# Patient Record
Sex: Female | Born: 1935 | Hispanic: No | Smoking: Never smoker
Health system: Southern US, Community
[De-identification: ages and names within clinical notes are randomized; demographics above are authoritative.]

## PROBLEM LIST (undated history)

## (undated) DIAGNOSIS — T8859XA Other complications of anesthesia, initial encounter: Secondary | ICD-10-CM

## (undated) DIAGNOSIS — M810 Age-related osteoporosis without current pathological fracture: Secondary | ICD-10-CM

## (undated) DIAGNOSIS — S72009A Fracture of unspecified part of neck of unspecified femur, initial encounter for closed fracture: Secondary | ICD-10-CM

## (undated) DIAGNOSIS — T4145XA Adverse effect of unspecified anesthetic, initial encounter: Secondary | ICD-10-CM

## (undated) DIAGNOSIS — H269 Unspecified cataract: Secondary | ICD-10-CM

## (undated) DIAGNOSIS — I2510 Atherosclerotic heart disease of native coronary artery without angina pectoris: Secondary | ICD-10-CM

## (undated) DIAGNOSIS — I639 Cerebral infarction, unspecified: Secondary | ICD-10-CM

## (undated) DIAGNOSIS — I10 Essential (primary) hypertension: Secondary | ICD-10-CM

## (undated) HISTORY — PX: SHOULDER HARDWARE REMOVAL: SUR1131

## (undated) HISTORY — DX: Unspecified cataract: H26.9

## (undated) HISTORY — PX: FRACTURE SURGERY: SHX138

## (undated) HISTORY — PX: TOTAL HIP ARTHROPLASTY: SHX124

## (undated) HISTORY — PX: SHOULDER HEMI-ARTHROPLASTY: SHX5049

## (undated) HISTORY — DX: Cerebral infarction, unspecified: I63.9

---

## 2014-03-08 ENCOUNTER — Encounter (HOSPITAL_COMMUNITY): Payer: Self-pay | Admitting: *Deleted

## 2014-03-08 ENCOUNTER — Inpatient Hospital Stay (HOSPITAL_COMMUNITY)
Admission: EM | Admit: 2014-03-08 | Discharge: 2014-03-09 | DRG: 065 | Disposition: A | Discharge status: Home Health | Payer: Self-pay | Source: Other Acute Inpatient Hospital | Attending: Internal Medicine | Admitting: Internal Medicine

## 2014-03-08 DIAGNOSIS — H532 Diplopia: Secondary | ICD-10-CM | POA: Diagnosis present

## 2014-03-08 DIAGNOSIS — Z8249 Family history of ischemic heart disease and other diseases of the circulatory system: Secondary | ICD-10-CM

## 2014-03-08 DIAGNOSIS — Z823 Family history of stroke: Secondary | ICD-10-CM

## 2014-03-08 DIAGNOSIS — Z96649 Presence of unspecified artificial hip joint: Secondary | ICD-10-CM

## 2014-03-08 DIAGNOSIS — I639 Cerebral infarction, unspecified: Secondary | ICD-10-CM | POA: Diagnosis present

## 2014-03-08 DIAGNOSIS — I6350 Cerebral infarction due to unspecified occlusion or stenosis of unspecified cerebral artery: Secondary | ICD-10-CM

## 2014-03-08 DIAGNOSIS — I10 Essential (primary) hypertension: Secondary | ICD-10-CM | POA: Diagnosis present

## 2014-03-08 DIAGNOSIS — G8190 Hemiplegia, unspecified affecting unspecified side: Secondary | ICD-10-CM | POA: Diagnosis present

## 2014-03-08 DIAGNOSIS — Z8673 Personal history of transient ischemic attack (TIA), and cerebral infarction without residual deficits: Secondary | ICD-10-CM

## 2014-03-08 DIAGNOSIS — I6330 Cerebral infarction due to thrombosis of unspecified cerebral artery: Principal | ICD-10-CM | POA: Diagnosis present

## 2014-03-08 DIAGNOSIS — M810 Age-related osteoporosis without current pathological fracture: Secondary | ICD-10-CM | POA: Diagnosis present

## 2014-03-08 DIAGNOSIS — R4701 Aphasia: Secondary | ICD-10-CM | POA: Diagnosis present

## 2014-03-08 DIAGNOSIS — R29810 Facial weakness: Secondary | ICD-10-CM | POA: Diagnosis present

## 2014-03-08 HISTORY — DX: Essential (primary) hypertension: I10

## 2014-03-08 HISTORY — DX: Age-related osteoporosis without current pathological fracture: M81.0

## 2014-03-08 MED ORDER — SENNOSIDES-DOCUSATE SODIUM 8.6-50 MG PO TABS: 1.0000 | ORAL_TABLET | Freq: Every evening | ORAL | Status: DC | PRN

## 2014-03-08 MED ORDER — ASPIRIN 325 MG PO TABS
325.0000 mg | ORAL_TABLET | Freq: Every day | ORAL | Status: DC
  Administered 2014-03-09: 325 mg via ORAL
  Filled 2014-03-08: qty 1

## 2014-03-08 MED ORDER — ASPIRIN 300 MG RE SUPP
300.0000 mg | Freq: Every day | RECTAL | Status: DC
  Filled 2014-03-08: qty 1

## 2014-03-08 MED ORDER — ENOXAPARIN SODIUM 30 MG/0.3ML ~~LOC~~ SOLN
30.0000 mg | SUBCUTANEOUS | Status: DC
  Filled 2014-03-08: qty 0.3

## 2014-03-08 MED ORDER — HYDRALAZINE HCL 20 MG/ML IJ SOLN: 5.0000 mg | Freq: Four times a day (QID) | INTRAMUSCULAR | Status: DC | PRN

## 2014-03-08 NOTE — H&P (Signed)
[facility] History and Physical  Ileanna Castaneda KGM:[id number] DOB: 01-18-1928 DOA: 03/08/2014  Referring physician: EDP PCP: No primary provider on file.  Specialists: Dr. Aram Beecham  Chief Complaint: Right Facial droop and Right Arm Weakness  HPI: Jill Castaneda is a 80 y.o. female with a history of HTN who began to have sudden onset of Right facial droop and right arm weakness at around 5 pm.  She was playing with her grand child and her daughter noticed that she had a facial droop and was leaning to her right side.   She had complaints of changes of vision in her right eye, and dizziness as well as headache.   She was taken to the ED at [facility] and has an evaluation and ct scan of the head which revealed a subacute left parietal lobe infarct, and old previous infarcts of the right frontal lobe and right cerebellum.   Neurology Dr Aram Beecham at [facility] was contacted by the EDP at [facility] as well as the [facility] and patient was transferred to [facility] for Further evaluation and rx.  She was not deemed a candidate for TPA, and was administered ASA Rx.      Her family is at bedside and assist with the medical history and translation; she is [place] speaking only.    Review of Systems:  Constitutional: No Weight Loss, No Weight Gain, Night Sweats, Fevers, Chills, Fatigue, or Generalized Weakness HEENT: +Headaches, Difficulty Swallowing,Tooth/Dental Problems,Sore Throat,  No Sneezing, Rhinitis, Ear Ache, Nasal Congestion, or Post Nasal Drip,  Cardio-vascular:  No Chest pain, Orthopnea, PND, Edema in lower extremities, Anasarca, Dizziness, Palpitations  Resp: No Dyspnea, No DOE, No Cough, No Hemoptysis, No Wheezing.    GI: No Heartburn, Indigestion, Abdominal Pain, Nausea, Vomiting, Diarrhea, Change in Bowel Habits,  +Loss of Appetite  GU: No Dysuria, Change in Color of Urine, No Urgency or Frequency.  No flank pain.  Musculoskeletal: No Joint Pain or Swelling.  No  Decreased Range of Motion. No Back Pain.  Neurologic: No Syncope, No Seizures, +Right Facial and Right Arm Weakness, Paresthesia, +Vision Disturbance or Loss, No Diplopia, +Dizziness,   No Difficulty Walking,  Skin: No Rash or Lesions. Psych: No Change in Mood or Affect. No Depression or Anxiety. No Memory loss. No Confusion or Hallucinations   Past Medical History  Diagnosis Date  . Hypertension   . Osteoporosis       Past Surgical History  Procedure Laterality Date  . Total hip arthroplasty Right   . Shoulder hemi-arthroplasty Left   . Shoulder hardware removal Left      Medications:    Lisinopril-  Verify dosage.      No Known Allergies   Social History:  reports that she has never smoked. She does not have any smokeless tobacco history on file. She reports that she does not drink alcohol. Her drug history is not on file.     Family History  Problem Relation Age of Onset  . Hypertension Father   . Stroke Father        Physical Exam:  GEN:  Pleasant Elderly Obese 80 y.o. Caucasian female  examined  and in no acute distress; cooperative with exam Filed Vitals:   03/08/14 2200 03/08/14 2241  BP:  174/74  Pulse:  53  Temp: 97.7 F (36.5 C) 97.7 F (36.5 C)  TempSrc:  Oral  Resp:  16  SpO2:  96%   Blood pressure 174/74, pulse 53, temperature 97.7 F (36.5 C),  temperature source Oral, resp. rate 16, SpO2 96.00%. PSYCH: She is alert and oriented x4; does not appear anxious does not appear depressed; affect is normal HEENT: Normocephalic and Atraumatic,  + Right FAcial Droop,   Mucous membranes pink; PERRLA; EOM intact; Fundi:  Benign;  No scleral icterus, Nares: Patent, Oropharynx: Clear, Edentulous with Dentures present, Neck:  FROM, no cervical lymphadenopathy nor thyromegaly or carotid bruit; no JVD; Breasts:: Not examined CHEST WALL: No tenderness CHEST: Normal respiration, clear to auscultation bilaterally HEART: Regular rate and rhythm; no murmurs rubs or  gallops BACK: No kyphosis or scoliosis; no CVA tenderness ABDOMEN: Positive Bowel Sounds,  Obese, soft non-tender; no masses, no organomegaly, no pannus; no intertriginous candida. Rectal Exam: Not done EXTREMITIES: No cyanosis, clubbing or edema; no ulcerations. Genitalia: not examined PULSES: 2+ and symmetric SKIN: Normal hydration no rash or ulceration CNS:  Alert and Oriented x 4, + Right Sided Facial Droop,  +Right Upper Extremity weakness 4/5 Strength,    BLEs 5/5 Gait:  Not assessed.    Vascular: pulses palpable throughout    Labs on Admission: Done at [facility] ED    CBC:     WBC:  4.7, H/H= 13.6/40.9  PLTs= 414    BMET:   Na+= 142    K+= 4.6  Cl= 105  CO2= 26   BUN/Cr=   24/1.10  Glu= 91   Trop =  < 0.01    PT= 10.1    INR=  1.0   PTT =  27.5    CXR:   NAD, + left humoral Mass( ? Sessile Exophytic bony area)     Radiological Exams on Admission:   Done at [facility]      EKG: Independently reviewed. Normal Sinus Rhythm No Acute S-T changes rate 67    Assessment/Plan:   80 y.o. female with  Principal Problem:   CVA (cerebral infarction) Active Problems:   Unspecified essential hypertension   1.  CVA- on Ct scan (subacute of Left Parietal lobe)-  transferred to [facility] for continued CVA evaluation.   Neuro: Dr Aram Beecham.   MRI/MRA and Carotid US and 2D ECHO in AM, Neuro checks, and PT.OT, and Speech consults in AM.    ASA Rx.    2.  HTN-  Monitor BPS,  IV Hydralazine PRN SBP > 160.     3.   DVT prophylaxis with Lovenox.    4.   Bony lesion seen on CXR of Left Shoulder-  Possible Surgical change versus Neoplastic Process,  Send for MRI of Left Shoulder.         Code Status:   FULL CODE Family Communication:    Family at Bedside Disposition Plan:   Inpatient      Time spent:  [facility] Pager [phone]  If 7PM-7AM, please contact night-coverage [email or web address] Password [facility] 03/08/2014, 11:32 PM

## 2014-03-09 ENCOUNTER — Inpatient Hospital Stay (HOSPITAL_COMMUNITY): Payer: Self-pay

## 2014-03-09 DIAGNOSIS — R29810 Facial weakness: Secondary | ICD-10-CM

## 2014-03-09 DIAGNOSIS — I6789 Other cerebrovascular disease: Secondary | ICD-10-CM

## 2014-03-09 DIAGNOSIS — R4701 Aphasia: Secondary | ICD-10-CM

## 2014-03-09 DIAGNOSIS — I6350 Cerebral infarction due to unspecified occlusion or stenosis of unspecified cerebral artery: Secondary | ICD-10-CM

## 2014-03-09 LAB — LIPID PANEL
Cholesterol: 160 mg/dL (ref 0–200)
HDL: 58 mg/dL (ref >39)
LDL Cholesterol: 88 mg/dL (ref 0–99)
Total CHOL/HDL Ratio: 2.8 RATIO
Triglycerides: 70 mg/dL (ref <150)
VLDL: 14 mg/dL (ref 0–40)

## 2014-03-09 MED ORDER — HYDRALAZINE HCL 25 MG PO TABS: 25.0000 mg | ORAL_TABLET | Freq: Three times a day (TID) | ORAL | Status: DC

## 2014-03-09 MED ORDER — LISINOPRIL 2.5 MG PO TABS: 2.5000 mg | ORAL_TABLET | Freq: Every day | ORAL | Status: DC

## 2014-03-09 MED ORDER — ASPIRIN 81 MG PO CHEW: 81.0000 mg | CHEWABLE_TABLET | Freq: Every day | ORAL | Status: DC

## 2014-03-09 MED ORDER — ASPIRIN EC 81 MG PO TBEC: 81.0000 mg | DELAYED_RELEASE_TABLET | Freq: Every day | ORAL | Status: DC

## 2014-03-09 NOTE — Progress Notes (Addendum)
Occupational Therapy Evaluation Patient Details Name: Jill Castaneda MRN: [id number] DOB: 1928-02-23 Today's Date: 03/09/2014    History of Present Illness  80 y.o. female with a history of HTN who began to have sudden onset of Right facial droop and right arm weakness at around 5 pm.   Left paramedian midbrain and medial left thalamic acute  nonhemorrhagic infarct. This likely affects the cortical spinal  tracts as well as the MLF, corresponding to the patient's diplopia.  The third nerve nucleus may be affected is well, accounting for the  patient's ptosis.    Clinical Impression   PTA, pt independent with ADL and mobility. Lived with family. Presents with RUE ataxia, postural instability and horizontal diplopia with L eye ptopsis. Pt trying to DC home today. Pt would benefit from CIR due to deficits listed below, however, family wanting to take pt home.  Will see additional session to educate family on appropriate care, HEP and home safety with mobility for ADL. Pt needs youth RW and 3 in ! For D/C home.    Follow Up Recommendations  Recommend CIR, but family taking pt home, therefore recommend Outpatient OT;Supervision/Assistance - direct 24 hour    Equipment Recommendations  3 in 1 bedside comode    Recommendations for Other Services       Precautions / Restrictions Precautions Precautions: Fall      Mobility Bed Mobility Overal bed mobility: Needs Assistance Bed Mobility: Supine to Sit;Sit to Supine     Supine to sit: Min guard Sit to supine: Min guard      Transfers Overall transfer level: Needs assistance   Transfers: Sit to/from [facility] Sit to Stand: Min assist Stand pivot transfers: Mod assist (when fatigued)       General transfer comment: cues for hand placement; minimal lifting and forward assist    Balance Overall balance assessment: Needs assistance Sitting-balance support: Feet supported;No upper extremity supported Sitting  balance-Leahy Scale: Fair     Standing balance support: During functional activity;Single extremity supported Standing balance-Leahy Scale: Poor                              ADL Overall ADL's : Needs assistance/impaired Eating/Feeding: Set up;Supervision/ safety   Grooming: Minimal assistance;Sitting   Upper Body Bathing: Minimal assitance   Lower Body Bathing: Moderate assistance;Sit to/from stand   Upper Body Dressing : Moderate assistance;Sitting   Lower Body Dressing: Moderate assistance;Sit to/from stand   Toilet Transfer: Ambulation;BSC;Moderate assistance           Functional mobility during ADLs: Moderate assistance;Rolling walker;Cueing for sequencing;Cueing for safety       Vision Eye Alignment: Impaired (comment) Alignment/Gaze Preference: Head turned Ocular Range of Motion: Restricted on the left Tracking/Visual Pursuits: Left eye does not track medially;Decreased smoothness of horizontal tracking;Decreased smoothness of vertical tracking Saccades: Additional head turns occurred during testing;Decreased speed of saccadic movement Convergence: Impaired (comment) Diplopia Assessment: Objects split side to side;Only with right gaze Depth Perception: Overshoots Additional Comments: has L eye taped   Perception     Praxis      Pertinent Vitals/Pain no apparent distress      Hand Dominance Right   Extremity/Trunk Assessment Upper Extremity Assessment Upper Extremity Assessment: RUE deficits/detail RUE Deficits / Details: ataxia. coordiantion diffficulties RUE Coordination: decreased fine motor;decreased gross motor   Lower Extremity Assessment Lower Extremity Assessment: Overall WFL for tasks assessed;RLE deficits/detail RLE Coordination: decreased fine motor  Cervical / Trunk Assessment Cervical / Trunk Assessment: Other exceptions Cervical / Trunk Exceptions: decreased postural control R bias   Communication  Communication Communication: No difficulties;Prefers language other than English   Cognition Arousal/Alertness: Awake/alert Behavior During Therapy: WFL for tasks assessed/performed Overall Cognitive Status: Within Functional Limits for tasks assessed                     General Comments       Exercises       Shoulder Instructions      Home Living Family/patient expects to be discharged to:: Private residence Living Arrangements: Other (Comment) (staying with her grand daughter for 3 months) Available Help at Discharge: Family;Friend(s) Type of Home: [place]: One level     Bathroom Shower/Tub: Tub/shower unit Shower/tub characteristics: Architectural technologist: Standard Bathroom Accessibility: Yes How Accessible: Accessible via walker Home Equipment: None          Prior Functioning/Environment Level of Independence: Independent             OT Diagnosis: Generalized weakness;Disturbance of vision;Ataxia   OT Problem List: Decreased strength;Decreased activity tolerance;Impaired balance (sitting and/or standing);Impaired vision/perception;Decreased coordination;Impaired UE functional use   OT Treatment/Interventions: Self-care/ADL training;Therapeutic exercise;Neuromuscular education;DME and/or AE instruction;Therapeutic activities;Visual/perceptual remediation/compensation;Patient/family education;Balance training    OT Goals(Current goals can be found in the care plan section) Acute Rehab OT Goals Patient Stated Goal: Get back to her normal OT Goal Formulation: With patient Time For Goal Achievement: 03/23/14 Potential to Achieve Goals: Good  OT Frequency: Min 2X/week   Barriers to D/C:            Co-evaluation              End of Session Equipment Utilized During Treatment: Gait belt;Rolling walker Nurse Communication: Mobility status;Other (comment) (need for DME)  Activity Tolerance: Patient tolerated treatment  well Patient left: in bed;with call bell/phone within reach;with family/visitor present   Time: 1610-1640 OT Time Calculation (min): 30 min Charges:  OT General Charges $OT Visit: 1 Procedure OT Evaluation $Initial OT Evaluation Tier I: 1 Procedure OT Treatments $Self Care/Home Management : 8-22 mins G-Codes:   Jill Castaneda, OTR/L  [phone] 03/09/2014 Jill Castaneda 03/09/2014, 5:46 PM

## 2014-03-09 NOTE — Progress Notes (Signed)
Family declined speech therapy evaluation prior to discharge.

## 2014-03-09 NOTE — Discharge Instructions (Signed)
Follow with Primary MD  in 7 days   Get CBC, CMP, checked 7 days by Primary MD and again as instructed by your Primary MD.  Get A1c checked in 2 months.   Activity: As tolerated with Full fall precautions use walker/cane & assistance as needed   Disposition Home     Diet: Heart Healthy Low Carb with feeding assistance and aspiration precautions if needed.  For Heart failure patients - Check your Weight same time everyday, if you gain over 2 pounds, or you develop in leg swelling, experience more shortness of breath or chest pain, call your Primary MD immediately. Follow Cardiac Low Salt Diet and 1.8 lit/day fluid restriction.   On your next visit with her primary care physician please Get Medicines reviewed and adjusted.  Please request your Prim.MD to go over all Hospital Tests and Procedure/Radiological results at the follow up, please get all Hospital records sent to your Prim MD by signing hospital release before you go home.   If you experience worsening of your admission symptoms, develop shortness of breath, life threatening emergency, suicidal or homicidal thoughts you must seek medical attention immediately by calling 911 or calling your MD immediately  if symptoms less severe.  You Must read complete instructions/literature along with all the possible adverse reactions/side effects for all the Medicines you take and that have been prescribed to you. Take any new Medicines after you have completely understood and accpet all the possible adverse reactions/side effects.   Do not drive and provide baby sitting services if your were admitted for syncope or siezures until you have seen by Primary MD or a Neurologist and advised to do so again.  Do not drive when taking Pain medications.    Do not take more than prescribed Pain, Sleep and Anxiety Medications  Special Instructions: If you have smoked or chewed Tobacco  in the last 2 yrs please stop smoking, stop any regular Alcohol   and or any Recreational drug use.  Wear Seat belts while driving.   Please note  You were cared for by a hospitalist during your hospital stay. If you have any questions about your discharge medications or the care you received while you were in the hospital after you are discharged, you can call the unit and asked to speak with the hospitalist on call if the hospitalist that took care of you is not available. Once you are discharged, your primary care physician will handle any further medical issues. Please note that NO REFILLS for any discharge medications will be authorized once you are discharged, as it is imperative that you return to your primary care physician (or establish a relationship with a primary care physician if you do not have one) for your aftercare needs so that they can reassess your need for medications and monitor your lab values.

## 2014-03-09 NOTE — Progress Notes (Signed)
  Echocardiogram 2D Echocardiogram has been performed.  Jill Castaneda 03/09/2014, 6:31 PM

## 2014-03-09 NOTE — Consult Note (Signed)
Referring Physician: Marlana Castaneda    Chief Complaint: right hemiparesis, aphasia, right face weakness  HPI:                                                                                                                                         Jill Castaneda is an 80 y.o. female with a past medical history significant for HTN, osteoporosis, transferred to [facility] for further management of the above stated symptoms. She was initially taken by ambulance to [place] and subsequently transferred to [facility]. Patient doesn't speak English but family is at bedside and they told me that she was last known well at 430 pm yesterday: they were returning back home from the beach, stopped to get food at a restaurant and while standing she complained of having visual problems in the right eye and ambulance was summoned. One of her family members said that when ambulance arrived and did the examination she was able to move all her limbs and was able to follow commands. They expressed that Jill Castaneda developed right sided weakness while in the ED. Review of [facility] records indicate that she had NIHSS 17 and significantly elevated SBP>200.  CT brain is reported as showing evidence of low attenuation in the left parietal lobe suspicious for a subacute infarct. ER physician told me that they contacted the neurologist at that institution and the patient was deemed not eligible for thrombolysis.   At this moment, she is able to follow commands and answer questions but " is making no sense". Furthermore, family said that her motor strength in the right side is also much improved. Denies HA, vertigo, but complains of double vision.   Date last known well: 03/08/14 Time last known well: 430 pm tPA Given: no, decision was made by neurologist at [facility] " because of CT showing subacute stroke". NIHSS: 17 at [facility] ED MRS:   Past Medical History  Diagnosis Date  . Hypertension   . Osteoporosis      Past Surgical History  Procedure Laterality Date  . Total hip arthroplasty Right   . Shoulder hemi-arthroplasty Left   . Shoulder hardware removal Left     Family History  Problem Relation Age of Onset  . Hypertension Father   . Stroke Father   . Hypertension Sister    Social History:  reports that she has never smoked. She does not have any smokeless tobacco history on file. She reports that she does not drink alcohol. Her drug history is not on file.  Allergies: No Known Allergies  Medications:  I have reviewed the patient's current medications.  ROS:                                                                                                                                       History obtained from chart review and family, as patient speaks no English  General ROS: negative for - chills, fatigue, fever, night sweats, weight gain or weight loss Psychological ROS: negative for - behavioral disorder, hallucinations, memory difficulties, mood swings or suicidal ideation Ophthalmic ROS: significant for double vision ENT ROS: negative for - epistaxis, nasal discharge, oral lesions, sore throat, tinnitus or vertigo Allergy and Immunology ROS: negative for - hives or itchy/watery eyes Hematological and Lymphatic ROS: negative for - bleeding problems, bruising or swollen lymph nodes Endocrine ROS: negative for - galactorrhea, hair pattern changes, polydipsia/polyuria or temperature intolerance Respiratory ROS: negative for - cough, hemoptysis, shortness of breath or wheezing Cardiovascular ROS: negative for - chest pain, dyspnea on exertion, edema or irregular heartbeat Gastrointestinal ROS: negative for - abdominal pain, diarrhea, hematemesis, nausea/vomiting or stool incontinence Genito-Urinary ROS: negative for - dysuria, hematuria, incontinence or urinary  frequency/urgency Musculoskeletal ROS: negative for - joint swelling Neurological ROS: as noted in HPI Dermatological ROS: negative for rash and skin lesion changes  Physical exam: pleasant female in no apparent distress. Blood pressure 147/76, pulse 55, temperature 97.7 F (36.5 C), temperature source Oral, resp. rate 16, SpO2 95.00%. Head: normocephalic. Neck: supple, no bruits, no JVD. Cardiac: no murmurs. Lungs: clear. Abdomen: soft, no tender, no mass. Extremities: no edema. CV: pulses palpable throughout  Neurologic Examination:                                                                                                      Mental Status: Alert, awake, disoriented to year-month-place.  Speech fluent without evidence of aphasia.  Although there is a language barrier, she seems to be able to follow 3rd step commands without difficulty. Cranial Nerves: II: Discs flat bilaterally; Visual fields grossly normal, pupils equal, round, reactive to light and accommodation III,IV, VI: ptosis not present, seems to have a dysconjugate gaze with mild paresis left medial rectus.   V,VII: smile asymmetric due to subtle right lower face weakness, facial light touch sensation normal bilaterally VIII: hearing normal bilaterally IX,X: gag reflex present XI: bilateral shoulder shrug XII: midline tongue extension without atrophy or fasciculations Motor: Mild weakness right arm. Tone and bulk:normal tone throughout; no atrophy noted Sensory: Pinprick and light touch intact  throughout, bilaterally Deep Tendon Reflexes:  2 all over Plantars: Right: downgoing   Left: downgoing Cerebellar: normal finger-to-nose,  normal heel-to-shin test in the left. Can not perform FTN in the right. Gait: no tested.   Assessment: 80 y.o. female transferred to [facility] as per family request. Her neurological syndrome (right hemiparesis, right face weakness, double vision, dysconjugate gaze with weakness left medial  rectus) appears to be more consistent with a brainstem stroke, although CT brain is pinpointing to a left parietal subacute infarct. She was considered no a thrombolysis candidate by neurology at [facility]. In any case, she seems to significantly improved at this moment. Complete stroke work up. She already passed screening swallow evaluation, thus aspirin 81 mg daily pending results stroke work up. Stroke team will resume care tomorrow.  Stroke Risk Factors - age, HTN  Plan: 1. HgbA1c, fasting lipid panel 2. MRI, MRA  of the brain without contrast 3. Echocardiogram 4. Carotid dopplers 5. Prophylactic therapy-aspirin 81 mg daily 6. Risk factor modification 7. Telemetry monitoring 8. Frequent neuro checks 9. PT/OT SLP  Dorian Pod, MD [facility] [phone]  03/09/2014, 12:29 AM

## 2014-03-09 NOTE — Progress Notes (Signed)
Physical Therapy Evaluation Patient Details Name: Jill Castaneda MRN: [id number] DOB: Mar 10, 1928 Today's Date: 03/09/2014   History of Present Illness   Jill Castaneda is a 80 y.o. female with a history of HTN who began to have sudden onset of Right facial droop and right arm weakness at around 5 pm.  She was playing with her grand child and her daughter noticed that she had a facial droop and was leaning to her right side.   She had complaints of changes of vision in her right eye, and dizziness as well as headache.   She was taken to the ED at [facility] and has an evaluation and ct scan of the head which revealed a subacute left parietal lobe infarct, and old previous infarcts of the right frontal lobe and right cerebellum.   Clinical Impression  Pt admitted with/for CVA.  Pt currently limited functionally due to the problems listed below.  (see problems list.)  Pt will benefit from PT to maximize function and safety to be able to get home safely with available assist of family.     Follow Up Recommendations Home health PT (Family will likely handle therapy needs themselves)    Equipment Recommendations  Rolling walker with 5" wheels    Recommendations for Other Services       Precautions / Restrictions Precautions Precautions: Fall      Mobility  Bed Mobility                  Transfers Overall transfer level: Needs assistance   Transfers: Sit to/from Stand;Stand Pivot Transfers Sit to Stand: Min assist Stand pivot transfers: Min assist       General transfer comment: cues for hand placement; minimal lifting and forward assist  Ambulation/Gait Ambulation/Gait assistance: Min assist;Mod assist Ambulation Distance (Feet): 120 Feet Assistive device: 1 person hand held assist Gait Pattern/deviations: Step-through pattern;Decreased step length - right;Decreased step length - left;Decreased stride length;Decreased weight shift to left Gait velocity:  slower      Stairs            Wheelchair Mobility    Modified Rankin (Stroke Patients Only) Modified Rankin (Stroke Patients Only) Pre-Morbid Rankin Score: No symptoms Modified Rankin: Severe disability     Balance Overall balance assessment: Needs assistance Sitting-balance support: Feet supported;No upper extremity supported Sitting balance-Leahy Scale: Fair     Standing balance support: During functional activity;Single extremity supported Standing balance-Leahy Scale: Poor                               Pertinent Vitals/Pain       Home Living Family/patient expects to be discharged to:: Private residence Living Arrangements: Other (Comment) (staying with her grand daughter for 3 months) Available Help at Discharge: Family;Friend(s) Type of Home: House       Home Layout: One level        Prior Function Level of Independence: Independent               Hand Dominance        Extremity/Trunk Assessment   Upper Extremity Assessment: Defer to OT evaluation           Lower Extremity Assessment: Overall WFL for tasks assessed;RLE deficits/detail         Communication   Communication: No difficulties;Prefers language other than English  Cognition Arousal/Alertness: Awake/alert Behavior During Therapy: WFL for tasks assessed/performed Overall Cognitive Status: Within Functional Limits  for tasks assessed                      General Comments      Exercises        Assessment/Plan    PT Assessment Patient needs continued PT services  PT Diagnosis Difficulty walking;Abnormality of gait (R hemiparesis)   PT Problem List Decreased strength;Decreased activity tolerance;Decreased balance;Decreased mobility;Decreased knowledge of use of DME;Decreased knowledge of precautions  PT Treatment Interventions     PT Goals (Current goals can be found in the Care Plan section) Acute Rehab PT Goals Patient Stated Goal: Get back  to her normal PT Goal Formulation: No goals set, d/c therapy (Pt needs to do therapy outside hospital to save costs)    Frequency     Barriers to discharge        Co-evaluation               End of Session   Activity Tolerance: Patient tolerated treatment well Patient left: in chair;with call bell/phone within reach;with family/visitor present Nurse Communication: Mobility status         Time: 1497-0263 PT Time Calculation (min): 26 min   Charges:   PT Evaluation $Initial PT Evaluation Tier I: 1 Procedure PT Treatments $Gait Training: 8-22 mins   PT G CodesTessie Fass Mickell Birdwell 03/09/2014, 4:56 PM  03/09/2014  Donnella Sham, PT [phone]  (pager)

## 2014-03-09 NOTE — Discharge Summary (Signed)
Jill Castaneda, is a 80 y.o. female  DOB January 29, 1928  MRN [id number].  Admission date:  03/08/2014  Admitting Physician  Phillips Grout, MD  Discharge Date:  03/09/2014   Primary MD  No primary provider on file.  Recommendations for primary care physician for things to follow:   Monitor secondary risk factors for stroke   Admission Diagnosis  STROKE WEAKNESS   Discharge Diagnosis  STROKE WEAKNESS   Principal Problem:   CVA (cerebral infarction) Active Problems:   Unspecified essential hypertension      Past Medical History  Diagnosis Date  . Hypertension   . Osteoporosis     Past Surgical History  Procedure Laterality Date  . Total hip arthroplasty Right   . Shoulder hemi-arthroplasty Left   . Shoulder hardware removal Left      Discharge Condition: Stable   Follow UP  Follow-up Information   Follow up with [facility]    . Schedule an appointment as soon as possible for a visit in 1 week.   Contact information:   [place] [address] [phone]      Follow up with [facility]. Schedule an appointment as soon as possible for a visit in 1 month.   Contact information:   [address]     [place]  [address] [phone]        Discharge Instructions  and  Discharge Medications          Discharge Orders   Future Orders Complete By Expires   Discharge instructions  As directed    Scheduling Instructions:   Follow with Primary MD  in 7 days   Get CBC, CMP, checked 7 days by Primary MD and again as instructed by your Primary MD.  Get A1c checked in 2 months.   Activity: As tolerated with Full fall precautions use walker/cane & assistance as needed   Disposition Home     Diet: Heart Healthy Low Carb  with feeding assistance and aspiration precautions if needed.  For Heart failure patients - Check your Weight same time everyday, if you gain over 2 pounds, or you develop in leg swelling, experience more shortness of breath or chest pain, call your Primary MD immediately. Follow Cardiac Low Salt Diet and 1.8 lit/day fluid restriction.   On your next visit with her primary care physician please Get Medicines reviewed and adjusted.  Please request your Prim.MD to go over all Hospital Tests and Procedure/Radiological results at the follow up, please get all Hospital records sent to your Prim MD by signing hospital release before you go home.   If you experience worsening of your admission symptoms, develop shortness of breath, life threatening emergency, suicidal or homicidal thoughts you must seek medical attention immediately by calling 911 or calling your MD immediately  if symptoms less severe.  You Must read complete instructions/literature along with all the possible adverse reactions/side effects for all the Medicines you take and that have been  prescribed to you. Take any new Medicines after you have completely understood and accpet all the possible adverse reactions/side effects.   Do not drive and provide baby sitting services if your were admitted for syncope or siezures until you have seen by Primary MD or a Neurologist and advised to do so again.  Do not drive when taking Pain medications.    Do not take more than prescribed Pain, Sleep and Anxiety Medications  Special Instructions: If you have smoked or chewed Tobacco  in the last 2 yrs please stop smoking, stop any regular Alcohol  and or any Recreational drug use.  Wear Seat belts while driving.   Please note  You were cared for by a hospitalist during your hospital stay. If you have any questions about your discharge medications or the care you received while you were in the hospital after you are discharged, you can call  the unit and asked to speak with the hospitalist on call if the hospitalist that took care of you is not available. Once you are discharged, your primary care physician will handle any further medical issues. Please note that NO REFILLS for any discharge medications will be authorized once you are discharged, as it is imperative that you return to your primary care physician (or establish a relationship with a primary care physician if you do not have one) for your aftercare needs so that they can reassess your need for medications and monitor your lab values.   Increase activity slowly  As directed        Medication List         ASPIRIN PO  Take 1 tablet by mouth daily.     aspirin EC 81 MG tablet  Take 1 tablet (81 mg total) by mouth daily.     CALCIUM + D PO  Take 1 tablet by mouth daily.     GARLIC PO  Take 1 capsule by mouth daily.     hydrALAZINE 25 MG tablet  Commonly known as:  APRESOLINE  Take 1 tablet (25 mg total) by mouth 3 (three) times daily.     lisinopril 2.5 MG tablet  Commonly known as:  PRINIVIL,ZESTRIL  Take 1 tablet (2.5 mg total) by mouth daily.     multivitamin with minerals Tabs tablet  Take 1 tablet by mouth every 3 (three) days.          Diet and Activity recommendation: See Discharge Instructions above   Consults obtained - neurology   Major procedures and Radiology Reports - PLEASE review detailed and final reports for all details, in brief -   Echocardiogram  Left ventricle: The cavity size was normal. Wall thickness was normal. Systolic function was normal. The estimated ejection fraction was in the range of 60% to 65%. Wall motion was normal; there were no regional wall motion abnormalities. There was an increased relative contribution of atrial contraction to ventricular filling.      Carotid ultrasound  Preliminary report: 1-39% ICA stenosis. Vertebral artery flow is antegrade.    MRI MRA brain  1. Left paramedian midbrain  and medial left thalamic acute  nonhemorrhagic infarct. This likely affects the cortical spinal  tracts as well as the MLF, corresponding to the patient's diplopia.  The third nerve nucleus may be affected is well, accounting for the  patient's ptosis.  2. Remote infarcts involving the left parietal lobe, anterior right  frontal lobe, and right cerebellum as the noted on CT.  3. Atrophy and diffuse  white matter disease likely reflects the  sequelae of chronic microvascular ischemia.  4. The MRA demonstrates moderate branch vessel attenuation without a  significant proximal stenosis, aneurysm, or branch vessel occlusion.  5. Tortuosity of the cervical internal carotid arteries and  vertebral arteries bilaterally is nonspecific, but commonly seen in  the setting of chronic hypertension.    No results found.  Micro Results      No results found for this or any previous visit (from the past 240 hour(s)).   History of present illness and  Hospital Course:     Kindly see H&P for history of present illness and admission details, please review complete Labs, Consult reports and Test reports for all details in brief Jill Castaneda, is a 80 y.o. female, who is visiting family here from [place] with history of  hypertension, no other medical problems per family, who developed right-sided weakness along with right-sided facial droop and left ptosis yesterday and was initially brought to [facility] where a head CT scan revealed an ischemic subacute left parietal lobe infarct and she was then subsequently transferred to [facility] for further care. She was not deemed to be a TPA candidate at [place] showed . Left paramedian midbrain and medial left thalamic acute nonhemorrhagic infarct - This morning her symptoms have almost completely resolved and her right side strength is between 4/5-5 over 5, she still has left sided ptosis was in some diplopia, diplopia disappears when she  closes her left eye, she has been placed on aspirin, will place her on hydralazine for hypertension from tomorrow, her LDL was under 100, she will get home PT long with OT upon discharge, seen by neurology, kindly review full reports for MRI-MRA echo and carotid duplex. Echo done will call Patient with full report in am (will be read in am) family cannot wait another night for speech eval and want to leave as she is doing well speech/swallowing wise.    Will ask her to follow closely with PCP as she is in pre diabetic range.     Today   Subjective:   Jill Castaneda today has no headache,no chest abdominal pain,no new weakness tingling or numbness, feels much better wants to go home today.   Objective:   Blood pressure 136/73, pulse 54, temperature 98.3 F (36.8 C), temperature source Axillary, resp. rate 20, weight 44.453 kg (98 lb), SpO2 94.00%.  No intake or output data in the 24 hours ending 03/09/14 1705  Exam Awake Alert, Oriented *3, No new F.N deficits, Normal affect, right-sided strength between 4/5 - 5/5 [place].AT,PERRAL, L ptosis Supple Neck,No JVD, No cervical lymphadenopathy appriciated.  Symmetrical Chest wall movement, Good air movement bilaterally, CTAB RRR,No Gallops,Rubs or new Murmurs, No Parasternal Heave +ve B.Sounds, Abd Soft, Non tender, No organomegaly appriciated, No rebound -guarding or rigidity. No Cyanosis, Clubbing or edema, No new Rash or bruise  Data Review labs from yesterday [facility] are unremarkable.    Lab Results  Component Value Date   CHOL 160 03/09/2014   HDL 58 03/09/2014   LDLCALC 88 03/09/2014   TRIG 70 03/09/2014   CHOLHDL 2.8 03/09/2014    Lab Results  Component Value Date   HGBA1C 6.4* 03/09/2014     CBC w Diff:  No results found for this basename: wbc,  hgb,  hct,  plt,  POLY,  LYMPHOPCT,  BANDSPCT,  MONOPCT,  EOSPCT,  BASOPCT,  ATYLYMABS    CMP:  No results  found for this basename: NA,  K,  CL,  CO2,  BUN,  CREATININE,  GLU,   PROT,  ALBUMIN,  CA,  BILITOT,  ALKPHOS,  AST,  ALT,  GLO  .   Total Time in preparing paper work, data evaluation and todays exam - 35 minutes  Thurnell Lose M.D on 03/09/2014 at 5:05 PM  [facility] Office  [phone]

## 2014-03-09 NOTE — Progress Notes (Signed)
Stroke Team Progress Note  HISTORY Jill Castaneda is an 80 y.o. female with a past medical history significant for HTN, osteoporosis, transferred to [facility] for further management of right hemiparesis and aphasia. She was initially taken by ambulance to [place] and subsequently transferred to [facility].  Patient doesn't speak English but family is at bedside and they told me that she was last known well at 430 pm yesterday: they were [place] from the beach, stopped to get food at a restaurant and while standing she complained of having visual problems in the right eye and ambulance was summoned. One of her family members said that when ambulance arrived and did the examination she was able to move all her limbs and was able to follow commands.  They expressed that Jill Castaneda developed right sided weakness while in the ED.  Review of [facility] records indicate that she had NIHSS 17 and significantly elevated SBP>200.  CT brain is reported as showing evidence of low attenuation in the left parietal lobe suspicious for a subacute infarct. ER physician told me that they contacted the neurologist at that institution and the patient was deemed not eligible for thrombolysis.  At this moment, she is able to follow commands and answer questions but " is making no sense". Furthermore, family said that her motor strength in the right side is also much improved.  Denies HA, vertigo, but complains of double vision.    Date last known well: 03/08/14  Time last known well: 430 pm  tPA Given: no, decision was made by neurologist at [facility] " because of CT showing subacute stroke".  NIHSS: 17 at [facility] ED    SUBJECTIVE  There is a language barrier but the patient seemed to have improved right upper extremity strength. There is still significant diplopia. The family is at the bedside and the case was discussed at length with them. It appears that she was using her lisinopril on as  needed basis at home.  OBJECTIVE Most recent Vital Signs: Filed Vitals:   03/08/14 2301 03/09/14 0100 03/09/14 0300 03/09/14 0500  BP: 147/76 150/74 145/70 143/78  Pulse: 55 50 49 50  Temp: 97.7 F (36.5 C) 98 F (36.7 C) 98 F (36.7 C) 98 F (36.7 C)  TempSrc: Oral Oral Oral Oral  Resp: 16 18 20    Weight: 98 lb (44.453 kg)     SpO2: 95% 95% 94% 95%   CBG (last 3)  No results found for this basename: GLUCAP,  in the last 72 hours  IV Fluid Intake:     MEDICATIONS  . aspirin  300 mg Rectal Daily   Or  . aspirin  325 mg Oral Daily  . enoxaparin (LOVENOX) injection  30 mg Subcutaneous Q24H   PRN:  hydrALAZINE, senna-docusate  Diet:  Carb Control thin liquids Activity:  Up with assistance DVT Prophylaxis:  Lovenox  CLINICALLY SIGNIFICANT STUDIES Basic Metabolic Panel: No results found for this basename: NA, K, CL, CO2, GLUCOSE, BUN, CREATININE, CALCIUM, MG, PHOS,  in the last 168 hours Liver Function Tests: No results found for this basename: AST, ALT, ALKPHOS, BILITOT, PROT, ALBUMIN,  in the last 168 hours CBC: No results found for this basename: WBC, NEUTROABS, HGB, HCT, MCV, PLT,  in the last 168 hours Coagulation: No results found for this basename: LABPROT, INR,  in the last 168 hours Cardiac Enzymes: No results found for this basename: CKTOTAL, CKMB, CKMBINDEX, TROPONINI,  in the last 168 hours Urinalysis: No  results found for this basename: COLORURINE, APPERANCEUR, [place], [place], GLUCOSEU, HGBUR, BILIRUBINUR, KETONESUR, PROTEINUR, UROBILINOGEN, NITRITE, LEUKOCYTESUR,  in the last 168 hours Lipid Panel    Component Value Date/Time   CHOL 160 03/09/2014 0422   TRIG 70 03/09/2014 0422   HDL 58 03/09/2014 0422   CHOLHDL 2.8 03/09/2014 0422   VLDL 14 03/09/2014 0422   LDLCALC 88 03/09/2014 0422   HgbA1C  No results found for this basename: HGBA1C    Urine Drug Screen:   No results found for this basename: labopia, cocainscrnur, labbenz, amphetmu, thcu, labbarb     Alcohol Level: No results found for this basename: ETH,  in the last 168 hours  No results found.  CT of the brain    MRI of the brain   1. Left paramedian midbrain and medial left thalamic acute nonhemorrhagic infarct. This likely affects the cortical spinal tracts as well as the MLF, corresponding to the patient's diplopia. The third nerve nucleus may be affected is well, accounting for the patient's ptosis. 2. Remote infarcts involving the left parietal lobe, anterior right frontal lobe, and right cerebellum as the noted on CT. 3. Atrophy and diffuse white matter disease likely reflects the sequelae of chronic microvascular ischemia.   4. The MRA demonstrates moderate branch vessel attenuation without a significant proximal stenosis, aneurysm, or branch vessel occlusion. 5. Tortuosity of the cervical internal carotid arteries and vertebral arteries bilaterally is nonspecific, but commonly seen in the setting of chronic hypertension.    2D Echocardiogram  pending  Carotid Doppler  pending  CXR    EKG pending  For complete results please see formal report.   Therapy Recommendations pending  Physical Exam   Mental Status:  Alert, awake, disoriented to year-month-place. Speech fluent without evidence of aphasia. Although there is a language barrier, she seems to be able to follow 3rd step commands without difficulty.  Cranial Nerves:  II: Visual fields grossly normal, pupils equal, round, reactive to light and accommodation  III,IV, VI: ptosis not present, She has no upgaze; she can move the right eye about 80% over but she does not cross the left pupil to the right; she moves both pupil well to the left; downgaze seems fine  V,VII: smile asymmetric due to subtle right lower face weakness, facial light touch sensation normal bilaterally  VIII: hearing normal bilaterally  IX,X: gag reflex present  XI: bilateral shoulder shrug  XII: midline tongue extension without  atrophy or fasciculations  Motor:  Mild weakness right arm.  Tone and bulk:normal tone throughout; no atrophy noted  Sensory: Pinprick and light touch intact throughout, bilaterally  Deep Tendon Reflexes:  2 all over  Plantars:  Right: downgoing Left: downgoing  Cerebellar:  Finger to nose is normal on the left but the right shows mild dysmetria; heel-to-shin is normal bilaterally. Gait: not tested.   ASSESSMENT Jill Castaneda is a 80 y.o. female presenting with right hemiparesis, aphasia, and right facial weakness. TPA was not initiated by decision from neurologist at [facility]. MRI - Left paramedian midbrain and medial left thalamic acute nonhemorrhagic infarct. Infarct felt to be thrombotic secondary to small vessel disease. On no Antiplatelet agents prior to admission. Now on aspirin 81 mg daily for secondary stroke prevention. Patient with resultant right hemiparesis and confusion. Stroke work up underway.   Hypertension  Previous strokes    Hospital day # 1  TREATMENT/PLAN  Continue aspirin 81 mg daily for secondary stroke prevention.  Await therapy evaluations  Await  2-D echo carotid Dopplers  Blood pressure control. Use of lisinopril in a consistent manner and not when necessary.    Mikey Bussing PA-C [facility] Pager [phone] 03/09/2014, 8:20 AM  I have personally obtained a history, examined the patient, evaluated imaging results, and formulated the assessment and plan of care. I agree with the above.    To contact Stroke Continuity provider, please refer to [email or web address] After hours, contact General Neurology

## 2014-03-09 NOTE — Progress Notes (Signed)
Discharge instructions reviewed with patients Grand daughter. She verbalized understanding. Patient discharged home with belongings and equipment. Patient in stable condition.

## 2014-03-09 NOTE — Progress Notes (Signed)
OT Cancellation Note  Patient Details Name: Jill Castaneda MRN: [id number] DOB: [birth year]   Cancelled Treatment:    Reason Eval/Treat Not Completed: Patient at procedure or test/ unavailable Pt at MRI. Will return later. [place], [place]  [phone] 03/09/2014 03/09/2014, 11:42 AM

## 2014-03-09 NOTE — Progress Notes (Signed)
Occupational Therapy Treatment Patient Details Name: Jill Castaneda MRN: [id number] DOB: Aug 08, 1928 Today's Date: 03/09/2014    History of present illness  Jill Castaneda is a 80 y.o. female with a history of HTN who began to have sudden onset of Right facial droop and right arm weakness at around 5 pm.  She was playing with her grand child and her daughter noticed that she had a facial droop and was leaning to her right side.   She had complaints of changes of vision in her right eye, and dizziness as well as headache.   She was taken to the ED at [facility] and has an evaluation and ct scan of the head which revealed a subacute left parietal lobe infarct, and old previous infarcts of the right frontal lobe and right cerebellum.    OT comments  Completed education with pt/cargiver. Stressed importance of providing 24/7 assistance due to pt being such a high fall risk. Pt will need to continue with the neuro outpt center for OT/PT.  Follow Up Recommendations  Outpatient OT;Supervision/Assistance - 24 hour    Equipment Recommendations  3 in 1 bedside comode    Recommendations for Other Services      Precautions / Restrictions Precautions Precautions: Fall       Mobility Bed Mobility Overal bed mobility: Needs Assistance Bed Mobility: Supine to Sit;Sit to Supine     Supine to sit: Min guard Sit to supine: Min guard      Transfers Overall transfer level: Needs assistance   Transfers: Sit to/from [employer] Transfers Sit to Stand: Min assist Stand pivot transfers: Min assist       General transfer comment: cues for hand placement; minimal lifting and forward assist    Balance Overall balance assessment: Needs assistance Sitting-balance support: Feet supported;No upper extremity supported Sitting balance-Leahy Scale: Fair     Standing balance support: During functional activity;Single extremity supported Standing balance-Leahy Scale: Poor Standing  balance comment: R bias                   ADL Overall ADL's : Needs assistance/impaired Eating/Feeding: Set up;Supervision/ safety   Grooming: Minimal assistance;Sitting   Upper Body Bathing: Minimal assitance   Lower Body Bathing: Moderate assistance;Sit to/from stand   Upper Body Dressing : Moderate assistance;Sitting   Lower Body Dressing: Moderate assistance;Sit to/from stand   Toilet Transfer: Ambulation;BSC;Moderate assistance           Functional mobility during ADLs: Moderate assistance (Educated caregiver on handling techniques for pt and use ofr) General ADL Comments: Increased time spent with family on safe handling techniques to increase safety and decrese risk of falls. Rec family to use gait belt.. RW adjusted appropriately. Family able to mobilize pt with S oftherapist. Given recommendations to use visual cues to widen stance when walking due to pt trying to scissor at times. HEP for fine motor, coordiantion , strengthening and visual exercises reviewed with family. Written handouts reviewed.. Discussed need for family to provdie 24/7 assistance and need to either sleep in pt's rom or have monitor due to pt being such a high fall risk.       Vision Eye Alignment: Impaired (comment) Alignment/Gaze Preference: Head turned Ocular Range of Motion: Restricted on the left Tracking/Visual Pursuits: Left eye does not track medially;Decreased smoothness of horizontal tracking;Decreased smoothness of vertical tracking Saccades: Additional head turns occurred during testing;Decreased speed of saccadic movement Convergence: Impaired (comment) Diplopia Assessment: Objects split side to side;Only with right gaze  Depth Perception: Overshoots Additional Comments: Pt/caregiver verbalized understanding of need toalternate patching and educated family on visual exercises/activities to address diplopia.   Perception     Praxis      Cognition   Behavior During Therapy:  WFL for tasks assessed/performed Overall Cognitive Status: Within Functional Limits for tasks assessed                       Extremity/Trunk Assessment  Upper Extremity Assessment Upper Extremity Assessment: RUE deficits/detail RUE Deficits / Details: ataxia. coordiantion diffficulties RUE Coordination: decreased fine motor;decreased gross motor   Lower Extremity Assessment Lower Extremity Assessment: Overall WFL for tasks assessed;RLE deficits/detail RLE Coordination: decreased fine motor   Cervical / Trunk Assessment Cervical / Trunk Assessment: Other exceptions Cervical / Trunk Exceptions: decreased postural control R bias    Exercises     Shoulder Instructions       General Comments      Pertinent Vitals/ Pain       no apparent distress   Home Living Family/patient expects to be discharged to:: Private residence Living Arrangements: Other (Comment) (staying with her grand daughter for 3 months) Available Help at Discharge: Family;Friend(s) Type of Home: House       Home Layout: One level     Bathroom Shower/Tub: Tub/shower unit Shower/tub characteristics: Architectural technologist: Standard Bathroom Accessibility: Yes How Accessible: Accessible via walker Home Equipment: None          Prior Functioning/Environment Level of Independence: Independent            Frequency Min 2X/week     Progress Toward Goals  OT Goals(current goals can now be found in the care plan section)  Progress towards OT goals: Goals met/education completed, patient discharged from OT  Acute Rehab OT Goals Patient Stated Goal: Get back to her normal OT Goal Formulation: With patient Time For Goal Achievement: 03/23/14 Potential to Achieve Goals: Good ADL Goals Pt/caregiver will Perform Home Exercise Program: Right Upper extremity;With theraputty;With theraband;With written HEP provided;With Supervision Additional ADL Goal #1: Caregiverwill demonstrate ability to  mobilizept safely @ RW level for ADL with [facility]  Plan Discharge plan remains appropriate    Co-evaluation                 End of Session Equipment Utilized During Treatment: Gait belt;Rolling walker   Activity Tolerance Patient tolerated treatment well   Patient Left in bed;with call bell/phone within reach;with family/visitor present   Nurse Communication Mobility status;Other (comment) (need for DME)        Time: 1700-1732 OT Time Calculation (min): 32 min  Charges: OT General Charges $OT Visit: 1 Procedure OT Evaluation $Initial OT Evaluation Tier I: 1 Procedure OT Treatments $Self Care/Home Management : 23-37 mins  [place] 03/09/2014, 6:01 PM   [facility], OTR/L  [phone] 03/09/2014

## 2014-03-09 NOTE — Progress Notes (Signed)
VASCULAR LAB PRELIMINARY  PRELIMINARY  PRELIMINARY  PRELIMINARY  Carotid Dopplers completed.    Preliminary report:  1-39% ICA stenosis.  Vertebral artery flow is antegrade.  Iantha Fallen, RVT 03/09/2014, 4:22 PM

## 2014-03-11 NOTE — Progress Notes (Signed)
CARE MANAGEMENT NOTE 03/11/2014  Patient:  Jill Castaneda, Jill Castaneda   Account Number:  [id number]  Date Initiated:  03/11/2014  Documentation initiated by:  [facility]  Subjective/Objective Assessment:   weakness     Action/Plan:   grand-dtr   Anticipated DC Date:  03/09/2014   Anticipated DC Plan:  [place]  CM consult      Choice offered to / List presented to:     DME arranged  3-N-1  Vassie Moselle      DME agency  [place].        Status of service:  Completed, signed off Medicare Important Message given?   (If response is "NO", the following Medicare IM given date fields will be blank) Date Medicare IM given:   Date Additional Medicare IM given:    Discharge Disposition:  HOME/SELF CARE  Per UR Regulation:    If discussed at Long Length of Stay Meetings, dates discussed:    Comments:  03/11/2014 0930 NCM attempted contact to pt's number and grand-dtr Jill Castaneda # [phone]. Left message on voicemail for a return call. Called to follow up to see if pt received DME and wants HH. [facility] called and pt received DME. Jonnie Finner RN CCM Case Mgmt phone [phone]

## 2014-03-11 NOTE — Progress Notes (Signed)
03/11/2014 1115 NCM received a call from grand-dtr, states they cannot afford to pay for [facility] out of pocket. Reports her Mom is from [place] and she will be living with her. Encouraged granddtr to set up appt at the [facility]. They will be able to assist with meds and PCP follow up. Verbalized understanding. States she will call for appt. Jonnie Finner RN CCM Case Mgmt phone [phone]

## 2014-03-11 NOTE — Progress Notes (Signed)
Ur completed. Jonnie Finner RN CCM Case Mgmt phone [phone]

## 2014-03-15 ENCOUNTER — Encounter: Payer: Self-pay | Admitting: Family Medicine

## 2014-03-15 ENCOUNTER — Ambulatory Visit: Payer: Self-pay | Admitting: Family Medicine

## 2014-03-15 ENCOUNTER — Ambulatory Visit: Payer: Self-pay

## 2014-03-15 VITALS — BP 122/80 | HR 93 | Temp 97.6°F | Resp 16 | Ht <= 58 in | Wt 96.6 lb

## 2014-03-15 DIAGNOSIS — I6350 Cerebral infarction due to unspecified occlusion or stenosis of unspecified cerebral artery: Secondary | ICD-10-CM

## 2014-03-15 DIAGNOSIS — I639 Cerebral infarction, unspecified: Secondary | ICD-10-CM

## 2014-03-15 DIAGNOSIS — I10 Essential (primary) hypertension: Secondary | ICD-10-CM

## 2014-03-15 MED ORDER — AMLODIPINE BESYLATE 5 MG PO TABS: 5.0000 mg | ORAL_TABLET | Freq: Every day | ORAL | Status: DC

## 2014-03-15 MED ORDER — LISINOPRIL 10 MG PO TABS: 10.0000 mg | ORAL_TABLET | Freq: Every day | ORAL | Status: DC

## 2014-03-15 NOTE — Progress Notes (Signed)
Subjective:    Patient ID: Jill Castaneda, female    DOB: 1928/06/17, 80 y.o.   MRN: [id number]  HPI Chief Complaint  Patient presents with   Follow-up    Stroke 1 week ago, no PCP    This chart was scribed for Robyn Haber, MD by Thea Alken, ED Scribe. This patient was seen in room 10 and the patient's care was started at 3:28 PM.  HPI Comments: Jill Castaneda is a 80 y.o. female who presents to the [facility] here for a f/u regarding TIA that occurred 1 week ago . Pt daughter reports weakness to pt right side. She reports decrease in comprehension but pt is able to speak. Pt was seen by neurologist in hospital and that her next appointment is in 1 month. She reports pt is soon to see physical therapist. Daughter reports pt  A1C - 6.4. Pt has been prescribed lisinopril daily and hydralazine  3 times a day after TIA. Daughter expresses concern about pt taking hydralazine. She reports giving  pt smaller doses of the medication. Daughter reports pt BP went up to 170 last night. She reports giving pt lisinopril to lower BP to 130/90. She reports that pt had a severe HA this morning in which thought she would die. She reports double vision and ptosis to left eye. She states pt had increased heart rate of 110 at home PTA.  Pt only speaks russian. Pt lives with daughter.   Past Medical History  Diagnosis Date   Hypertension    Osteoporosis    Stroke    Cataract    No Known Allergies Prior to Admission medications   Medication Sig Start Date End Date Taking? Authorizing Provider  aspirin EC 81 MG tablet Take 1 tablet (81 mg total) by mouth daily. 03/09/14  Yes Thurnell Lose, MD  ASPIRIN PO Take 1 tablet by mouth daily.   Yes Historical Provider, MD  Calcium Carbonate-Vitamin D (CALCIUM + D PO) Take 1 tablet by mouth daily.   Yes Historical Provider, MD  GARLIC PO Take 1 capsule by mouth daily.   Yes Historical Provider, MD  hydrALAZINE (APRESOLINE) 25 MG tablet  Take 1 tablet (25 mg total) by mouth 3 (three) times daily. 03/09/14  Yes Thurnell Lose, MD  lisinopril (PRINIVIL,ZESTRIL) 2.5 MG tablet Take 1 tablet (2.5 mg total) by mouth daily. 03/09/14  Yes Thurnell Lose, MD  Multiple Vitamin (MULTIVITAMIN WITH MINERALS) TABS tablet Take 1 tablet by mouth every 3 (three) days.   Yes Historical Provider, MD    Review of Systems  Eyes: Positive for visual disturbance.  Neurological: Positive for headaches.  Psychiatric/Behavioral: Negative for confusion and dysphoric mood.      Objective:   Physical Exam  Nursing note and vitals reviewed. Constitutional: She is oriented to person, place, and time. She appears well-developed and well-nourished. No distress.  HENT:  Head: Normocephalic and atraumatic.  Eyes:  Disconjugate gage. Normal ocular movement in right eye.   Neck: Carotid bruit is not present. No tracheal deviation present.  Cardiovascular: Normal rate, regular rhythm and normal heart sounds.  Exam reveals no gallop and no friction rub.   No murmur heard. Pulmonary/Chest: Effort normal and breath sounds normal. No respiratory distress.  Musculoskeletal:  Right hemiparesis  Pt uses walker  Neurological: She is alert and oriented to person, place, and time.  Skin: Skin is warm and dry.  Psychiatric: She has a normal mood and affect. Her behavior  is normal.      Assessment & Plan:   1. CVA (cerebral infarction)   2. Hypertension    Meds ordered this encounter  Medications   amLODipine (NORVASC) 5 MG tablet    Sig: Take 1 tablet (5 mg total) by mouth daily.    Dispense:  90 tablet    Refill:  3   lisinopril (PRINIVIL,ZESTRIL) 10 MG tablet    Sig: Take 1 tablet (10 mg total) by mouth daily.    Dispense:  90 tablet    Refill:  3   Referral to neurology at [facility], MD

## 2014-03-15 NOTE — Patient Instructions (Addendum)
??????????? ???????  (Ischemic Stroke)  ??????? - (?????? ????????? ?????????????? ????????? ?????) - ????????? ????????? ????? ?????? ????????? ?????. ??? ?????????? ?????????. ??????? ????? ???????? ? ??????????? ?????? ??????? ????????? ?????. ??? ????? ????? ???????? ?????? ??????? ????????? ?????? ????. ???????????? ??????????? ????? (???) ?????????? ?? ????????, ?.?. ??? ???????? ? ??????????? ????????????. ??? - ??????????????? ?????????? ?? ???? ????????? ?????????????? ??????-?? ??????? ?????. ??? ????? ???????? ??????? ??????????, ?????? ??? ?????? ??? ??????????? ??????????? ???? ????????. ??? ?????? ????????? ?????????? ??????????, ??????? ?? ? ???????? ??????? ??? ???. ???????  ??????? ?????? ??????????? ??????????? ????????? ? ???? ?? ???????? ????????? ?????. ?????? ???????? ???????? ?????????? ?????????? ????????? ??????? ??? ?????????? ??????????? ??? ???? (??????) ? ??????????? ?????? ? ???????? ?????, ??-?? ???? ????? ????????? ????????. ??????? ????? ????? ???? ?????? ?????????? ??? ???????????? ?????????? ???????.  ??????? ?????   ?????????? ???????????? ???????? (???????????).  ??????? ??????? ??????????? ? ?????.  ???????? ??????.  ??????????? ????????-?????????? ???????.  ????????? ?????? ? ??????????? ??????? (???????????? ?????????????? ??????? ??? ????????????).  ????????? ?????? ? ??????????? ???????, ?????????? ?????? ? ?????????? ???????? ???? (?????? ?????? ???????).  ?????????? ????????? ???? (???????????? ???????).  ????????.  ???????.  ????? ??????????? ?????????????? (???????? ?? ???? ???????).  ?????????? ?????????? ??????????.  ???????????? ? ???? ?????????, ?????????? ????, ???? (??????), ???????????????? ?????.  ???????????? ????????.  ??????????????? ??????????? (???????? ???????? ? ??????????????).  ?????????????? ? ????: ????-??????????.  ??????? ?????? 55.  ????????????????.  ??????? ??????????? ????? - ??????????? ???????, ???????,  ??? ??? ???????.  ??????????-????????? ???????. ????????  ??? ???????? ?????? ??????????? ????????, ??? ????? ?????????? ????? ??????????? ?? ???.   ????????? ???????? ???????? ??? ???????? ????, ??? ??? ???, ???????? ? ????? ???????? ????.  ????????? ??????????? ??? ?????? ??? ??? ???????? ??????? ?????? ??? ??????.  ????????? ?????????? ????????.  ????????? ????????? ???????.  ???????????? ???? (??????) ??? ????????? ??? ?????????? ????.  ?????? ???????.  ??????????? ????????? ?????? ? ????? ??? ????? ??????.  ??????? ? ??????.  ??????????????.  ?????? ??????? ?????????? ??? ???????????.  ????????? ?????? ???????? ???? ??? ??????? ???????.  ????????? ??? ?????? ??? ??????. ???????  ??? ??????? ???? ????? ????? ??????????????? ??????? ?? ????????? ?????????, ???????? ???????? ? ???????????? ???????. ??? ????????????? ???????? ? ???????????? ?????? ? ??????? ??????????? ???????? ?????? ?????????? ???????????? ?????????? ????????? ?????. ??? ??????????? ?????? ???????? ????? ???? ????????? ?????? ????????????. ?????????????? ???????????? ????? ????????:   ???????????????????.  ?????????? ?????????? ?? ????????? ??????.  ???????????????.  ??? ?????? ???????.  ????????-??????????? ?????????? (???).  ???????????? ???????? ??????? ????????? ?????.  ?????? ?????. ????????????  ???? ???????? ????? ???? ????????, ???? ??????? ? ????????? ?????? ?????????? ???????????? ????????, ??????? ??????? ??????????? ? ?????, ???????? ? ????????, ?????? ??????? ??????, ???????? ? ????????, ?????????? ?? ??????? ? ???????? ????????, ?????????? ???????.  ???????  ????? ????? ??????? ????????. ?????? ????? ???????? ?????????? ??????? ? ?????? 3-4 ???? ????? ????????? ?????? ?????????, ?????? ??? ???????? ????? ???? ??????? ?????????, "???????????" ???????? ??????? (????????????), ??????? ????? ????????? ??? ??? ??????. ???? ???? ?? ?? ???????, ????? ????????? ?????? ????????, ??? ?????  ?????? ?????????? ?? ??????????? ???????. ????? ???????? "???? ???????????", ????????? 4 ????, ??????? ????? ???????? ? ???? ?????, ??????????? ?????, ???????? ???????? ???????????, ????? ??????????, ??????????? ????? (???????????????). ??????? ???????? ??????? ?? ?????????????????, ??????? ? ?????? ?????????. ????? ????????? ? ?????????? ????? ????? ?????? ?????????????? ??????, ?????????? ???????????? ???????? ? ???? ??????? ?????. ??????????????, ??????????? ?? ???????????? ? ?????????????? ???? ?????? ???? ????????? ????? ???????? ? ?????? ????????????????? ?????? ?? ?????????????? ???????, ?????????? ? ?????????? ????????. ??????????? ????????? ?????????? ???? ??? ??????????? ????????????? ? ???????????? ??????????, ??????? ???????????? ????????? ??-?? ????????? ????????? ????????? ? ?????? (????????????? ?????????), ??????????? ???????? ???????? ? ?????? ???????????, ?????????, ? ????? ??? ???????????? ???????. ? ?????? ??????? ????? ???? ???????? ????????????? ???????? ?? ???????? ??????? ???????? ???????? ??? ??????????????? ???????????? ???????.  ?????????? ?? ????? ? ???????? ????????   ?????????? ????????????? ????????? ? ???????????? ? ???????????? ?????. ????????? ???????? ???? ???????? ? ???????????. ??? ???????? ???????? ????? ???????? ????? ????? ???? ???????? ????? ????????????? ??????????. ?? ?????? ????????, ??? ?????? ???????? ??? ??????????? ?????????, ? ??? ?? ?????? ????????? ?????????.  ??? ????? ????????? ????? ???????? ??? ?????????. ????? ????????? ?????????? ???????????? ? ??????? ???????????? ? ????????.  ??????? ????????? ??? ??????? ??????? ???? ????????? ???????????? ?????????. ??????? ??????? ???????????? ????????? ? ????? ??????????? ???? ????????????. ??????? ????????? ???????????? ?? ???????? ? ???????? ????? ??????????? ???????. ?? ????? ?????? ????????? ?????????? ???????????? ??????? ??????? ?????. ??????? ????? ?????? ???????? ???? ?? ??????????? ?? ? ???. ??????????  ?? ? ??? ????????? ????? ??????? ?????????? ? ??????????? ???? ?????????. ???? ????? ?????????? ?? ?????? ????????. ???????? ????????? ???????? ????? ? ??? ?????, ??????? ???? ?????????. ?? ????? ????? ??????? ??????? ?? ??????????? ?? ? ??? ? ???????? ? ???????????.  ?????? ????????, ???????? ??????????? ????????? ?, ????? ?????? ?? ????????????? ????????? ? ?? ? ???. ??????? ????????? ? ??????? ??????????? ???????? ?: ??????, ??????? ?????????, ????????, ???????????? ???????, ???????? ??????? ? ????? ????, ?????? ???????????? ???????, ?????, ??????? ???????, ??????? ?????????, ????????, ? ????? ??????? ? ?????? ??????, ??????? ??? ? ?????? ?????. ??? ??????? ??????????? ? ???? ????????????? ?????????? ?????????, ??????????? ????????? ?. ????????? ???????????? ????????? ? ??????? ??????? ??? ???????? ????? ? ???, ??? ?? ???????? ?????? ???????. ?????????? ?? ????? ? ?????????.  ?????? ????????????? ????????? ????? ?????? ?? ????????????? ????????? ? ?? ?????????? ?? ? ???. ?? ?????? ???????? ???????? ????? ? ???? ??????????? ???? ??????????, ??????? ???????? ? ??????? ???????. ?????? ???????? ????????? ??? ?????? ???????? ? ????????????????????? ??????????. ?????? ???????? ????? ??? ?????????? ????? ????? ??????????? ????? ??????????? ??? ?????????????? ?????????? ?? ??????????? ??????? ????? ??? ???? ???????? ????? ?????? ??? ???????????  ? ????????? ???? ???????? ???????, ????????, ?????????? ?????????? ? ??????????? ????????????? ??? ????????????. ??? ?????????? ??????? ???????????? ??????? ?? ??????? ? ??????? ????? ??????????? ???????. ??? ??????? ???? ??? ????????? ????????? ??? ? ?????? ???????????? ??????? ???????? ????????.  ????????? ??????? ??????? ??? ????????????, ??????? ????? ????? ???????? ?????? ??? ??????? ????????????.  ?????? ????????? ?? ????? ??????, ??? ????????????? ?????? ???? ??? ????????? ? ??????? ??????????.  ???????? ????? ???????? ??????????? ????????? ????????? ????????.  ???????? ????????, ????? ???????? ???????? ???????? ??? ??????????? ?? ?????? ????? ?????????? ??????. ???????? ?????, ???? ?? ???????? ????? ???????????? ???????? ????????.  ????? ??????? ????? ??????????? ??????????? ???????? ????? ??? ??????????? ? ???, ??? ?? ?????????? ????????.  ???? ???????????? ????????????? ???????? ????????, ??? ???????????? ??????? ???????, ??? ????? ??????? ?? ?????? ???????, ????????? ?? ???????? ?????????. ?????????? ?????? ???????, ??????????  5 ? ????? ?????? ??????? ? ??????, ????? ????????? ???? ????????. ???? ? ????????? ??????? ?????? ???? ???????????? ???????????? (?????? ??? ? ???? ????), ??? ?????????? ???? ?????????? ?????????, ????? ?? ????????? ????????? ???? ? ??????????? ???? ??? ????????. ??? ??????? ??????? ? ?????????? ???????????? ?????????, ?????????? ??????? ???????????, ??? ???????? ??????? ? ??? ?????? ? ????????? ????? ???? ?????????? ????????? ????? ?????.  ??? ?????? ? ?????????? ???????????? ????????? ???????????? ???? ? ?????? ??????????? ??????, ?????????? ?????, ?????-????? ? ???????????.  ???? ? ?????? ??????????? ?????????? ?????, ?????-?????, ??????????? ? ?????????? ??????????? ????????? ????? ?????? ??????? ??????? ???????????.  ??? ???????? ??????? ???????????? ???? ? ?????? ???????????? ????????? ? ??????.  ??? ?????? ? ????????? ???????????? ?????? ??????? ? ??????????? ??????????? ???????, ? ?????? ??????????? ??????, ?????????? ?????, ?????-????? ? ???????????.  ????????????? ?????????? ???.  ??????????? ???????. ????????????? ?????????? ??????????? ???????????? ?? ????? 30 ????? ?????? ????.  ????????.  ?????????? ??????????? ????????, ???? ???? ?? ?? ?????????? ????????. ????????? ????????? ????? ?????????? ???????? ????????:  ?? ????? 2 ?????? ? ???? ??? ??????.  ?? ????? 1 ?????? ? ???? ??? ??????, ??????? ?? ?????????.  ?????????? ?? ??????????.  ??????????? ???????????? ????. ??? ?????????? ?????? ??????? ????  ?????????? ??????? ??? ?????????? ??????????. ??? ??????? ???? ????? ????????????? ?????????? ?????????????? ????????, ??????? ?????????????? ???? ??????????????? ? ????? ?????? ????????????. ????? ????? ????????????? ?????????? ?????? ? ??????? ? ? ?????? ???????. ? ????? ??????????? ??? ????? ???????????? ? ??????????? ???????????? ??? ?????????? ??? ????, ????????, ??????????? ??????? ???????? ? ???????? ??? ???????? ????.  ????????????, ????????????, ?????????. ?????????? ??????? ????? ????????????, ????? ????????? ???????? ??????????? ?????????????? ????? ????????. ???? ??? ????????????? ???????????? ????????? ??? ???????, ?????? ???????? ???? ?????????????. ??????????? ????????? ?? ??? ??????????? ?????? ? ??????.  ???????? ???? ????????? ????? ???????????? ????????? ???????. ??? ????? ?????. ???? ????????? ????? ??? ?????? ? ?????? ????????????, ????????????, ???????????????? ??????? ? ???????????? ????????????. ?????????? ? ?????? ????? ????????????? ????????? ???????. ?????????? ? ?????, ????:   ? ??? ???????? ????????? ???????.  ??? ?????? ???????.  ? ??? ??????? ? ??????.  ? ??? ??????????????.  ? ??? ?????????? ???????????.  ? ??? ???????? ????. ?????????? ?????????? ? ????? ??? ????????? ?????? ??????, ????:  ????? ?? ????????? ????????? ????? ????????? ?? ????????? ???????? ? ??????? ??????????? ???????????? ?????????????. ?? ?????, ??? ??? ?????? ?????. ?????????? ????????? ?????? ??????????? ??????. ????????? ? ????????? ????????? ?????? ??????(? ??? ???????: 911). ?? ????????? ?????????????? ????????? ?? ????????.   ? ??? ????????? ????????? ???????? ???????? ??? ???????? ????, ??? ??? ???, ???????? ? ????? ???????? ????.  ????????? ??????????? ??? ?????? ??? ??? ???????? ??????? ?????? ??? ??????.  ????????? ????????? ???????? ?????????? ????????.  ? ??? ???????????? ???? (??????) ??? ????????? ??? ?????????? ????.  ? ??? ?????????? ?????????? ?????? ? ????? ??? ?????  ??????.  ? ??? ??????????? ?????? ??????? ?????????? ??? ???????????.  ? ??? ???????? ????????? ?????? ???????? ???? ??? ??????? ???????.  ? ??? ????????????? ???? ? ????? ??? ???????? ???? ?????.  ? ??? ????????? ??? ?????? ?????? ????????.   Document Released: 10/18/2005 Document Revised: 06/20/2013 [facility] Patient Information 2014 [place], [place]. Ischemic Stroke A stroke (cerebrovascular accident) is the sudden death of brain tissue. It is a medical emergency. A stroke can cause permanent loss of brain function. This can cause problems with different parts of your body. A transient ischemic attack (TIA) is different because it does not cause permanent damage. A TIA is a short-lived problem of poor blood flow affecting a part of the brain. A TIA is also a serious problem because having a TIA greatly increases  the chances of having a stroke. When symptoms first develop, you cannot know if the problem might be a stroke or TIA. CAUSES  A stroke is caused by a decrease of oxygen supply to an area of your brain. It is usually the result of a small blood clot or collection of cholesterol or fat (plaque) that blocks blood flow in the brain. A stroke can also be caused by blocked or damaged carotid arteries.  RISK FACTORS  High blood pressure (hypertension).  High cholesterol.  Diabetes mellitus.  Heart disease.  The build up of plaque in the blood vessels (peripheral artery disease or atherosclerosis).  The build up of plaque in the blood vessels providing blood and oxygen to the brain (carotid artery stenosis).  An abnormal heart rhythm (atrial fibrillation).  Obesity.  Smoking.  Taking oral contraceptives (especially in combination with smoking).  Physical inactivity.  A diet high in fats, salt (sodium), and calories.  Alcohol use.  Use of illegal drugs (especially cocaine and methamphetamine).  Being African American.  Being over the age of 24.  Family history of  stroke.  Previous history of blood clots, stroke, TIA, or heart attack.  Sickle cell disease. SYMPTOMS  These symptoms usually develop suddenly, or may be newly present upon awakening from sleep:  Sudden weakness or numbness of the face, arm, or leg, especially on one side of the body.  Sudden trouble walking or difficulty moving arms or legs.  Sudden confusion.  Sudden personality changes.  Trouble speaking (aphasia) or understanding.  Difficulty swallowing.  Sudden trouble seeing in one or both eyes.  Double vision.  Dizziness.  Loss of balance or coordination.  Sudden severe headache with no known cause.  Trouble reading or writing. DIAGNOSIS  Your caregiver can often determine the presence or absence of a stroke based on your symptoms, history, and physical exam. Computed tomography (CT) of the brain is usually performed to confirm the stroke, determine causes, and determine stroke severity. Other tests may be done to find the cause of the stroke. These tests may include:  Electrocardiography.  Continuous heart monitoring.  Echocardiography.  Carotid ultrasonography.  Magnetic resonance imaging (MRI).  A scan of the brain circulation.  Blood tests. PREVENTION  The risk of a stroke can be decreased by appropriately treating high blood pressure, high cholesterol, diabetes, heart disease, and obesity and by quitting smoking, limiting alcohol, and staying physically active. TREATMENT  Time is of the essence. It is important to seek treatment within 3 4 hours of the start of symptoms because you may receive a medicine to dissolve the clot (thrombolytic) that cannot be given after that time. Even if you do not know when your symptoms began, get treatment as soon as possible. After the 4 hour window has passed, treatment may include rest, oxygen, intravenous (IV) fluids, and medicines to thin the blood (anticoagulants). Treatment of stroke depends on the duration,  severity, and cause of your symptoms. Medicines and diet may be used to address diabetes, high blood pressure, and other risk factors. Physical, speech, and occupational therapists will assess you and work to improve any functions impaired by the stroke. Measures will be taken to prevent short-term and long-term complications, including infection from breathing foreign material into the lungs (aspiration pneumonia), blood clots in the legs, bedsores, and falls. Rarely, surgery may be needed to remove large blood clots or to open up blocked arteries. HOME CARE INSTRUCTIONS   Take all medicines prescribed by your caregiver. Follow the directions  carefully. Medicines may be used to control risk factors for a stroke. Be sure you understand all your medicine instructions.  You may be told to take aspirin or the anticoagulant warfarin. Warfarin needs to be taken exactly as instructed.  Too much and too little warfarin are both dangerous. Too much warfarin increases the risk of bleeding. Too little warfarin continues to allow the risk for blood clots. While taking warfarin, you will need to have regular blood tests to measure your blood clotting time. These blood tests usually include both the PT and INR tests. The PT and INR results allow your caregiver to adjust your dose of warfarin. The dose can change for many reasons. It is critically important that you take warfarin exactly as prescribed, and that you have your PT and INR levels drawn exactly as directed.  Many foods, especially foods high in vitamin K can interfere with warfarin and affect the PT and INR results. Foods high in vitamin K include spinach, kale, broccoli, cabbage, collard and turnip greens, brussels sprouts, peas, cauliflower, seaweed, and parsley as well as beef and pork liver, green tea, and soybean oil. You should eat a consistent amount of foods high in vitamin K. Avoid major changes in your diet, or notify your caregiver before changing  your diet. Arrange a visit with a dietitian to answer your questions.  Many medicines can interfere with warfarin and affect the PT and INR results. You must tell your caregiver about any and all medicines you take, this includes all vitamins and supplements. Be especially cautious with aspirin and anti-inflammatory medicines. Do not take or discontinue any prescribed or over-the-counter medicine except on the advice of your caregiver or pharmacist.  Warfarin can have side effects, such as excessive bruising or bleeding. You will need to hold pressure over cuts for longer than usual. Your caregiver or pharmacist will discuss other potential side effects.  Avoid sports or activities that may cause injury or bleeding.  Be mindful when shaving, flossing your teeth, or handling sharp objects.  Alcohol can change the body's ability to handle warfarin. It is best to avoid alcoholic drinks or consume only very small amounts while taking warfarin. Notify your caregiver if you change your alcohol intake.  Notify your dentist or other caregivers before procedures.  If swallow studies have determined that your swallowing reflex is present, you should eat healthy foods. A diet that includes 5 or more servings of fruits and vegetables a day may reduce the risk of stroke. Foods may need to be a special consistency (soft or pureed), or small bites may need to be taken in order to avoid aspirating or choking. Certain diets may be prescribed to address high blood pressure, high cholesterol, diabetes, or obesity.  A low-sodium, low-saturated fat, low-trans fat, low-cholesterol diet is recommended to manage high blood pressure.  A low-saturated fat, low-trans fat, low-cholesterol, and high-fiber diet may control cholesterol levels.  A controlled-carbohydrate, controlled-sugar diet is recommended to manage diabetes.  A reduced-calorie, low-sodium, low-saturated fat, low-trans fat, low-cholesterol diet is  recommended to manage obesity.  Maintain a healthy weight.  Stay physically active. It is recommended that you get at least 30 minutes of activity on most or all days.  Do not smoke.  Limit alcohol use even if you are not taking warfarin. Moderate alcohol use is considered to be:  No more than 2 drinks each day for men.  No more than 1 drink each day for nonpregnant women.  Stop drug abuse.  Home safety. A safe home environment is important to reduce the risk of falls. Your caregiver may arrange for specialists to evaluate your home. Having grab bars in the bedroom and bathroom is often important. Your caregiver may arrange for equipment to be used at home, such as raised toilets and a seat for the shower.  Physical, occupational, and speech therapy. Ongoing therapy may be needed to maximize your recovery after a stroke. If you have been advised to use a walker or a cane, use it at all times. Be sure to keep your therapy appointments.  Follow all instructions for follow-up with your caregiver. This is very important. This includes any referrals, physical therapy, rehabilitation, and lab tests. Proper follow up can prevent another stroke from occurring. SEEK MEDICAL CARE IF:  You have personality changes.  You have difficulty swallowing.  You are seeing double.  You have dizziness.  You have a fever.  You have skin breakdown. SEEK IMMEDIATE MEDICAL CARE IF:  Any of these symptoms may represent a serious problem that is an emergency. Do not wait to see if the symptoms will go away. Get medical help right away. Call your local emergency services (911 in [place] Do not drive yourself to the hospital.  You have sudden weakness or numbness of the face, arm, or leg, especially on one side of the body.  You have sudden trouble walking or difficulty moving arms or legs.  You have sudden confusion.  You have trouble speaking (aphasia) or understanding.  You have sudden trouble  seeing in one or both eyes.  You have a loss of balance or coordination.  You have a sudden, severe headache with no known cause.  You have new chest pain or an irregular heartbeat.  You have a partial or total loss of consciousness.   Document Released: 10/18/2005 Document Revised: 06/20/2013 Document Reviewed: 05/28/2012 [facility] Patient Information 2014 [place].

## 2014-03-19 LAB — HEMOGLOBIN A1C
HEMOGLOBIN A1C: 6.4 % — AB (ref <5.7)
MEAN PLASMA GLUCOSE: 137 mg/dL — AB (ref <117)

## 2014-03-22 ENCOUNTER — Telehealth: Payer: Self-pay

## 2014-03-22 DIAGNOSIS — H269 Unspecified cataract: Secondary | ICD-10-CM

## 2014-03-22 NOTE — Telephone Encounter (Signed)
Pt granddaughter is requesting a referral to [facility] a dr tim Hassell Done  Best number [phone]

## 2014-03-26 ENCOUNTER — Telehealth: Payer: Self-pay | Admitting: *Deleted

## 2014-03-26 NOTE — Telephone Encounter (Signed)
1. Verify the lisinopril dose. The chart says 10 mg. 2. Hold (DO NOT TAKE) the amlodipine. 3. Check the BP daily. If the readings are consistently >130/90, call this office. 4. Follow-up here (or with PCP-I don't see that she has one), in 4 weeks.

## 2014-03-26 NOTE — Telephone Encounter (Signed)
Pt was seen about 10 days ago by Dr. Joseph Art and is now having low blood pressure readings.  Systolic running between 03-54 and dystolic has been running between 60-65.  They were told what to do about high bp but not low.  I guess the question is what is to low for her and what to do if it runs low?  She is having no symptoms.  Pt is taking lisinopril 5mg  and amlodipine 5mg .  Can call caretaker Vicente Males at [phone]

## 2014-03-27 NOTE — Telephone Encounter (Signed)
1. Pt is taking 5mg  lisinopril 2. Pt is taking 5mg  amlodipine- she will stop this starting tomorrow. 3. Vicente Males will monitor BP and bring her in.- transferred to billing to schedule.

## 2014-03-28 ENCOUNTER — Telehealth: Payer: Self-pay | Admitting: Neurology

## 2014-03-28 NOTE — Telephone Encounter (Signed)
Granddaughter stated Dr Leonie Man specifically told her to come to office in 1 mo for fu.  I explained patient needed to see Charlott Holler and gave first available appointment.  She would like a sooner appointment and a return call.  Thanks

## 2014-03-29 NOTE — Telephone Encounter (Signed)
Cancelled  previously appointment and gave the patient a NP appointment she never seen Dr. Leonie Man  or anyone from the practices in the hospital.

## 2014-03-29 NOTE — Telephone Encounter (Signed)
Friend of patient's granddaughter calling to state that they still would like a sooner appointment despite me telling him that patient already was given first available and was placed on waiting list. Friend of patient named Elta Guadeloupe states that they are very concerned since 3 neurologists told patient she needs to be seen in one month. Please call and advise.

## 2014-04-08 ENCOUNTER — Encounter (INDEPENDENT_AMBULATORY_CARE_PROVIDER_SITE_OTHER): Payer: Self-pay

## 2014-04-08 ENCOUNTER — Ambulatory Visit (INDEPENDENT_AMBULATORY_CARE_PROVIDER_SITE_OTHER): Payer: Self-pay | Admitting: Neurology

## 2014-04-08 ENCOUNTER — Encounter: Payer: Self-pay | Admitting: Neurology

## 2014-04-08 VITALS — BP 110/77 | HR 63 | Ht <= 58 in | Wt 98.4 lb

## 2014-04-08 DIAGNOSIS — I6350 Cerebral infarction due to unspecified occlusion or stenosis of unspecified cerebral artery: Secondary | ICD-10-CM

## 2014-04-08 NOTE — Progress Notes (Signed)
[facility] [address] [place] [address] [phone]       OFFICE CONSULT NOTE  [name] Date of Birth:  [birth year] Medical Record Number:  [id number]   Referring MD:  Jill Castaneda  Reason for Referral:  Stroke  HPI: 79 year elderly Caucasian [place] lady who was visiting family in [place] developed sudden onset of right-sided weakness with right facial droop and left eye drooping and vertical diplopia on 03/08/14. She was initially taken to [place] he had had a head CT scan showed subacute left parietal infarct which did not extend symptoms. She was not given TPA because of this but transferred to [facility]. MRI scan subsequently showed a paramedian left midbrain and thalamic acute non-hemorrhagic infarct. There were also bilateral cortical and subcortical infarcts of different ages involving left parietal lobe, right frontal lobe, right cerebellum. which apparently had been silent. Symptoms improved significantly but she had persistent left eye drooping and diplopia. She was started on aspirin. Lipid profile showed LDL of 88 mg percent. Evans was borderline at 6.4. Carotid ultrasound and transthoracic echo were both normal. MRA of the brain showed no significant large vessel stenosis. Patient was seen in physical occupational and speech therapy and discharge him with home therapy on aspirin and blood pressure medications. She seems to have done well since discharge and right sided weakness has improved back to baseline but she has persistent vertigo diplopia and of the left eyelid. The patient's granddaughter is accompanying her provides most of the history and states that the patient has had some short-term memory difficulties and has required more supervision in her activities of daily living. Her blood pressure has been quite well controlled in fact it is been on the low side. The patient had a lot of questions about when her vision would get  better and I spent a lot of time plantar explained to her. Patient speaks very little English and her granddaughters translated for her for this entire visit. The patient's visitors visa is to expire soon and patient has no health insurance in the [place] and the family hence is reluctant to pursue further workup for cardioembolic sources and she may have to return back to her home country. [place]ever she is not a good anticoagulation candidate given history of multiple falls and her reluctance to use her walker and now  her impaired vision and cognition  ROS:   14 system review of systems is positive for double vision , balance problem, dizziness only and all other systems negative  PMH:  Past Medical History  Diagnosis Date  . Hypertension   . Osteoporosis   . Stroke   . Cataract     Social History:  History   Social History  . Marital Status: Married    Spouse Name: N/A    Number of Children: 1  . Years of Education: masters   Occupational History  . retired    Social History Main Topics  . Smoking status: Never Smoker   . Smokeless tobacco: Not on file  . Alcohol Use: No  . Drug Use: No  . Sexual Activity: No   Other Topics Concern  . Not on file   Social History Narrative   Patient lives with granddaughter, patient drinks caffeine daily.    Medications:   Current Outpatient Prescriptions on File Prior to Visit  Medication Sig Dispense Refill  . aspirin EC 81 MG tablet Take 1 tablet (81 mg total) by mouth daily.  30 tablet  0  . ASPIRIN PO Take 1 tablet by mouth daily.      . Calcium Carbonate-Vitamin D (CALCIUM + D PO) Take 1 tablet by mouth daily.      Marland Kitchen GARLIC PO Take 1 capsule by mouth daily.      . Multiple Vitamin (MULTIVITAMIN WITH MINERALS) TABS tablet Take 1 tablet by mouth every 3 (three) days.       No current facility-administered medications on file prior to visit.    Allergies:  No Known Allergies  Physical Exam General: well developed,  well nourished, seated, in no evident distress Head: head normocephalic and atraumatic. Orohparynx benign Neck: supple with no carotid or supraclavicular bruits Cardiovascular: regular rate and rhythm, no murmurs Musculoskeletal: no deformity Skin:  no rash/petichiae Vascular:  Normal pulses all extremities Filed Vitals:   04/08/14 1452  BP: 110/77  Pulse: 63    Neurologic Exam Mental Status: Awake and fully alert. Oriented to place and person. Recent and remote memory poor. Attention span, concentration and fund of knowledge diminished. Mood and affect appropriate. MMSE not done Cranial Nerves: Fundoscopic exam not done.Vision acuity seems diminished bilaterally but doubt cooperation. Pupils equal, briskly reactive to light. Extraocular movements severely impaired with bilateral restriction of vertical gaze more in the left  then the right. Impaired horizontal movements in the left eye but preserved down gaze. Preserved horizontal movements in the right and down gaze but impaired upgaze. Right pupil 2 mm sluggish reactive left pupil 3 mm reactive. Corneal reflexes are present. Moderate ptosis of the left eye patient does not worsen with the stent up Patient has subjective vertical diplopia horns horizontal gaze right and left Visual fields full to confrontation. Hearing intact. Facial sensation intact. Face, tongue, palate moves normally and symmetrically.  Motor: Normal bulk and tone. Normal strength in all tested extremity muscles. Sensory.: intact to touch and pinprick and vibratory.  Coordination: Rapid alternating movements normal in all extremities. Finger-to-nose and heel-to-shin performed accurately bilaterally. Gait and Station: Arises from chair with mild difficulty. Stance is slightly stooped. Gait demonstrates normal stride length and balance . Notable to heel, toe and tandem walk without difficulty.  Reflexes: 1+ and symmetric. Toes downgoing.   NIHSS  1 Modified Rankin   3   ASSESSMENT: 74 year [place] lady who at left thalamic and midbrain infarct in May 2015 with remote history of multiple silent bilateral cortical and subcortical infarcts etiology probably cardioembolic but negative evaluation and patient not a good candidate for anticoagulation do to fall risk, vision impairment, memory loss and likely need to return back to her home country soon and live in a unsupervised environment.    PLAN: I had a long discussion with the patient and her granddaughter about her recent stroke, discussed secondary stroke prevention strategies and answered questions. She has clearly had prior multiple silent strokes with the present large brainstem/thalamic infarct raising clinical suspicion for atrial fibrillation. She however remains a fall risk and has memory and cognitive difficulties and will have to leave for [place] in a few months and hence is not a candidate for anticoagulation hence I do not recommend further evaluation for that. Continue aspirin for stroke prevention with strict control of hypertension with blood pressure goal below 130/90. Wear eye patch for diplopia and if not improved in the next one to 2 months consider a trial of prisms. Return for followup in the future only as necessary.  Note: This document was prepared with digital dictation and possible  smart Company secretary. Any transcriptional errors that result from this process are unintentional.

## 2014-04-08 NOTE — Patient Instructions (Signed)
I had a long discussion with the patient and her granddaughter about her recent stroke, discussed secondary stroke prevention strategies and answered questions. She has clearly had prior multiple silent strokes with the present large brainstem/thalamic infarct raising clinical suspicion for atrial fibrillation. She however remains a fall risk and has memory and cognitive difficulties and will have to leave for [place] in a few months and hence is not a candidate for anticoagulation hence I do not recommend further evaluation for that. Continue aspirin for stroke prevention with strict control of hypertension with blood pressure goal below 130/90. Wear eye patch for diplopia and if not improved in the next one to 2 months consider a trial of prisms. Return for followup in the future only as necessary.  Stroke Prevention Some medical conditions and behaviors are associated with an increased chance of having a stroke. You may prevent a stroke by making healthy choices and managing medical conditions. HOW CAN I REDUCE MY RISK OF HAVING A STROKE?   Stay physically active. Get at least 30 minutes of activity on most or all days.  Do not smoke. It may also be helpful to avoid exposure to secondhand smoke.  Limit alcohol use. Moderate alcohol use is considered to be:  No more than 2 drinks per day for men.  No more than 1 drink per day for nonpregnant women.  Eat healthy foods. This involves  Eating 5 or more servings of fruits and vegetables a day.  Following a diet that addresses high blood pressure (hypertension), high cholesterol, diabetes, or obesity.  Manage your cholesterol levels.  A diet low in saturated fat, trans fat, and cholesterol and high in fiber may control cholesterol levels.  Take any prescribed medicines to control cholesterol as directed by your health care provider.  Manage your diabetes.  A controlled-carbohydrate, controlled-sugar diet is recommended to manage  diabetes.  Take any prescribed medicines to control diabetes as directed by your health care provider.  Control your hypertension.  A low-salt (sodium), low-saturated fat, low-trans fat, and low-cholesterol diet is recommended to manage hypertension.  Take any prescribed medicines to control hypertension as directed by your health care provider.  Maintain a healthy weight.  A reduced-calorie, low-sodium, low-saturated fat, low-trans fat, low-cholesterol diet is recommended to manage weight.  Stop drug abuse.  Avoid taking birth control pills.  Talk to your health care provider about the risks of taking birth control pills if you are over 74 years old, smoke, get migraines, or have ever had a blood clot.  Get evaluated for sleep disorders (sleep apnea).  Talk to your health care provider about getting a sleep evaluation if you snore a lot or have excessive sleepiness.  Take medicines as directed by your health care provider.  For some people, aspirin or blood thinners (anticoagulants) are helpful in reducing the risk of forming abnormal blood clots that can lead to stroke. If you have the irregular heart rhythm of atrial fibrillation, you should be on a blood thinner unless there is a good reason you cannot take them.  Understand all your medicine instructions.  Make sure that other other conditions (such as anemia or atherosclerosis) are addressed. SEEK IMMEDIATE MEDICAL CARE IF:   You have sudden weakness or numbness of the face, arm, or leg, especially on one side of the body.  Your face or eyelid droops to one side.  You have sudden confusion.  You have trouble speaking (aphasia) or understanding.  You have sudden trouble seeing in one  or both eyes.  You have sudden trouble walking.  You have dizziness.  You have a loss of balance or coordination.  You have a sudden, severe headache with no known cause.  You have new chest pain or an irregular heartbeat. Any of  these symptoms may represent a serious problem that is an emergency. Do not wait to see if the symptoms will go away. Get medical help at once. Call your local emergency services  (911 in [place] Do not drive yourself to the hospital. Document Released: 11/25/2004 Document Revised: 08/08/2013 Document Reviewed: 04/20/2013 [facility] Patient Information 2014 [place].

## 2014-04-12 ENCOUNTER — Encounter (HOSPITAL_COMMUNITY): Payer: Self-pay | Admitting: Emergency Medicine

## 2014-04-12 ENCOUNTER — Inpatient Hospital Stay (HOSPITAL_COMMUNITY)
Admission: EM | Admit: 2014-04-12 | Discharge: 2014-04-16 | DRG: 282 | Disposition: A | Discharge status: Home / Self Care | Payer: Self-pay | Attending: Internal Medicine | Admitting: Internal Medicine

## 2014-04-12 ENCOUNTER — Emergency Department (HOSPITAL_COMMUNITY): Payer: Self-pay

## 2014-04-12 DIAGNOSIS — Z7982 Long term (current) use of aspirin: Secondary | ICD-10-CM

## 2014-04-12 DIAGNOSIS — M810 Age-related osteoporosis without current pathological fracture: Secondary | ICD-10-CM | POA: Diagnosis present

## 2014-04-12 DIAGNOSIS — I498 Other specified cardiac arrhythmias: Secondary | ICD-10-CM | POA: Diagnosis present

## 2014-04-12 DIAGNOSIS — Z79899 Other long term (current) drug therapy: Secondary | ICD-10-CM

## 2014-04-12 DIAGNOSIS — I639 Cerebral infarction, unspecified: Secondary | ICD-10-CM | POA: Diagnosis present

## 2014-04-12 DIAGNOSIS — I214 Non-ST elevation (NSTEMI) myocardial infarction: Principal | ICD-10-CM | POA: Diagnosis present

## 2014-04-12 DIAGNOSIS — I10 Essential (primary) hypertension: Secondary | ICD-10-CM | POA: Diagnosis present

## 2014-04-12 DIAGNOSIS — Z96649 Presence of unspecified artificial hip joint: Secondary | ICD-10-CM

## 2014-04-12 DIAGNOSIS — I69998 Other sequelae following unspecified cerebrovascular disease: Secondary | ICD-10-CM

## 2014-04-12 DIAGNOSIS — H539 Unspecified visual disturbance: Secondary | ICD-10-CM

## 2014-04-12 DIAGNOSIS — I69969 Other paralytic syndrome following unspecified cerebrovascular disease affecting unspecified side: Secondary | ICD-10-CM

## 2014-04-12 DIAGNOSIS — E785 Hyperlipidemia, unspecified: Secondary | ICD-10-CM | POA: Diagnosis present

## 2014-04-12 DIAGNOSIS — Z8249 Family history of ischemic heart disease and other diseases of the circulatory system: Secondary | ICD-10-CM

## 2014-04-12 DIAGNOSIS — R079 Chest pain, unspecified: Secondary | ICD-10-CM

## 2014-04-12 LAB — CBC
HEMATOCRIT: 40.6 % (ref 36.0–46.0)
HEMOGLOBIN: 13.2 g/dL (ref 12.0–15.0)
MCH: 29.2 pg (ref 26.0–34.0)
MCHC: 32.5 g/dL (ref 30.0–36.0)
MCV: 89.8 fL (ref 78.0–100.0)
Platelets: 421 10*3/uL — ABNORMAL HIGH (ref 150–400)
RBC: 4.52 MIL/uL (ref 3.87–5.11)
RDW: 14.9 % (ref 11.5–15.5)
WBC: 8.4 10*3/uL (ref 4.0–10.5)

## 2014-04-12 LAB — BASIC METABOLIC PANEL
BUN: 16 mg/dL (ref 6–23)
CHLORIDE: 103 meq/L (ref 96–112)
CO2: 25 mEq/L (ref 19–32)
Calcium: 9.2 mg/dL (ref 8.4–10.5)
Creatinine, Ser: 1.1 mg/dL (ref 0.50–1.10)
GFR calc Af Amer: 52 mL/min — ABNORMAL LOW (ref >90)
GFR, EST NON AFRICAN AMERICAN: 45 mL/min — AB (ref >90)
Glucose, Bld: 106 mg/dL — ABNORMAL HIGH (ref 70–99)
POTASSIUM: 4.5 meq/L (ref 3.7–5.3)
SODIUM: 141 meq/L (ref 137–147)

## 2014-04-12 LAB — I-STAT TROPONIN, ED: Troponin i, poc: 0.02 ng/mL (ref 0.00–0.08)

## 2014-04-12 MED ORDER — NITROGLYCERIN 0.4 MG SL SUBL
0.4000 mg | SUBLINGUAL_TABLET | SUBLINGUAL | Status: DC | PRN
  Administered 2014-04-12: 0.4 mg via SUBLINGUAL
  Filled 2014-04-12: qty 1

## 2014-04-12 MED ORDER — IOHEXOL 350 MG/ML SOLN
100.0000 mL | Freq: Once | INTRAVENOUS | Status: AC | PRN
  Administered 2014-04-12: 80 mL via INTRAVENOUS

## 2014-04-12 MED ORDER — MORPHINE SULFATE 4 MG/ML IJ SOLN
4.0000 mg | Freq: Once | INTRAMUSCULAR | Status: DC
  Filled 2014-04-12: qty 1

## 2014-04-12 NOTE — ED Notes (Signed)
Pt refuses any pain medication or pain management

## 2014-04-12 NOTE — ED Notes (Signed)
Pt's family member st's left chest pain started today while they were walking.  St's they normally walk for 1 hour or 1 1/2 hours a day.  St's today was hot but they have walked when it was hotter without any problems

## 2014-04-12 NOTE — ED Notes (Signed)
Patient returned from XR. 

## 2014-04-12 NOTE — ED Notes (Signed)
Patient with reported not feeling well this morning. She had told her family that she could feel her heart beating.  Hr reported to be 48,  Patient went for a walk  And developed chest pain while walking.  Episode started 1 hour ago.  Patient has been resting with no relief in her pain.  Patient is 7/10.  Patient takes aspirin daily.  She has hx of recent cva Mar 08, 2014.  Patient with no hx of mi.  Family is here translating for patient.

## 2014-04-12 NOTE — ED Notes (Signed)
PA at bedside.

## 2014-04-12 NOTE — ED Notes (Signed)
Patient transported to XR. 

## 2014-04-12 NOTE — ED Provider Notes (Signed)
CSN: [id number]     Arrival date & time 04/12/14  1747 History   First MD Initiated Contact with Patient 04/12/14 [place]     Chief Complaint  Patient presents with  . Chest Pain     (Consider location/radiation/quality/duration/timing/severity/associated sxs/prior Treatment) HPI  80 year old female with prior stroke brought here by family for evaluation of chest discomfort. Hx obtain by granddaughter who is at bedside. Patient had a stroke a month ago with residual right hemiparesis and diplopia. She is currently receiving physical therapy and had been walking with the family members daily for at least one to 1-2 hours a day. Today while walking roughly about 3 hours ago patient developed a gradual onset of left-sided chest pain which radiates to the back. Pain is described as "somebody kicking me in the chest" and has been persistent since. She has never had pain like this before. Resting did not relieve the pain. She rates pain as 7/10. She took the aspirin today. She has no prior history of cardiac disease or history of MI. She denies fever, chills, lightheadedness, dizziness, productive cough, shortness of breath, dyspnea on exertion, abdominal pain, nausea vomiting diarrhea, or rash.  Past Medical History  Diagnosis Date  . Hypertension   . Osteoporosis   . Stroke   . Cataract    Past Surgical History  Procedure Laterality Date  . Total hip arthroplasty Right   . Shoulder hemi-arthroplasty Left   . Shoulder hardware removal Left   . Fracture surgery     Family History  Problem Relation Age of Onset  . Hypertension Father   . Heart disease Father   . Hypertension Sister   . Heart disease Sister   . Stroke Mother   . Cancer Brother    History  Substance Use Topics  . Smoking status: Never Smoker   . Smokeless tobacco: Not on file  . Alcohol Use: No   OB History   Grav Para Term Preterm Abortions TAB SAB Ect Mult Living                 Review of Systems  All other  systems reviewed and are negative.     Allergies  Review of patient's allergies indicates no known allergies.  Home Medications   Prior to Admission medications   Medication Sig Start Date End Date Taking? Authorizing Provider  amLODipine (NORVASC) 5 MG tablet Take 1.25 mg by mouth daily. Takes a quarter tablet 03/15/14  Yes Robyn Haber, MD  aspirin EC 81 MG tablet Take 1 tablet (81 mg total) by mouth daily. 03/09/14  Yes Thurnell Lose, MD  Calcium Carbonate-Vitamin D (CALCIUM + D PO) Take 1 tablet by mouth daily.   Yes Historical Provider, MD  lisinopril (PRINIVIL,ZESTRIL) 5 MG tablet Take 5 mg by mouth daily.   Yes Historical Provider, MD  Multiple Vitamin (MULTIVITAMIN WITH MINERALS) TABS tablet Take 1 tablet by mouth every 3 (three) days.   Yes Historical Provider, MD   BP 181/86  Pulse 63  Temp(Src) 99.1 F (37.3 C) (Oral)  Resp 19  SpO2 96% Physical Exam  Nursing note and vitals reviewed. Constitutional: She appears well-developed and well-nourished. No distress.  HENT:  Head: Atraumatic.  Mouth/Throat: Oropharynx is clear and moist.  Eyes: Conjunctivae are normal.  Neck: Neck supple.  Cardiovascular: Normal rate and regular rhythm.   Pulmonary/Chest: Effort normal and breath sounds normal. No respiratory distress. She has no wheezes. She has no rales. She exhibits tenderness (mild tenderness to  L upper chest on palpation without overlying skin changes, crepitus, or emphysema.  ).  Abdominal: There is no tenderness.  Musculoskeletal: She exhibits no edema.  Neurological: She is alert.  Skin: No rash noted.  Psychiatric: She has a normal mood and affect.    ED Course  Procedures (including critical care time)  7:08 PM Pt developed left-sided chest pain while walking. Pain is persistent. Pain medication given.  Cardiac work up initiated.  Low suspicion for PE given no hypoxia, no complaints of SOB or hemoptysis.      9:27 PM Pt report having shortness of breath  while lying in bed.  Her O2 went down to 87% on RA. This is after receiving SL nitro.  Supplemental O2 given, will obtain chest CTA to r/o PE.  Plan to have pt admittted for further care.  Care discussed with Dr. Mingo Amber.    12:18 AM Chest CT angiogram shows no evidence of PE. There are bibasilar atelectasis noted but otherwise no focal infiltrate to suggest infection. Pt currently felt better and currently pain free.  Pt and family member made aware of plan.    12:46 AM I have consulted with [facility], Dr. Arnoldo Morale who agrees to admit pt to telemetry, team 10, under her care for chest pain rule out.    Labs Review Labs Reviewed  CBC - Abnormal; Notable for the following:    Platelets 421 (*)    All other components within normal limits  BASIC METABOLIC PANEL - Abnormal; Notable for the following:    Glucose, Bld 106 (*)    GFR calc non Af Amer 45 (*)    GFR calc Af Amer 52 (*)    All other components within normal limits  Randolm Idol, ED    Imaging Review Dg Chest 2 View  04/12/2014   CLINICAL DATA:  Chest pain, weakness  EXAM: CHEST  2 VIEW  COMPARISON:  03/08/2014  FINDINGS: Chronic interstitial markings/emphysematous changes. No focal consolidation. No pleural effusion or pneumothorax.  Heart is normal in size.  Degenerative changes of the visualized thoracolumbar spine. Exaggerated thoracic kyphosis.  IMPRESSION: No evidence of acute cardiopulmonary disease.   Electronically Signed   By: Julian Hy M.D.   On: 04/12/2014 20:49   Ct Angio Chest Pe W/cm &/or Wo Cm  04/13/2014   CLINICAL DATA:  Shortness of breath and chest pain.  EXAM: CT ANGIOGRAPHY CHEST WITH CONTRAST  TECHNIQUE: Multidetector CT imaging of the chest was performed using the standard protocol during bolus administration of intravenous contrast. Multiplanar CT image reconstructions and MIPs were obtained to evaluate the vascular anatomy.  CONTRAST:  18mL OMNIPAQUE IOHEXOL 350 MG/ML SOLN  COMPARISON:   Chest radiograph performed earlier today at 8:34 p.m.  FINDINGS: There is no evidence of pulmonary embolus.  Bibasilar atelectasis is noted. The lungs are otherwise clear. There is no evidence of significant focal consolidation, pleural effusion or pneumothorax. No masses are identified; no abnormal focal contrast enhancement is seen.  The mediastinum is unremarkable in appearance. No mediastinal lymphadenopathy is seen. No pericardial effusion is identified. The great vessels are grossly unremarkable in appearance. No axillary lymphadenopathy is seen. The thyroid gland is unremarkable in appearance.  The visualized portions of the liver and spleen are unremarkable.  No acute osseous abnormalities are seen. There is chronic loss of height at vertebral body T8.  Review of the MIP images confirms the above findings.  IMPRESSION: 1. No evidence of pulmonary embolus. 2. Bibasilar atelectasis noted; lungs  otherwise clear.   Electronically Signed   By: Garald Balding M.D.   On: 04/13/2014 00:03     EKG Interpretation None      Date: 04/12/2014  Rate: 64  Rhythm: normal sinus rhythm  QRS Axis: left  Intervals: normal  ST/T Wave abnormalities: normal  Conduction Disutrbances:none  Narrative Interpretation:   Old EKG Reviewed: none available    MDM   Final diagnoses:  Chest pain    BP 143/76  Pulse 50  Temp(Src) 99.4 F (37.4 C) (Oral)  Resp 15  SpO2 96%  I have reviewed nursing notes and vital signs. I personally reviewed the imaging tests through PACS system  I reviewed available ER/hospitalization records thought the EMR     Domenic Moras, [place] 04/13/14 0046

## 2014-04-12 NOTE — ED Notes (Addendum)
Patient transported to CT; pt family accompanied pt for translation

## 2014-04-12 NOTE — ED Notes (Signed)
Patient returned from CT

## 2014-04-13 DIAGNOSIS — I519 Heart disease, unspecified: Secondary | ICD-10-CM

## 2014-04-13 DIAGNOSIS — R079 Chest pain, unspecified: Secondary | ICD-10-CM | POA: Diagnosis present

## 2014-04-13 DIAGNOSIS — I214 Non-ST elevation (NSTEMI) myocardial infarction: Secondary | ICD-10-CM

## 2014-04-13 DIAGNOSIS — I10 Essential (primary) hypertension: Secondary | ICD-10-CM

## 2014-04-13 DIAGNOSIS — I6350 Cerebral infarction due to unspecified occlusion or stenosis of unspecified cerebral artery: Secondary | ICD-10-CM

## 2014-04-13 LAB — COMPREHENSIVE METABOLIC PANEL
ALK PHOS: 52 U/L (ref 39–117)
ALT: 11 U/L (ref 0–35)
AST: 36 U/L (ref 0–37)
Albumin: 3.2 g/dL — ABNORMAL LOW (ref 3.5–5.2)
BILIRUBIN TOTAL: 1.1 mg/dL (ref 0.3–1.2)
BUN: 15 mg/dL (ref 6–23)
CHLORIDE: 102 meq/L (ref 96–112)
CO2: 27 mEq/L (ref 19–32)
Calcium: 9.4 mg/dL (ref 8.4–10.5)
Creatinine, Ser: 1.15 mg/dL — ABNORMAL HIGH (ref 0.50–1.10)
GFR calc Af Amer: 49 mL/min — ABNORMAL LOW (ref >90)
GFR calc non Af Amer: 42 mL/min — ABNORMAL LOW (ref >90)
Glucose, Bld: 135 mg/dL — ABNORMAL HIGH (ref 70–99)
POTASSIUM: 4.1 meq/L (ref 3.7–5.3)
Sodium: 142 mEq/L (ref 137–147)
Total Protein: 6.7 g/dL (ref 6.0–8.3)

## 2014-04-13 LAB — BASIC METABOLIC PANEL
BUN: 13 mg/dL (ref 6–23)
CHLORIDE: 103 meq/L (ref 96–112)
CO2: 26 meq/L (ref 19–32)
Calcium: 8.8 mg/dL (ref 8.4–10.5)
Creatinine, Ser: 1.02 mg/dL (ref 0.50–1.10)
GFR calc non Af Amer: 49 mL/min — ABNORMAL LOW (ref >90)
GFR, EST AFRICAN AMERICAN: 57 mL/min — AB (ref >90)
Glucose, Bld: 89 mg/dL (ref 70–99)
POTASSIUM: 4.1 meq/L (ref 3.7–5.3)
Sodium: 141 mEq/L (ref 137–147)

## 2014-04-13 LAB — CBC
HEMATOCRIT: 37.1 % (ref 36.0–46.0)
HEMOGLOBIN: 12.1 g/dL (ref 12.0–15.0)
MCH: 29.2 pg (ref 26.0–34.0)
MCHC: 32.6 g/dL (ref 30.0–36.0)
MCV: 89.6 fL (ref 78.0–100.0)
Platelets: 401 10*3/uL — ABNORMAL HIGH (ref 150–400)
RBC: 4.14 MIL/uL (ref 3.87–5.11)
RDW: 14.7 % (ref 11.5–15.5)
WBC: 6.6 10*3/uL (ref 4.0–10.5)

## 2014-04-13 LAB — TROPONIN I
Troponin I: 1.65 ng/mL (ref <0.30)
Troponin I: 2.28 ng/mL (ref <0.30)

## 2014-04-13 MED ORDER — HYDROMORPHONE HCL PF 1 MG/ML IJ SOLN: 0.5000 mg | INTRAMUSCULAR | Status: DC | PRN

## 2014-04-13 MED ORDER — CLOPIDOGREL BISULFATE 75 MG PO TABS
75.0000 mg | ORAL_TABLET | Freq: Every day | ORAL | Status: DC
  Administered 2014-04-14 – 2014-04-16 (×3): 75 mg via ORAL
  Filled 2014-04-13 (×4): qty 1

## 2014-04-13 MED ORDER — ASPIRIN EC 81 MG PO TBEC
81.0000 mg | DELAYED_RELEASE_TABLET | Freq: Every day | ORAL | Status: DC
  Administered 2014-04-13 – 2014-04-16 (×4): 81 mg via ORAL
  Filled 2014-04-13 (×4): qty 1

## 2014-04-13 MED ORDER — SODIUM CHLORIDE 0.9 % IV SOLN: 250.0000 mL | INTRAVENOUS | Status: DC | PRN

## 2014-04-13 MED ORDER — ENOXAPARIN SODIUM 30 MG/0.3ML ~~LOC~~ SOLN
30.0000 mg | Freq: Every day | SUBCUTANEOUS | Status: DC
  Administered 2014-04-13 – 2014-04-15 (×3): 30 mg via SUBCUTANEOUS
  Filled 2014-04-13 (×4): qty 0.3

## 2014-04-13 MED ORDER — CALCIUM CARBONATE-VITAMIN D 500-200 MG-UNIT PO TABS
1.0000 | ORAL_TABLET | Freq: Every day | ORAL | Status: DC
  Administered 2014-04-13 – 2014-04-16 (×4): 1 via ORAL
  Filled 2014-04-13 (×5): qty 1

## 2014-04-13 MED ORDER — LISINOPRIL 5 MG PO TABS
5.0000 mg | ORAL_TABLET | Freq: Every day | ORAL | Status: DC
  Administered 2014-04-13 – 2014-04-15 (×3): 5 mg via ORAL
  Filled 2014-04-13 (×4): qty 1

## 2014-04-13 MED ORDER — ONDANSETRON HCL 4 MG PO TABS: 4.0000 mg | ORAL_TABLET | Freq: Four times a day (QID) | ORAL | Status: DC | PRN

## 2014-04-13 MED ORDER — ONDANSETRON HCL 4 MG/2ML IJ SOLN: 4.0000 mg | Freq: Four times a day (QID) | INTRAMUSCULAR | Status: DC | PRN

## 2014-04-13 MED ORDER — ACETAMINOPHEN 325 MG PO TABS: 650.0000 mg | ORAL_TABLET | Freq: Four times a day (QID) | ORAL | Status: DC | PRN

## 2014-04-13 MED ORDER — ADULT MULTIVITAMIN W/MINERALS CH
1.0000 | ORAL_TABLET | ORAL | Status: DC
  Administered 2014-04-13 – 2014-04-16 (×2): 1 via ORAL
  Filled 2014-04-13 (×2): qty 1

## 2014-04-13 MED ORDER — AMLODIPINE BESYLATE 2.5 MG PO TABS
1.2500 mg | ORAL_TABLET | Freq: Every day | ORAL | Status: DC
  Administered 2014-04-13: 10:00:00 via ORAL
  Administered 2014-04-14: 1.25 mg via ORAL
  Filled 2014-04-13 (×2): qty 0.5

## 2014-04-13 MED ORDER — SODIUM CHLORIDE 0.9 % IJ SOLN
3.0000 mL | Freq: Two times a day (BID) | INTRAMUSCULAR | Status: DC
  Administered 2014-04-13 – 2014-04-15 (×3): 3 mL via INTRAVENOUS

## 2014-04-13 MED ORDER — OXYCODONE HCL 5 MG PO TABS: 5.0000 mg | ORAL_TABLET | ORAL | Status: DC | PRN

## 2014-04-13 MED ORDER — ATORVASTATIN CALCIUM 40 MG PO TABS
40.0000 mg | ORAL_TABLET | Freq: Every day | ORAL | Status: DC
  Administered 2014-04-14 – 2014-04-15 (×2): 40 mg via ORAL
  Filled 2014-04-13 (×4): qty 1

## 2014-04-13 MED ORDER — SODIUM CHLORIDE 0.9 % IJ SOLN: 3.0000 mL | INTRAMUSCULAR | Status: DC | PRN

## 2014-04-13 MED ORDER — ACETAMINOPHEN 650 MG RE SUPP: 650.0000 mg | Freq: Four times a day (QID) | RECTAL | Status: DC | PRN

## 2014-04-13 MED ORDER — ALUM & MAG HYDROXIDE-SIMETH 200-200-20 MG/5ML PO SUSP: 30.0000 mL | Freq: Four times a day (QID) | ORAL | Status: DC | PRN

## 2014-04-13 MED ORDER — METOPROLOL TARTRATE 12.5 MG HALF TABLET
12.5000 mg | ORAL_TABLET | Freq: Two times a day (BID) | ORAL | Status: DC
  Administered 2014-04-14 – 2014-04-15 (×2): 12.5 mg via ORAL
  Filled 2014-04-13 (×8): qty 1

## 2014-04-13 MED ORDER — SODIUM CHLORIDE 0.9 % IJ SOLN
3.0000 mL | Freq: Two times a day (BID) | INTRAMUSCULAR | Status: DC
  Administered 2014-04-13 (×2): via INTRAVENOUS
  Administered 2014-04-13 – 2014-04-14 (×4): 3 mL via INTRAVENOUS

## 2014-04-13 NOTE — Progress Notes (Signed)
[facility] PROGRESS NOTE  Jill Castaneda EHU:[id number] DOB: 15-May-1928 DOA: 04/12/2014 PCP: No primary provider on file. I have seen and examined pt who is a 80yo admitted this am by Dr Arnoldo Morale with HTN, and CVA who presented with complaints of CP. Pt denies CP this am, no SOB. Will continue current management and plan as per Dr. Beverely Low troponin negative, but follow up troponin elevated 1.64. I have ordered a 2-D echo and consulted cardiology-Dr. Wynonia Lawman to see, will follow      [facility] Pager [phone]. If 7PM-7AM, please contact night-coverage at [email or web address], password [facility] 04/13/2014, 10:44 AM  LOS: 1 day

## 2014-04-13 NOTE — Consult Note (Signed)
Cardiology Consult Note  Admit date: 04/12/2014 Name: Jill Castaneda 80 y.o.  female DOB:  1928-10-27 MRN:  [id number]  Today's date:  04/13/2014  Referring Physician:    [facility]  Reason for Consultation:    Prolonged chest pain and abnormal troponin  IMPRESSIONS: 1. Non-ST elevation myocardial infarction 2. Hypertension 3. Recent stroke 4. Hyperlipidemia  RECOMMENDATION: 1. Add Plavix to regimen 2. Obtain repeat echo to determine if there's been any interval myocardial damage 3. Continue to cycle troponins 4. In light of age and a recent stroke as well as transient residence in this country treated intensively medically. If she has recurrent symptoms we made to alter this approach.  HISTORY: This 80 year-old female is visiting family from [place]. She normally lives alone in an apartment there and has hypertension. She had a stroke in May with visual problems in the right eye and right sided weakness. She had a area of low attenuation in the left parietal lobe suspicious for subacute infarction and was transferred here. She was felt to have a left paramedian midbrain and left thalamic nonhemorrhagic infarction. She also had remote infarcts involving the left parietal lobe anterior frontal lobe and right cerebellum.  She eventually made a reasonable recovery from this and was walking with her family yesterday she had the onset of left-sided chest pain radiating to her left arm assistance dyspnea lasted 3 to half hours. She was found to be bradycardic in the protein was discussed as pressure and heaviness and was not lethal she was a minister 3 nitroglycerin and morphine. The pain was exercise related and initial troponins were negative. A subsequent troponin is returned elevated at 1.65. She currently has no chest pain. The history is obtained through the grand daughter. Her to this she lived alone in her apartment. She denied PND, orthopnea or claudication.  Past Medical  History  Diagnosis Date  . Hypertension   . Osteoporosis   . Stroke   . Cataract       Past Surgical History  Procedure Laterality Date  . Total hip arthroplasty Right   . Shoulder hemi-arthroplasty Left   . Shoulder hardware removal Left   . Fracture surgery       Allergies:  has No Known Allergies.   Medications: Prior to Admission medications   Medication Sig Start Date End Date Taking? Authorizing Provider  amLODipine (NORVASC) 5 MG tablet Take 1.25 mg by mouth daily. Takes a quarter tablet 03/15/14  Yes Robyn Haber, MD  aspirin EC 81 MG tablet Take 1 tablet (81 mg total) by mouth daily. 03/09/14  Yes Thurnell Lose, MD  Calcium Carbonate-Vitamin D (CALCIUM + D PO) Take 1 tablet by mouth daily.   Yes Historical Provider, MD  lisinopril (PRINIVIL,ZESTRIL) 5 MG tablet Take 5 mg by mouth daily.   Yes Historical Provider, MD  Multiple Vitamin (MULTIVITAMIN WITH MINERALS) TABS tablet Take 1 tablet by mouth every 3 (three) days.   Yes Historical Provider, MD    Family History: Family Status  Relation Status Death Age  . Father Deceased   . Sister Alive   . Mother Deceased   . Brother Deceased     Social History:   reports that she has never smoked. She does not have any smokeless tobacco history on file. She reports that she does not drink alcohol or use illicit drugs.   History   Social History Narrative   Patient lives with granddaughter, patient drinks caffeine daily.    Review of  Systems: She has been under weight. She has had some visual complaints. The review of systems is somewhat difficult but otherwise unremarkable.  Physical Exam: BP 133/80  Pulse 88  Temp(Src) 97.8 F (36.6 C) (Oral)  Resp 16  Ht 4' 11.06" (1.5 m)  Wt 44.453 kg (98 lb)  BMI 19.76 kg/m2  SpO2 87%  General appearance: Elderly thin pleasant [place] female in no acute distress Head: Normocephalic, without obvious abnormality, atraumatic Eyes: conjunctivae/corneas clear. PERRL,  EOM's intact. Fundi benign. Neck: Supple without masses no thyromegaly or carotid bruits Lungs: clear to auscultation bilaterally Heart: regular rate and rhythm, S1, S2 normal, no murmur, click, rub or gallop Abdomen: soft, non-tender; bowel sounds normal; no masses,  no organomegaly Pelvic: deferred Extremities: extremities normal, atraumatic, no cyanosis or edema Pulses: 2+ and symmetric Neurologic: Grossly normal   Labs: CBC  Recent Labs  04/13/14 0330  WBC 6.6  RBC 4.14  HGB 12.1  HCT 37.1  PLT 401*  MCV 89.6  MCH 29.2  MCHC 32.6  RDW 14.7   CMP   Recent Labs  04/13/14 0330  NA 141  K 4.1  CL 103  CO2 26  GLUCOSE 89  BUN 13  CREATININE 1.02  CALCIUM 8.8  GFRNONAA 49*  GFRAA 57*   BNP (last 3 results)  Cardiac Panel (last 3 results)  Recent Labs  04/13/14 1015  TROPONINI 1.65*     Radiology: Normal heart size, thoracic kyphoscoliosis, minimal calcification in descending aorta, no coronary calcification noted  EKG: Sinus rhythm, left axis deviation, no ischemic changes yesterday, no EKG available today yet  Signed:  W. Doristine Church MD [facility]   Cardiology Consultant  04/13/2014, 2:34 PM

## 2014-04-13 NOTE — Progress Notes (Signed)
Dr. Arnoldo Morale informed of pt's heart rate occurring around 39-42 bpm.  Pt sleeping currently.  No further orders at this time.  Will continue to monitor.  Iantha Fallen RN 3:20 AM 04/13/2014

## 2014-04-13 NOTE — Progress Notes (Signed)
  Echocardiogram 2D Echocardiogram has been performed.  Jill Castaneda FRANCES 04/13/2014, 5:47 PM

## 2014-04-13 NOTE — Progress Notes (Signed)
CRITICAL VALUE ALERT  Critical value received:  Troponin  Date of notification:  04/13/14  Time of notification:  7903  Critical value read back:yes  Nurse who received alert: Rebecka Apley RN  MD notified (1st page):  MD Present  Time of first page: MD present  MD notified (2nd page):MD Present  Time of second page:MD present  Responding MD:  Dr. Inis Sizer  Time MD responded:  Dr. Inis Sizer present and notified of patient's results

## 2014-04-13 NOTE — ED Provider Notes (Signed)
Medical screening examination/treatment/procedure(s) were conducted as a shared visit with non-physician practitioner(s) and myself.  I personally evaluated the patient during the encounter.   EKG Interpretation   Date/Time:  Friday April 12 2014 17:53:36 EDT Ventricular Rate:  64 PR Interval:  164 QRS Duration: 84 QT Interval:  408 QTC Calculation: 420 R Axis:   -29 Text Interpretation:  Normal sinus rhythm Normal ECG No prior EKG  Confirmed by Mingo Amber  MD, [place] ([id number]) on 04/12/2014 7:28:16 PM      51F with recent stroke, here with CP. Given NTG, but dropped her sats. No PE on CTA. Admitted to medicine.  Osvaldo Shipper, MD 04/13/14 [phone]

## 2014-04-13 NOTE — H&P (Signed)
[facility] History and Physical  Jill Castaneda NWG:[id number] DOB: 07-03-1928 DOA: 04/12/2014  Referring physician:  EDP PCP: No primary provider on file.  Specialists:   Chief Complaint:  Chest Pain  HPI: Jill Castaneda is a 80 y.o. female with a recent CVA , and history of HTN who presents to the ED with complaints of left sided chest pain radiating into her left arm, associated with SOB, that lasted 3.5 hours.  She stated that she could feel her heart beat in her chest, and at home her pulse was found to be 48.   The pain was pressure and heaviness like, and was not relieved until after she had been administered 3 NTGs, and given Aspirin RX, and IV Morphine x 1.   She rated the pain at a 7/10 at the worse and the pain occurred while she was walking for exercise and rehab therapy with her Grand-Daughter.   She was evaluated in the ED and her initial cardiac workup was negative and she was referred for further evaluation.  The history was obtained thought use of the  Grand-daughter who is translating for her Grandmother at bedside.     Review of Systems:  Constitutional: No Weight Loss, No Weight Gain, Night Sweats, Fevers, Chills, Fatigue, or Generalized Weakness HEENT: No Headaches, Difficulty Swallowing,Tooth/Dental Problems,Sore Throat,  No Sneezing, Rhinitis, Ear Ache, Nasal Congestion, or Post Nasal Drip,  Cardio-vascular:  +Chest pain, Orthopnea, PND, Edema in lower extremities, Anasarca, Dizziness, Palpitations  Resp:  +Dyspnea, No DOE,  Cough, No Hemoptysis, No Wheezing.    GI: No Heartburn, Indigestion, Abdominal Pain, Nausea, Vomiting, Diarrhea, Change in Bowel Habits,  Loss of Appetite  GU: No Dysuria, Change in Color of Urine, No Urgency or Frequency.  No flank pain.  Musculoskeletal: No Joint Pain or Swelling.  No Decreased Range of Motion. No Back Pain.  Neurologic: No Syncope, No Seizures, Muscle Weakness, Paresthesia, Vision Disturbance or Loss, No Diplopia, No Vertigo,  No Difficulty Walking,  Skin: No Rash or Lesions. Psych: No Change in Mood or Affect. No Depression or Anxiety. No Memory loss. No Confusion or Hallucinations   Past Medical History  Diagnosis Date  . Hypertension   . Osteoporosis   . Stroke   . Cataract       Past Surgical History  Procedure Laterality Date  . Total hip arthroplasty Right   . Shoulder hemi-arthroplasty Left   . Shoulder hardware removal Left   . Fracture surgery         Prior to Admission medications   Medication Sig Start Date End Date Taking? Authorizing Provider  amLODipine (NORVASC) 5 MG tablet Take 1.25 mg by mouth daily. Takes a quarter tablet 03/15/14  Yes Robyn Haber, MD  aspirin EC 81 MG tablet Take 1 tablet (81 mg total) by mouth daily. 03/09/14  Yes Thurnell Lose, MD  Calcium Carbonate-Vitamin D (CALCIUM + D PO) Take 1 tablet by mouth daily.   Yes Historical Provider, MD  lisinopril (PRINIVIL,ZESTRIL) 5 MG tablet Take 5 mg by mouth daily.   Yes Historical Provider, MD  Multiple Vitamin (MULTIVITAMIN WITH MINERALS) TABS tablet Take 1 tablet by mouth every 3 (three) days.   Yes Historical Provider, MD      No Known Allergies   Social History:  reports that she has never smoked. She does not have any smokeless tobacco history on file. She reports that she does not drink alcohol or use illicit drugs.     Family History  Problem  Relation Age of Onset  . Hypertension Father   . Heart disease Father   . Hypertension Sister   . Heart disease Sister   . Stroke Mother   . Cancer Brother        Physical Exam:  GEN:  Pleasant  80 y.o. female  examined  and in no acute distress; cooperative with exam Filed Vitals:   04/13/14 0000 04/13/14 0013 04/13/14 0030 04/13/14 0100  BP: 149/75 149/75 143/76 150/78  Pulse: 49 57 50 52  Temp:      TempSrc:      Resp: 12 17 15 16   SpO2: 97% 96% 96% 98%   Blood pressure 150/78, pulse 52, temperature 99.4 F (37.4 C), temperature source Oral,  resp. rate 16, SpO2 98.00%. PSYCH: She is alert and oriented x4; does not appear anxious does not appear depressed; affect is normal HEENT: Normocephalic and Atraumatic, Mucous membranes pink; PERRLA; EOM intact; Fundi:  Benign;  No scleral icterus, Nares: Patent, Oropharynx: Clear, Edentulous  With Dentures,  Neck:  FROM, no cervical lymphadenopathy nor thyromegaly or carotid bruit; no JVD; Breasts:: Not examined CHEST WALL: No tenderness CHEST:  Normal respiration, clear to auscultation bilaterally HEART: Bradycardic but regular rhythm; no murmurs rubs or gallops BACK: No kyphosis or scoliosis; no CVA tenderness ABDOMEN: Positive Bowel Sounds, soft non-tender; no masses, no organomegaly. Rectal Exam: Not done EXTREMITIES: No cyanosis, clubbing or edema; no ulcerations. Genitalia: not examined PULSES: 2+ and symmetric SKIN: Normal hydration no rash or ulceration CNS:  Alert and Oriented x 4,  Mild Right Hemiparesis, Gait Defered Vascular: pulses palpable throughout    Labs on Admission:  Basic Metabolic Panel:  Recent Labs Lab 04/12/14 1805  NA 141  K 4.5  CL 103  CO2 25  GLUCOSE 106*  BUN 16  CREATININE 1.10  CALCIUM 9.2   Liver Function Tests: No results found for this basename: AST, ALT, ALKPHOS, BILITOT, PROT, ALBUMIN,  in the last 168 hours No results found for this basename: LIPASE, AMYLASE,  in the last 168 hours No results found for this basename: AMMONIA,  in the last 168 hours CBC:  Recent Labs Lab 04/12/14 1805  WBC 8.4  HGB 13.2  HCT 40.6  MCV 89.8  PLT 421*   Cardiac Enzymes: No results found for this basename: CKTOTAL, CKMB, CKMBINDEX, TROPONINI,  in the last 168 hours  BNP (last 3 results) No results found for this basename: PROBNP,  in the last 8760 hours CBG: No results found for this basename: GLUCAP,  in the last 168 hours  Radiological Exams on Admission: Dg Chest 2 View  04/12/2014   CLINICAL DATA:  Chest pain, weakness  EXAM: CHEST  2  VIEW  COMPARISON:  03/08/2014  FINDINGS: Chronic interstitial markings/emphysematous changes. No focal consolidation. No pleural effusion or pneumothorax.  Heart is normal in size.  Degenerative changes of the visualized thoracolumbar spine. Exaggerated thoracic kyphosis.  IMPRESSION: No evidence of acute cardiopulmonary disease.   Electronically Signed   By: Julian Hy M.D.   On: 04/12/2014 20:49   Ct Angio Chest Pe W/cm &/or Wo Cm  04/13/2014   CLINICAL DATA:  Shortness of breath and chest pain.  EXAM: CT ANGIOGRAPHY CHEST WITH CONTRAST  TECHNIQUE: Multidetector CT imaging of the chest was performed using the standard protocol during bolus administration of intravenous contrast. Multiplanar CT image reconstructions and MIPs were obtained to evaluate the vascular anatomy.  CONTRAST:  4mL OMNIPAQUE IOHEXOL 350 MG/ML SOLN  COMPARISON:  Chest  radiograph performed earlier today at 8:34 p.m.  FINDINGS: There is no evidence of pulmonary embolus.  Bibasilar atelectasis is noted. The lungs are otherwise clear. There is no evidence of significant focal consolidation, pleural effusion or pneumothorax. No masses are identified; no abnormal focal contrast enhancement is seen.  The mediastinum is unremarkable in appearance. No mediastinal lymphadenopathy is seen. No pericardial effusion is identified. The great vessels are grossly unremarkable in appearance. No axillary lymphadenopathy is seen. The thyroid gland is unremarkable in appearance.  The visualized portions of the liver and spleen are unremarkable.  No acute osseous abnormalities are seen. There is chronic loss of height at vertebral body T8.  Review of the MIP images confirms the above findings.  IMPRESSION: 1. No evidence of pulmonary embolus. 2. Bibasilar atelectasis noted; lungs otherwise clear.   Electronically Signed   By: Garald Balding M.D.   On: 04/13/2014 00:03      EKG: Independently reviewed. Normal sinus Rhythm at 64, No acute S-T changes.       Assessment/Plan:   80 y.o. female with  Principal Problem:   Chest pain Active Problems:   CVA (cerebral infarction)   Unspecified essential hypertension   Hypertension    1.  Chest Pain- admit to 23 hour OBS/Telemetry Bed , cycle troponins,  Placed on Nitropaste, O2 and ASA Rx.   Consider Outpatient versus Inpatient Stress Testing.    2.  HTN-  Continue Amlodpine,  And Lisinopril Rx, Monitor BPs.     3.  CVA- recent - on ASA 81 mg daily.  Undergoing PT as outpatient.    4.   DVT Prophylaxis with Lovenox.         Code Status:   FULL CODE Family Communication:   Grand-daughter at Bedside Disposition Plan:      Observation/ Tele Bed   Time spent:  [facility] Pager [phone]  If 7PM-7AM, please contact night-coverage [email or web address] Password [email or web address] 04/13/2014, 1:49 AM

## 2014-04-13 NOTE — ED Notes (Signed)
Attempted report to floor.  

## 2014-04-14 LAB — TROPONIN I
TROPONIN I: 1.83 ng/mL — AB (ref <0.30)
Troponin I: 1.58 ng/mL (ref <0.30)

## 2014-04-14 MED ORDER — LEVALBUTEROL HCL 0.63 MG/3ML IN NEBU: 0.6300 mg | INHALATION_SOLUTION | Freq: Four times a day (QID) | RESPIRATORY_TRACT | Status: DC | PRN

## 2014-04-14 MED ORDER — LEVALBUTEROL HCL 0.63 MG/3ML IN NEBU
0.6300 mg | INHALATION_SOLUTION | Freq: Three times a day (TID) | RESPIRATORY_TRACT | Status: DC
  Filled 2014-04-14: qty 3

## 2014-04-14 NOTE — Progress Notes (Signed)
[facility] PROGRESS NOTE  Jill Castaneda IHW:[id number] DOB: 1927/11/12 DOA: 04/12/2014 PCP: No primary provider on file.  Assessment/Plan: 1. Chest Pain/non-ST elevation MI -Troponins elevated and EKG with new T-wave changes -Echo with EF 55% and subtle hypo-kinesis of the apical inferior segment -Cardiology discussing possible cath with granddaughter -For now Norvasc discontinued, patient started on metoprolol, Plavix and statin, continuing lisinopril and aspirin 81 2. HTN- Continue Lisinopril , metoprolol as above Monitor BPs.  3. CVA- recent - on ASA 81 mg daily. Undergoing PT as outpatient.  4. DVT Prophylaxis with Lovenox.  5. Hypoxia-CT angina negative for PE atelectasis noted -Will place on incentive spirometer, bronchodilators for now and follow,   Code Status: Full Family Communication: granddaughter at bedside Disposition Plan: Pending clinical course   Consultants:  cardiology  Procedures:  Echocardiogram Study Conclusions  - Left ventricle: Technically limited study. The study not as clear as the 03/2014 study. There may be subtle hypokinesis of the apical inferior segment. There is some septal dysynergy. The EF is 55%. The cavity size was normal. Wall thickness was increased in a pattern of mild LVH. Doppler parameters are consistent with abnormal left ventricular relaxation (grade 1 diastolic dysfunction). - Impressions: No cardiac source of embolism was identified, but cannot be ruled out on the basis of this examination.  Impressions:  - No cardiac source of embolism was identified, but cannot be ruled out on the basis of this examination.   Antibiotics:  None  HPI/Subjective: Patient denies chest pain also denies shortness of breath  Objective: Filed Vitals:   04/14/14 0804  BP: 120/75  Pulse: 69  Temp: 97.9 F (36.6 C)  Resp: 16    Intake/Output Summary (Last 24 hours) at 04/14/14 1143 Last data filed at 04/14/14 0804  Gross  per 24 hour  Intake    243 ml  Output      0 ml  Net    243 ml   Filed Weights   04/13/14 0155  Weight: 44.453 kg (98 lb)    Exam:  General: alert & appropriate In NAD Cardiovascular: RRR, nl S1 s2 Respiratory: Decreased breath sounds at the bases, no crackles or wheezes Abdomen: soft +BS NT/ND, no masses palpable Extremities: No cyanosis and no edema    Data Reviewed: Basic Metabolic Panel:  Recent Labs Lab 04/12/14 1805 04/13/14 0330 04/13/14 1510  NA 141 141 142  K 4.5 4.1 4.1  CL 103 103 102  CO2 25 26 27   GLUCOSE 106* 89 135*  BUN 16 13 15   CREATININE 1.10 1.02 1.15*  CALCIUM 9.2 8.8 9.4   Liver Function Tests:  Recent Labs Lab 04/13/14 1510  AST 36  ALT 11  ALKPHOS 52  BILITOT 1.1  PROT 6.7  ALBUMIN 3.2*   No results found for this basename: LIPASE, AMYLASE,  in the last 168 hours No results found for this basename: AMMONIA,  in the last 168 hours CBC:  Recent Labs Lab 04/12/14 1805 04/13/14 0330  WBC 8.4 6.6  HGB 13.2 12.1  HCT 40.6 37.1  MCV 89.8 89.6  PLT 421* 401*   Cardiac Enzymes:  Recent Labs Lab 04/13/14 1015 04/13/14 1510 04/13/14 2220 04/14/14 0431  TROPONINI 1.65* 2.28* 1.83* 1.58*   BNP (last 3 results) No results found for this basename: PROBNP,  in the last 8760 hours CBG: No results found for this basename: GLUCAP,  in the last 168 hours  No results found for this or any previous visit (from the past 240  hour(s)).   Studies: Dg Chest 2 View  04/12/2014   CLINICAL DATA:  Chest pain, weakness  EXAM: CHEST  2 VIEW  COMPARISON:  03/08/2014  FINDINGS: Chronic interstitial markings/emphysematous changes. No focal consolidation. No pleural effusion or pneumothorax.  Heart is normal in size.  Degenerative changes of the visualized thoracolumbar spine. Exaggerated thoracic kyphosis.  IMPRESSION: No evidence of acute cardiopulmonary disease.   Electronically Signed   By: Julian Hy M.D.   On: 04/12/2014 20:49   Ct  Angio Chest Pe W/cm &/or Wo Cm  04/13/2014   CLINICAL DATA:  Shortness of breath and chest pain.  EXAM: CT ANGIOGRAPHY CHEST WITH CONTRAST  TECHNIQUE: Multidetector CT imaging of the chest was performed using the standard protocol during bolus administration of intravenous contrast. Multiplanar CT image reconstructions and MIPs were obtained to evaluate the vascular anatomy.  CONTRAST:  79mL OMNIPAQUE IOHEXOL 350 MG/ML SOLN  COMPARISON:  Chest radiograph performed earlier today at 8:34 p.m.  FINDINGS: There is no evidence of pulmonary embolus.  Bibasilar atelectasis is noted. The lungs are otherwise clear. There is no evidence of significant focal consolidation, pleural effusion or pneumothorax. No masses are identified; no abnormal focal contrast enhancement is seen.  The mediastinum is unremarkable in appearance. No mediastinal lymphadenopathy is seen. No pericardial effusion is identified. The great vessels are grossly unremarkable in appearance. No axillary lymphadenopathy is seen. The thyroid gland is unremarkable in appearance.  The visualized portions of the liver and spleen are unremarkable.  No acute osseous abnormalities are seen. There is chronic loss of height at vertebral body T8.  Review of the MIP images confirms the above findings.  IMPRESSION: 1. No evidence of pulmonary embolus. 2. Bibasilar atelectasis noted; lungs otherwise clear.   Electronically Signed   By: Garald Balding M.D.   On: 04/13/2014 00:03    Scheduled Meds: . aspirin EC  81 mg Oral Daily  . atorvastatin  40 mg Oral q1800  . calcium-vitamin D  1 tablet Oral Q breakfast  . clopidogrel  75 mg Oral Q breakfast  . enoxaparin (LOVENOX) injection  30 mg Subcutaneous Daily  . lisinopril  5 mg Oral Daily  . metoprolol tartrate  12.5 mg Oral BID  .  morphine injection  4 mg Intravenous Once  . multivitamin with minerals  1 tablet Oral Q3 days  . sodium chloride  3 mL Intravenous Q12H  . sodium chloride  3 mL Intravenous Q12H    Continuous Infusions:   Principal Problem:   Non-STEMI (non-ST elevated myocardial infarction) Active Problems:   CVA (cerebral infarction)   Unspecified essential hypertension   Chest pain   Hypertension    Time spent: [facility] Pager [phone]. If 7PM-7AM, please contact night-coverage at [email or web address], password [facility] 04/14/2014, 11:43 AM  LOS: 2 days

## 2014-04-14 NOTE — Progress Notes (Signed)
Patient and Family do not want to do [facility] unless patient is symtomatic with SOB. They don't want her to take any extra medications.  I explained to them that they could call if needed. Will monitor as needed.

## 2014-04-14 NOTE — Progress Notes (Signed)
Subjective:  History obtained from the granddaughter at bedside. Patient has had no chest pain overnight. Granddaughter has a pulse oximeter that she is using at the bedside and continues oxygen back on her because of pulse ox is low. The patient has no complaints of shortness of breath. She has had no recurrence of chest pain. She is developed T-wave inversions anterolaterally and the troponins are coming down.  Objective:  Vital Signs in the last 24 hours: BP 130/73  Pulse 77  Temp(Src) 99 F (37.2 C) (Oral)  Resp 16  Ht 4' 11.06" (1.5 m)  Wt 44.453 kg (98 lb)  BMI 19.76 kg/m2  SpO2 91%  Physical Exam: Elderly thin female in no acute distress wearing oxygen Lungs:  Clear Cardiac:  Regular rhythm, normal S1 and S2, no S3 Abdomen:  Soft, nontender, no masses Extremities:  No edema present  Intake/Output from previous day: 06/13 0701 - 06/14 0700 In: 183 [P.O.:180; I.V.:3] Out: 650 [Urine:650]  Weight Filed Weights   04/13/14 0155  Weight: 44.453 kg (98 lb)    Lab Results: Basic Metabolic Panel:  Recent Labs  04/13/14 0330 04/13/14 1510  NA 141 142  K 4.1 4.1  CL 103 102  CO2 26 27  GLUCOSE 89 135*  BUN 13 15  CREATININE 1.02 1.15*   CBC:  Recent Labs  04/12/14 1805 04/13/14 0330  WBC 8.4 6.6  HGB 13.2 12.1  HCT 40.6 37.1  MCV 89.8 89.6  PLT 421* 401*   Cardiac Enzymes:  Recent Labs  04/13/14 1510 04/13/14 2220 04/14/14 0431  TROPONINI 2.28* 1.83* 1.58*    Telemetry: Sinus rhythm  Assessment/Plan:  1. Non-ST elevation myocardial infarction with new T-wave changes laterally and abnormal troponins 2. Hypertension 3. Recent stroke  Recommendations:  Long discussion with granddaughter at the side of the room. I am going to place the hospital oximetry on her to determine if she is having low oxygen levels or not. The echocardiogram showed an ejection fraction of 55% with questionable subtle inferoapical changes. Ideally cardiac  catheterization would be the next step. She has had a recent stroke it increases risk of intervention somewhat. I spoke about this a long time with the granddaughter and she would prefer to have a discussion with her mother who is not in the country as well as her grandmother to determine if they want to go with a catheterization or not. We'll ask the case manager also to be involved. In the meantime discontinue amlodipine and ask for cardiac rehabilitation to be involved.     Kerry Hough  MD [facility] Cardiology  04/14/2014, 12:07 PM

## 2014-04-15 DIAGNOSIS — I214 Non-ST elevation (NSTEMI) myocardial infarction: Secondary | ICD-10-CM | POA: Diagnosis present

## 2014-04-15 NOTE — Progress Notes (Addendum)
Subjective:  History obtained through granddaughter at bedside. She had a good night and has had no recurrent ischemic chest pain. She does come back from walking with rehabilitation. She has not had any further oxygen desaturations overnight. After extensive discussion with her granddaughter she has elected to be treated medically and to not have cardiac catheterization because of her age. Evidently she is 69 and not 86 according to the family  Objective:  Vital Signs in the last 24 hours: BP 111/65  Pulse 50  Temp(Src) 97.6 F (36.4 C) (Oral)  Resp 16  Ht 4' 11.06" (1.5 m)  Wt 44.453 kg (98 lb)  BMI 19.76 kg/m2  SpO2 95%  Physical Exam: Elderly thin female in no acute distress Lungs:  Clear Cardiac:  Regular rhythm, normal S1 and S2, no S3 Abdomen:  Soft, nontender, no masses Extremities:  No edema present  Intake/Output from previous day: 06/14 0701 - 06/15 0700 In: 723 [P.O.:720; I.V.:3] Out: -   Weight Filed Weights   04/13/14 0155 04/15/14 0500  Weight: 44.453 kg (98 lb) 44.453 kg (98 lb)    Lab Results: Basic Metabolic Panel:  Recent Labs  04/13/14 0330 04/13/14 1510  NA 141 142  K 4.1 4.1  CL 103 102  CO2 26 27  GLUCOSE 89 135*  BUN 13 15  CREATININE 1.02 1.15*   CBC:  Recent Labs  04/12/14 1805 04/13/14 0330  WBC 8.4 6.6  HGB 13.2 12.1  HCT 40.6 37.1  MCV 89.8 89.6  PLT 421* 401*   Cardiac Enzymes:  Recent Labs  04/13/14 1510 04/13/14 2220 04/14/14 0431  TROPONINI 2.28* 1.83* 1.58*    Telemetry: Sinus rhythm  Assessment/Plan:  1. Non-ST elevation myocardial infarction with new T-wave changes laterally and abnormal troponins 2. Hypertension 3. Recent stroke  Recommendations:  Tolerating metoprolol, Plavix and other medicines just fine. No recurrent pain. Long discussion about cardiac catheterization in the setting of the recent non-STEMI and at her age she would like to see how she does on medications. This is not unreasonable  in light of her age and foreign residence in its clearly what she wants to do this time. I would get her to walk around today with rehabilitation and if things are stable she could go home in the morning.  Kerry Hough  MD [facility] Cardiology  04/15/2014, 9:06 AM

## 2014-04-15 NOTE — Progress Notes (Signed)
[facility] PROGRESS NOTE  Jill Castaneda FHL:[id number] DOB: 01/13/1926 DOA: 04/12/2014 PCP: No primary provider on file.  Assessment/Plan: 1. Chest Pain/non-ST elevation MI -Troponins elevated and EKG with new T-wave changes -Echo with EF 55% and subtle hypo-kinesis of the apical inferior segment -For now Norvasc discontinued, patient started on metoprolol, Plavix and statin, continuing lisinopril and aspirin 81 -Patient/granddaughter declined cardiac cath for now>> cards to monitor and current meds, cardiac rehabilitation and possible DC in a.m. 2. HTN- Continue Lisinopril , metoprolol as above Monitor BPs.  3. CVA- recent - on ASA 81 mg daily. Undergoing PT as outpatient.  4. DVT Prophylaxis with Lovenox.  5. Hypoxia-CT angina negative for PE atelectasis noted -Resolved with incentive spirometry, O2 sats in the 90s on room air   Code Status: Full Family Communication: granddaughter at bedside Disposition Plan: Pending clinical course   Consultants:  cardiology  Procedures:  Echocardiogram Study Conclusions  - Left ventricle: Technically limited study. The study not as clear as the 03/2014 study. There may be subtle hypokinesis of the apical inferior segment. There is some septal dysynergy. The EF is 55%. The cavity size was normal. Wall thickness was increased in a pattern of mild LVH. Doppler parameters are consistent with abnormal left ventricular relaxation (grade 1 diastolic dysfunction). - Impressions: No cardiac source of embolism was identified, but cannot be ruled out on the basis of this examination.  Impressions:  - No cardiac source of embolism was identified, but cannot be ruled out on the basis of this examination.   Antibiotics:  None  HPI/Subjective: Doing well today, breathing much better. Sitting up in chair and denies chest pain.  Objective: Filed Vitals:   04/15/14 0500  BP: 111/65  Pulse: 50  Temp: 97.6 F (36.4 C)  Resp: 16     Intake/Output Summary (Last 24 hours) at 04/15/14 1053 Last data filed at 04/15/14 0900  Gross per 24 hour  Intake    723 ml  Output      0 ml  Net    723 ml   Filed Weights   04/13/14 0155 04/15/14 0500  Weight: 44.453 kg (98 lb) 44.453 kg (98 lb)    Exam:  General: alert & appropriate In NAD Cardiovascular: RRR, nl S1 s2 Respiratory: Decreased breath sounds at the bases, no crackles or wheezes Abdomen: soft +BS NT/ND, no masses palpable Extremities: No cyanosis and no edema    Data Reviewed: Basic Metabolic Panel:  Recent Labs Lab 04/12/14 1805 04/13/14 0330 04/13/14 1510  NA 141 141 142  K 4.5 4.1 4.1  CL 103 103 102  CO2 25 26 27   GLUCOSE 106* 89 135*  BUN 16 13 15   CREATININE 1.10 1.02 1.15*  CALCIUM 9.2 8.8 9.4   Liver Function Tests:  Recent Labs Lab 04/13/14 1510  AST 36  ALT 11  ALKPHOS 52  BILITOT 1.1  PROT 6.7  ALBUMIN 3.2*   No results found for this basename: LIPASE, AMYLASE,  in the last 168 hours No results found for this basename: AMMONIA,  in the last 168 hours CBC:  Recent Labs Lab 04/12/14 1805 04/13/14 0330  WBC 8.4 6.6  HGB 13.2 12.1  HCT 40.6 37.1  MCV 89.8 89.6  PLT 421* 401*   Cardiac Enzymes:  Recent Labs Lab 04/13/14 1015 04/13/14 1510 04/13/14 2220 04/14/14 0431  TROPONINI 1.65* 2.28* 1.83* 1.58*   BNP (last 3 results) No results found for this basename: PROBNP,  in the last 8760 hours CBG: No  results found for this basename: GLUCAP,  in the last 168 hours  No results found for this or any previous visit (from the past 240 hour(s)).   Studies: No results found.  Scheduled Meds: . aspirin EC  81 mg Oral Daily  . atorvastatin  40 mg Oral q1800  . calcium-vitamin D  1 tablet Oral Q breakfast  . clopidogrel  75 mg Oral Q breakfast  . enoxaparin (LOVENOX) injection  30 mg Subcutaneous Daily  . lisinopril  5 mg Oral Daily  . metoprolol tartrate  12.5 mg Oral BID  .  morphine injection  4 mg  Intravenous Once  . multivitamin with minerals  1 tablet Oral Q3 days  . sodium chloride  3 mL Intravenous Q12H  . sodium chloride  3 mL Intravenous Q12H   Continuous Infusions:   Principal Problem:   Non-STEMI (non-ST elevated myocardial infarction) Active Problems:   CVA (cerebral infarction)   Unspecified essential hypertension   Chest pain   Hypertension    Time spent: [facility] Pager [phone]. If 7PM-7AM, please contact night-coverage at [email or web address], password [facility] 04/15/2014, 10:53 AM  LOS: 3 days

## 2014-04-15 NOTE — Progress Notes (Signed)
UR Completed.  Aleaha Fickling Jane [phone] 04/15/2014  

## 2014-04-15 NOTE — Progress Notes (Addendum)
CARDIAC REHAB PHASE I   PRE:  Rate/Rhythm: 72 SR  BP:  Sitting: 108/76      SaO2: 92 RA  MODE:  Ambulation: 550 ft   POST:  Rate/Rhythm: 72 SR  BP:  Sitting: 118/80     SaO2: 95 RA [id number] Patient ambulated x 1 assist with RW. Steady gait noted. Granddaughter at side during ambulation for translation. Per granddaughter, patient denied SOB or CP. Continuous ambulatory sats remained >91% on RA. Post ambulation, Dr. Oran Rein at bedside to assess patient. Patient sitting on bedside with call bell in reach and family at her side.  Jill Castaneda, BSN 04/15/2014 8:49 AM

## 2014-04-15 NOTE — Evaluation (Addendum)
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 May 9 pt had a left parietal CVA , prior right frontal and cerebellar CVA and pt with continued balance impairment, left eye ptosis, and vertical diplopia from CVA. Admitted 6/12 with chest pain and NSTEMI with family declining cath due to age 80 Clinical Impression  Pt and family both very pleasant. Pt moving well and recovering from NSTEMI with education of Borg scale and modified activity. Pt and granddgtr greatest concerns still relate to recent CVA in May with pt impaired balance and diplopia. Discussed with family fine motor exercises for RUE as well as potential benefit of spot occlusion, attempted with finger occluding vision but difficulty getting pt to understand concept and would perform better with attempt at glasses with OT notified. Glenda Chroman, is a first year [employer] Ship broker and educated her for balance exercises as pt already performing upper and lower body strengthening and fine motor activities. Pt will benefit from acute therapy to maximize pt and family education for balance, strength and endurance training given lack of carry over of therapies without insurance. Discussed home management strategies as well given [name] is caring for pt, her 80yo, in school full-time, and feeling overwhelmed and needing constant help without insurance for pt to assist.    Follow Up Recommendations Outpatient PT    Equipment Recommendations  None recommended by PT    Recommendations for Other Services       Precautions / Restrictions Precautions Precautions: Fall Precaution Comments: left eye ptosis, vertical diplopia, posterior right LOB      Mobility  Bed Mobility               General bed mobility comments: pt in chair on  arrival  Transfers Overall transfer level: Needs assistance   Transfers: Sit to/from Stand Sit to Stand: Supervision         General transfer comment: supervision for safety  Ambulation/Gait Ambulation/Gait assistance: Min assist Ambulation Distance (Feet): 200 Feet Assistive device: None Gait Pattern/deviations: Step-through pattern;Scissoring   Gait velocity interpretation: Below normal speed for age/gender General Gait Details: attempted gait with RW with pt demonstrating posterior right LOB grossly 10x with min assist to correct, pt scissoring at times and pausing to recover after LOB. Granddgtr educated for guarding from posterior right and continued RW use for all gait at home  Stairs            Wheelchair Mobility    Modified Rankin (Stroke Patients Only)       Balance Overall balance assessment: Needs assistance   Sitting balance-Leahy Scale: Fair       Standing balance-Leahy Scale: Fair                               Pertinent Vitals/Pain No pain HR 53    Home Living Family/patient expects to be discharged to:: Private residence Living Arrangements: Other relatives Available Help at Discharge: Family;Friend(s) Type of Home: House Home Access: Stairs to enter   [employer] of Steps: 4 Home Layout: Two level;Able to live on main level with bedroom/bathroom Home Equipment: Gilford Rile - 2 wheels      Prior Function Level of Independence: Needs assistance   Gait / Transfers Assistance Needed: since CVA pt has been walking  supervision with RW, attempted quad cane without success  ADL's / Homemaking Assistance Needed: family performs housework, granddgtr has been assisting with bathing and dressing supervision-min assist to prevent LOB        Hand Dominance        Extremity/Trunk Assessment   Upper Extremity Assessment: Defer to OT evaluation (gross strength equal but noted deficit with right hand fine motor)            Lower Extremity Assessment: Overall WFL for tasks assessed      Cervical / Trunk Assessment: Kyphotic  Communication   Communication: Prefers language other than English (granddgtr present and interpreting)  Cognition Arousal/Alertness: Awake/alert Behavior During Therapy: WFL for tasks assessed/performed Overall Cognitive Status: Within Functional Limits for tasks assessed                      General Comments      Exercises        Assessment/Plan    PT Assessment Patient needs continued PT services  PT Diagnosis Abnormality of gait   PT Problem List Decreased balance;Decreased mobility  PT Treatment Interventions Gait training;Functional mobility training;Neuromuscular re-education;Therapeutic activities;Balance training   PT Goals (Current goals can be found in the Care Plan section) Acute Rehab PT Goals Patient Stated Goal: to be independent without double vision PT Goal Formulation: With patient/family Time For Goal Achievement: 04/22/14 Potential to Achieve Goals: Good    Frequency Min 3X/week   Barriers to discharge        Co-evaluation               End of Session   Activity Tolerance: Patient tolerated treatment well Patient left: in chair;with call bell/phone within reach Nurse Communication: Mobility status;Precautions         Time: 9150-5697 PT Time Calculation (min): 40 min   Charges:   PT Evaluation $Initial PT Evaluation Tier I: 1 Procedure PT Treatments $Therapeutic Activity: 8-22 mins $Self Care/Home Management: 8-22   PT G Codes:          Melford Aase 04/15/2014, 11:34 AM  Elwyn Reach, [place]

## 2014-04-15 NOTE — Evaluation (Signed)
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 May 9 pt had a left parietal CVA , prior right frontal and cerebellar CVA and pt with continued balance impairment, left eye ptosis, and vertical diplopia from CVA. Admitted 6/12 with chest pain and NSTEMI with family declining cath due to age   Clinical Impression   PT admitted with Nstemi. Pt currently with functional limitiations due to the deficits listed below (see OT problem list). Pt very concerned with diplopia and has had difficulty locating a eye doctor to help with diplopia. Granddaughter requesting more information. Pt will benefit from skilled OT to increase their independence and safety with adls and balance to allow discharge outpatient. Ot to follow acutely to address diplopia and adl retraining.     Follow Up Recommendations  Outpatient OT    Equipment Recommendations  None recommended by OT    Recommendations for Other Services       Precautions / Restrictions Precautions Precautions: Fall Precaution Comments: left eye ptosis, vertical diplopia, posterior right LOB      Mobility Bed Mobility                  Transfers                      Balance                                            ADL                                         General ADL Comments: Session focused on concerns with diplopia. pt currently with eye patch. pt asked to remove patch and required extended time and tactile input to open Lt eye lid. pt with ptosis that is less than half mask. Pt reports diplopia with Rt visual scanning. LT eye does not track medially. pt provided plastic glasses with taping ( spot occlusion). pt reports one in all quadrants tracking. PT has exercises provided by OT  Deidre Ala previous admission and granddaughter ( first year PT student ) present in room to assist. Granddaughter translating throughout session. Pt extremely pleased with glasses.      Vision                 Additional Comments: Lt eye does not track medially. Diplopia is vertical   Perception     Praxis      Pertinent Vitals/Pain VSS     Hand Dominance Right   Extremity/Trunk Assessment Upper Extremity Assessment Upper Extremity Assessment: Overall WFL for tasks assessed   Lower Extremity Assessment Lower Extremity Assessment: Overall WFL for tasks assessed   Cervical / Trunk Assessment Cervical / Trunk Assessment: Kyphotic   Communication Communication Communication: Prefers language other than English   Cognition Arousal/Alertness: Awake/alert Behavior During Therapy: WFL for tasks assessed/performed Overall Cognitive Status: Within Functional Limits for tasks assessed                     General Comments       Exercises  Shoulder Instructions      Home Living Family/patient expects to be discharged to:: Private residence Living Arrangements: Other relatives Available Help at Discharge: Family;Friend(s) Type of Home: House Home Access: Stairs to enter Entrance Stairs-Number of Steps: 4   Home Layout: Two level;Able to live on main level with bedroom/bathroom               Home Equipment: Walker - 2 wheels          Prior Functioning/Environment Level of Independence: Needs assistance  Gait / Transfers Assistance Needed: since CVA pt has been walking supervision with RW, attempted quad cane without success ADL's / Homemaking Assistance Needed: family performs housework, granddgtr has been assisting with bathing and dressing supervision-min assist to prevent LOB        OT Diagnosis: Generalized weakness;Disturbance of vision   OT Problem List: Decreased strength;Decreased activity tolerance;Impaired balance (sitting and/or  standing);Decreased safety awareness;Decreased knowledge of use of DME or AE;Decreased knowledge of precautions   OT Treatment/Interventions: Self-care/ADL training;Therapeutic exercise;DME and/or AE instruction;Visual/perceptual remediation/compensation;Patient/family education;Balance training    OT Goals(Current goals can be found in the care plan section) Acute Rehab OT Goals Patient Stated Goal: to be independent without double vision OT Goal Formulation: With patient/family Time For Goal Achievement: 04/29/14 Potential to Achieve Goals: Good  OT Frequency: Min 2X/week   Barriers to D/C:            Co-evaluation              End of Session Nurse Communication: Mobility status;Precautions  Activity Tolerance: Patient tolerated treatment well Patient left: in bed;with call bell/phone within reach   Time: 1511-1535 OT Time Calculation (min): 24 min Charges:  OT General Charges $OT Visit: 1 Procedure OT Evaluation $Initial OT Evaluation Tier I: 1 Procedure OT Treatments $Therapeutic Activity: 8-22 mins G-Codes:    Peri Maris 2014-04-22, 4:02 PM Pager: [phone]

## 2014-04-16 MED ORDER — CARVEDILOL 3.125 MG PO TABS
3.1250 mg | ORAL_TABLET | Freq: Two times a day (BID) | ORAL | Status: DC
  Filled 2014-04-16 (×2): qty 1

## 2014-04-16 MED ORDER — LISINOPRIL 2.5 MG PO TABS: 2.5000 mg | ORAL_TABLET | Freq: Every day | ORAL | Status: DC

## 2014-04-16 MED ORDER — ATORVASTATIN CALCIUM 40 MG PO TABS: 40.0000 mg | ORAL_TABLET | Freq: Every day | ORAL | Status: DC

## 2014-04-16 MED ORDER — CARVEDILOL 3.125 MG PO TABS: 3.1250 mg | ORAL_TABLET | Freq: Two times a day (BID) | ORAL | Status: DC

## 2014-04-16 MED ORDER — LISINOPRIL 2.5 MG PO TABS
2.5000 mg | ORAL_TABLET | Freq: Every day | ORAL | Status: DC
  Administered 2014-04-16: 2.5 mg via ORAL
  Filled 2014-04-16: qty 1

## 2014-04-16 MED ORDER — CLOPIDOGREL BISULFATE 75 MG PO TABS: 75.0000 mg | ORAL_TABLET | Freq: Every day | ORAL | Status: DC

## 2014-04-16 NOTE — Discharge Summary (Addendum)
Physician Discharge Summary  Jill Castaneda RXV:[id number] DOB: 01/13/1926 DOA: 04/12/2014  PCP: No primary provider on file.  Admit date: 04/12/2014 Discharge date: 04/16/2014  Time spent: >30 minutes  Recommendations for Outpatient Follow-up:  Follow-up Information   Follow up with TILLEY JR,W SPENCER, MD In 2 weeks.   Specialty:  Cardiology   Contact information:   [address] [place] [address] [phone]       Please follow up. (PCP in 1-2weeks, call for appt upon discharge)        Discharge Diagnoses:  Principal Problem:   Non-STEMI (non-ST elevated myocardial infarction) Active Problems:   CVA (cerebral infarction)   Unspecified essential hypertension   Chest pain   Hypertension   NSTEMI (non-ST elevated myocardial infarction)   Discharge Condition: Improved/stable   Filed Weights   04/13/14 0155 04/15/14 0500 04/16/14 0500  Weight: 44.453 kg (98 lb) 44.453 kg (98 lb) 43.409 kg (95 lb 11.2 oz)    History of present illness:  Jill Castaneda is a 80 y.o. female with a recent CVA , and history of HTN who presents to the ED with complaints of left sided chest pain radiating into her left arm, associated with SOB, that lasted 3.5 hours. She stated that she could feel her heart beat in her chest, and at home her pulse was found to be 48. The pain was pressure and heaviness like, and was not relieved until after she had been administered 3 NTGs, and given Aspirin RX, and IV Morphine x 1. She rated the pain at a 7/10 at the worse and the pain occurred while she was walking for exercise and rehab therapy with her Jill Castaneda. She was evaluated in the ED and her initial cardiac workup was negative and she was referred for further evaluation. The history was obtained thought use of the  Jill Castaneda who is translating for her Jill Castaneda at bedside.  She was admitted for further evaluation and management.   Hospital Course:  1. Chest Pain/non-ST  elevation MI  -Discussed above, upon admission Troponins were cycled and came back elevated and EKG with new T-wave changes  -Echo was done and showed EF 55% and subtle hypo-kinesis of the apical inferior segment  -Cardiology was consulted and followed patient while in the hospital. -Dr. Wynonia Lawman saw patient and recommended cardiac cath-Patient/granddaughter declined cardiac cath for now>> to followup. Patient to follow up outpt and reconsider -She was patient medically per cards Norvasc discontinued, patient started on metoprolol developed bradycardia and this was changed to a low dose Coreg, Plavix and statin were added, and her lisinopril decreased as a blood pressures were borderline. She was maintained on aspirin 81 mg. -She has remained chest pain-free, she is to follow up outpatient with Dr. Wynonia Lawman in 2 weeks -Cardiac rehabilitation was consulted and saw patient in hospital  2. HTN- Continue Coreg and lisinopril as above 3. CVA- recent - on ASA 81 mg daily. .  5. Hypoxia-CT angina negative for PE atelectasis noted  -Resolved with incentive spirometry, O2 sats in the 90s on room air   Procedures:  2-D echo Study Conclusions  - Left ventricle: Technically limited study. The study not as clear as the 03/2014 study. There may be subtle hypokinesis of the apical inferior segment. There is some septal dysynergy. The EF is 55%. The cavity size was normal. Wall thickness was increased in a pattern of mild LVH. Doppler parameters are consistent with abnormal left ventricular relaxation (grade 1 diastolic dysfunction). - Impressions:  No cardiac source of embolism was identified, but cannot be ruled out on the basis of this examination.  Impressions:  - No cardiac source of embolism was identified, but cannot be ruled out on the basis of this examination.  Consultations:  Cardiology  Discharge Exam: Filed Vitals:   04/16/14 0500  BP: 133/69  Pulse: 49  Temp: 97.4 F (36.3 C)   Resp: 16   Exam:  General: alert & appropriate In NAD  Cardiovascular: RRR, nl S1 s2  Respiratory: Decreased breath sounds at the bases, no crackles or wheezes  Abdomen: soft +BS NT/ND, no masses palpable  Extremities: No cyanosis and no edema    Discharge Instructions You were cared for by a hospitalist during your hospital stay. If you have any questions about your discharge medications or the care you received while you were in the hospital after you are discharged, you can call the unit and asked to speak with the hospitalist on call if the hospitalist that took care of you is not available. Once you are discharged, your primary care physician will handle any further medical issues. Please note that NO REFILLS for any discharge medications will be authorized once you are discharged, as it is imperative that you return to your primary care physician (or establish a relationship with a primary care physician if you do not have one) for your aftercare needs so that they can reassess your need for medications and monitor your lab values.  Discharge Instructions   Diet - low sodium heart healthy    Complete by:  As directed      Increase activity slowly    Complete by:  As directed             Medication List    STOP taking these medications       amLODipine 5 MG tablet  Commonly known as:  NORVASC      TAKE these medications       aspirin EC 81 MG tablet  Take 1 tablet (81 mg total) by mouth daily.     atorvastatin 40 MG tablet  Commonly known as:  LIPITOR  Take 1 tablet (40 mg total) by mouth daily at 6 PM.     CALCIUM + D PO  Take 1 tablet by mouth daily.     clopidogrel 75 MG tablet  Commonly known as:  PLAVIX  Take 1 tablet (75 mg total) by mouth daily with breakfast.     lisinopril 2.5 MG tablet  Commonly known as:  PRINIVIL,ZESTRIL  Take 1 tablet (2.5 mg total) by mouth daily.     multivitamin with minerals Tabs tablet  Take 1 tablet by mouth every 3  (three) days.       Coreg 3.125 mg by mouth twice a day   No Known Allergies     Follow-up Information   Follow up with TILLEY JR,W SPENCER, MD In 2 weeks.   Specialty:  Cardiology   Contact information:   [address] [place] [address] [phone]       Please follow up. (PCP in 1-2weeks, call for appt upon discharge)        The results of significant diagnostics from this hospitalization (including imaging, microbiology, ancillary and laboratory) are listed below for reference.    Significant Diagnostic Studies: Dg Chest 2 View  04/12/2014   CLINICAL DATA:  Chest pain, weakness  EXAM: CHEST  2 VIEW  COMPARISON:  03/08/2014  FINDINGS: Chronic interstitial  markings/emphysematous changes. No focal consolidation. No pleural effusion or pneumothorax.  Heart is normal in size.  Degenerative changes of the visualized thoracolumbar spine. Exaggerated thoracic kyphosis.  IMPRESSION: No evidence of acute cardiopulmonary disease.   Electronically Signed   By: Julian Hy M.D.   On: 04/12/2014 20:49   Ct Angio Chest Pe W/cm &/or Wo Cm  04/13/2014   CLINICAL DATA:  Shortness of breath and chest pain.  EXAM: CT ANGIOGRAPHY CHEST WITH CONTRAST  TECHNIQUE: Multidetector CT imaging of the chest was performed using the standard protocol during bolus administration of intravenous contrast. Multiplanar CT image reconstructions and MIPs were obtained to evaluate the vascular anatomy.  CONTRAST:  66mL OMNIPAQUE IOHEXOL 350 MG/ML SOLN  COMPARISON:  Chest radiograph performed earlier today at 8:34 p.m.  FINDINGS: There is no evidence of pulmonary embolus.  Bibasilar atelectasis is noted. The lungs are otherwise clear. There is no evidence of significant focal consolidation, pleural effusion or pneumothorax. No masses are identified; no abnormal focal contrast enhancement is seen.  The mediastinum is unremarkable in appearance. No mediastinal lymphadenopathy is seen. No pericardial  effusion is identified. The great vessels are grossly unremarkable in appearance. No axillary lymphadenopathy is seen. The thyroid gland is unremarkable in appearance.  The visualized portions of the liver and spleen are unremarkable.  No acute osseous abnormalities are seen. There is chronic loss of height at vertebral body T8.  Review of the MIP images confirms the above findings.  IMPRESSION: 1. No evidence of pulmonary embolus. 2. Bibasilar atelectasis noted; lungs otherwise clear.   Electronically Signed   By: Garald Balding M.D.   On: 04/13/2014 00:03    Microbiology: No results found for this or any previous visit (from the past 240 hour(s)).   Labs: Basic Metabolic Panel:  Recent Labs Lab 04/12/14 1805 04/13/14 0330 04/13/14 1510  NA 141 141 142  K 4.5 4.1 4.1  CL 103 103 102  CO2 25 26 27   GLUCOSE 106* 89 135*  BUN 16 13 15   CREATININE 1.10 1.02 1.15*  CALCIUM 9.2 8.8 9.4   Liver Function Tests:  Recent Labs Lab 04/13/14 1510  AST 36  ALT 11  ALKPHOS 52  BILITOT 1.1  PROT 6.7  ALBUMIN 3.2*   No results found for this basename: LIPASE, AMYLASE,  in the last 168 hours No results found for this basename: AMMONIA,  in the last 168 hours CBC:  Recent Labs Lab 04/12/14 1805 04/13/14 0330  WBC 8.4 6.6  HGB 13.2 12.1  HCT 40.6 37.1  MCV 89.8 89.6  PLT 421* 401*   Cardiac Enzymes:  Recent Labs Lab 04/13/14 1015 04/13/14 1510 04/13/14 2220 04/14/14 0431  TROPONINI 1.65* 2.28* 1.83* 1.58*   BNP: BNP (last 3 results) No results found for this basename: PROBNP,  in the last 8760 hours CBG: No results found for this basename: GLUCAP,  in the last 168 hours     Signed:  VIYUOH,ADELINE C  [facility] 04/16/2014, 10:27 AM

## 2014-04-16 NOTE — Progress Notes (Signed)
Pts assessment unchanged from this am. D/c'd with granddaughter to private vehicle in wheelchair in stable condition

## 2014-04-16 NOTE — Progress Notes (Signed)
Subjective:  Patient has had no recurrent chest pain. Her metoprolol was held due to significant bradycardia overnight. She has been able to ambulate and is very desirous of being discharged from the hospital today. Her granddaughter is a physical therapy student and is quite attentive and says that she will monitor her pulse at home. We're to change her from metoprolol to carvedilol at a very low dose.  Objective:  Vital Signs in the last 24 hours: BP 133/69  Pulse 49  Temp(Src) 97.4 F (36.3 C) (Oral)  Resp 16  Ht 4' 11.06" (1.5 m)  Wt 43.409 kg (95 lb 11.2 oz)  BMI 19.29 kg/m2  SpO2 91%  Physical Exam: Elderly thin female in no acute distress Lungs:  Clear Cardiac:  Regular rhythm, normal S1 and S2, no S3 Abdomen:  Soft, nontender, no masses Extremities:  No edema present  Intake/Output from previous day: 06/15 0701 - 06/16 0700 In: 243 [P.O.:240; I.V.:3] Out: -   Weight Filed Weights   04/13/14 0155 04/15/14 0500 04/16/14 0500  Weight: 44.453 kg (98 lb) 44.453 kg (98 lb) 43.409 kg (95 lb 11.2 oz)    Lab Results: Basic Metabolic Panel:  Recent Labs  04/13/14 1510  NA 142  K 4.1  CL 102  CO2 27  GLUCOSE 135*  BUN 15  CREATININE 1.15*   CBC:  Cardiac Enzymes:  Recent Labs  04/13/14 1510 04/13/14 2220 04/14/14 0431  TROPONINI 2.28* 1.83* 1.58*    Telemetry: Sinus rhythm with periods of sinus bradycardia, rates as low as 40.  Assessment/Plan:  1. Non-ST elevation myocardial infarction with new T-wave changes laterally and abnormal troponins 2. Hypertension 3. Recent stroke  Recommendations:  She has been ambulatory in the hall today without recurrent chest pain. No change from metoprolol to carvedilol 3.125 mg twice daily because of bradycardia. We'll need to hold the carvedilol if her pulse is less than 55. Reduce lisinopril to 2.5 mg daily because of somewhat low blood pressure. Continue statins, aspirin, and have when necessary nitroglycerin. I  need to see back in my office in 2 weeks.  Kerry Hough  MD [facility]  04/16/2014, 8:42 AM

## 2014-04-16 NOTE — Progress Notes (Signed)
Occupational Therapy Treatment Patient Details Name: Jill Castaneda MRN: [id number] DOB: 01/13/1926 Today's Date: 04/16/2014    History of present illness Pt is visiting from [place] to help granddgtr with care of her dgtr while in P.T. school. May 9 pt had a left parietal CVA , prior right frontal and cerebellar CVA and pt with continued balance impairment, left eye ptosis, and vertical diplopia from CVA. Admitted 6/12 with chest pain and NSTEMI with family declining cath due to age   OT comments  Pt progressing this session and reports positive experience with glasses. Family reports pt woke up asking for glasses. Pt and family educated on UE task to help with strengthening and reviewed spot occlusion with glasses.    Follow Up Recommendations  Outpatient OT    Equipment Recommendations  None recommended by OT    Recommendations for Other Services      Precautions / Restrictions Precautions Precautions: Fall Precaution Comments: left eye ptosis, vertical diplopia, posterior right LOB       Mobility Bed Mobility               General bed mobility comments: in chair on arrival  Transfers Overall transfer level: Needs assistance   Transfers: Sit to/from Stand Sit to Stand: Min guard              Balance Overall balance assessment: Needs assistance         Standing balance support: Single extremity supported;During functional activity Standing balance-Leahy Scale: Fair                     ADL Overall ADL's : Needs assistance/impaired     Grooming: Minimal assistance;Standing                   Toilet Transfer: Minimal assistance;Ambulation           Functional mobility during ADLs: Minimal assistance General ADL Comments: Pt requesting to walk on arrival. Pt demonstrates a right drift with walking. pt requires hand held (A). pt needs cues to decr speed. Pt reports that to the MD that the CT machine made all the chest pain go away.  Pt required additional spot occlusion lateral to the vertical strip placed on 6.15.16. Pt now reports no diplopia with tracking objects. Pt provided visual exercises working Rt to left attempting to keep object in focus as one item. Pt demonstrates crocheting usnig glasses. pt reports this is much better. tp demonstrates overshooting during task.      Vision                 Additional Comments: LT eye ptosis, no medial tracking, vertical diplopia ( present since may 2015 CVA)   Perception     Praxis      Cognition   Behavior During Therapy: [facility] for tasks assessed/performed Overall Cognitive Status: Within Functional Limits for tasks assessed                       Extremity/Trunk Assessment               Exercises     Shoulder Instructions       General Comments      Pertinent Vitals/ Pain       VSS       HR 100 with mobility reports no pain  Home Living  Prior Functioning/Environment              Frequency Min 2X/week     Progress Toward Goals  OT Goals(current goals can now be found in the care plan section)  Progress towards OT goals: Progressing toward goals  Acute Rehab OT Goals Patient Stated Goal: to be independent without double vision OT Goal Formulation: With patient/family Time For Goal Achievement: 04/29/14 Potential to Achieve Goals: Good ADL Goals Pt Will Transfer to Toilet: with min assist;bedside commode Pt/caregiver will Perform Home Exercise Program: With written HEP provided Additional ADL Goal #1: Pt will tolerate spot occlusion glasses for 2 hours.  Plan Discharge plan remains appropriate    Co-evaluation                 End of Session     Activity Tolerance Patient tolerated treatment well   Patient Left in chair;with call bell/phone within reach;with family/visitor present   Nurse Communication Mobility status;Precautions        Time:  6222-9798 OT Time Calculation (min): 31 min  Charges: OT General Charges $OT Visit: 1 Procedure OT Treatments $Therapeutic Activity: 23-37 mins  Peri Maris 04/16/2014, 9:57 AM Pager: [phone]

## 2014-04-16 NOTE — Progress Notes (Addendum)
Late Corrective Entry for Initial Evaluation 03/09/14   03/09/14 1622  Modified Rankin (Stroke Patients Only)  Pre-Morbid Rankin Score 0  Modified Rankin 4   This a corrective change due to the fact that this therapist mistakenly designated this patients Modified Rankin score as a severe disability instead of a moderately severe level of disability. 04/16/2014  Donnella Sham, [place] [phone]  (pager)

## 2014-04-27 ENCOUNTER — Ambulatory Visit (INDEPENDENT_AMBULATORY_CARE_PROVIDER_SITE_OTHER): Payer: Self-pay | Admitting: Family Medicine

## 2014-04-27 VITALS — BP 114/74 | HR 81 | Temp 98.2°F | Resp 16 | Ht <= 58 in | Wt 94.5 lb

## 2014-04-27 DIAGNOSIS — R1013 Epigastric pain: Secondary | ICD-10-CM

## 2014-04-27 MED ORDER — SUCRALFATE 1 GM/10ML PO SUSP: 1.0000 g | Freq: Three times a day (TID) | ORAL | Status: DC

## 2014-04-27 NOTE — Progress Notes (Signed)
The chart was scribed for Robyn Haber, MD, by Neta Ehlers, ED Scribe. This patient's care was started at 1:50 PM.   Patient ID: Jill Castaneda MRN: [id number], DOB: [birth year], 80 y.o. Date of Encounter: 04/27/2014, 1:46 PM  Primary Physician: No primary provider on file.  Chief Complaint:  Chief Complaint  Patient presents with   Fall   Discuss Medications     HPI: 80 y.o. year old female with history below presents requesting advice re recently prescribed medications.   The pt had an MI last week and has been prescribed new medications including Lipitor, Lisinopril, and Plavix.   She has experienced constant abdominal pain, decreased appetite and weight loss. The abdominal pain is temporarily improved postprandial.   Jill Castaneda also complains of a recent fall which occurred four days ago when she lost her balance and fell upon her side. She is experiencing gradually-worsening pain to her right knee which is increased with ambulation and flexion. The pt has used ice and an ace wrap to address the pain. She also complains of pain to her left hand.   The pt does not have health insurance.   Past Medical History  Diagnosis Date   Hypertension    Osteoporosis    Stroke    Cataract      Home Meds: Prior to Admission medications   Medication Sig Start Date End Date Taking? Authorizing Provider  aspirin EC 81 MG tablet Take 1 tablet (81 mg total) by mouth daily. 03/09/14  Yes Thurnell Lose, MD  atorvastatin (LIPITOR) 40 MG tablet Take 1 tablet (40 mg total) by mouth daily at 6 PM. 04/16/14  Yes Adeline C Viyuoh, MD  Calcium Carbonate-Vitamin D (CALCIUM + D PO) Take 1 tablet by mouth daily.   Yes Historical Provider, MD  clopidogrel (PLAVIX) 75 MG tablet Take 1 tablet (75 mg total) by mouth daily with breakfast. 04/16/14  Yes Adeline C Viyuoh, MD  lisinopril (PRINIVIL,ZESTRIL) 2.5 MG tablet Take 1 tablet (2.5 mg total) by mouth daily. 04/16/14  Yes Adeline Saralyn Pilar, MD    Multiple Vitamin (MULTIVITAMIN WITH MINERALS) TABS tablet Take 1 tablet by mouth every 3 (three) days.   Yes Historical Provider, MD    Allergies: No Known Allergies  History   Social History   Marital Status: Married    Spouse Name: N/A    Number of Children: 1   Years of Education: masters   Occupational History   retired    Social History Main Topics   Smoking status: Never Smoker    Smokeless tobacco: Never Used   Alcohol Use: No   Drug Use: No   Sexual Activity: No   Other Topics Concern   Not on file   Social History Narrative   Patient lives with granddaughter, patient drinks caffeine daily.     Review of Systems: Constitutional: positive decreased appetite and weight loss; negative for chills, fever, night sweats, or fatigue  HEENT: negative for vision changes, hearing loss, congestion, rhinorrhea, ST, epistaxis, or sinus pressure Cardiovascular: negative for chest pain or palpitations Respiratory: negative for hemoptysis, wheezing, shortness of breath, or cough Abdominal: positive abdominal pain; negative for nausea, vomiting, diarrhea, or constipation Dermatological: negative for rash Neurologic: negative for headache, dizziness, or syncope All other systems reviewed and are otherwise negative with the exception to those above and in the HPI.   Physical Exam: Triage Vitals: Blood pressure 114/74, pulse 81, temperature 98.2 F (36.8 C), temperature source Oral, resp. rate 16,  height 4\' 8"  (1.422 m), weight 94 lb 8 oz (42.865 kg), SpO2 94.00%., Body mass index is 21.2 kg/(m^2).  General: Well developed, well nourished, in no acute distress. Head: Normocephalic, atraumatic, eyes without discharge, sclera non-icteric, nares are without discharge. Bilateral auditory canals clear, TM's are without perforation, pearly grey and translucent with reflective cone of light bilaterally. Oral cavity moist, posterior pharynx without exudate, erythema, peritonsillar  abscess, or post nasal drip.  Neck: Supple. No thyromegaly. Full ROM. No lymphadenopathy. Lungs: Clear bilaterally to auscultation without wheezes, rales, or rhonchi. Breathing is unlabored. Heart: RRR with S1 S2. No murmurs, rubs, or gallops appreciated. Abdomen: Soft, non-tender, non-distended with normoactive bowel sounds. No hepatomegaly. No rebound/guarding. No obvious abdominal masses. Msk:  Strength and tone normal for age. Extremities/Skin: Warm and dry. No clubbing or cyanosis. No edema. No rashes or suspicious lesions. Neuro: Alert and oriented X 3. Moves all extremities spontaneously. Gait is normal. CNII-XII grossly in tact. Psych:  Responds to questions appropriately with a normal affect.   Results for orders placed during the hospital encounter of 04/12/14  CBC      Result Value Ref Range   WBC 8.4  4.0 - 10.5 K/uL   RBC 4.52  3.87 - 5.11 MIL/uL   Hemoglobin 13.2  12.0 - 15.0 g/dL   HCT 40.6  36.0 - 46.0 %   MCV 89.8  78.0 - 100.0 fL   MCH 29.2  26.0 - 34.0 pg   MCHC 32.5  30.0 - 36.0 g/dL   RDW 14.9  11.5 - 15.5 %   Platelets 421 (*) 150 - 400 K/uL  BASIC METABOLIC PANEL      Result Value Ref Range   Sodium 141  137 - 147 mEq/L   Potassium 4.5  3.7 - 5.3 mEq/L   Chloride 103  96 - 112 mEq/L   CO2 25  19 - 32 mEq/L   Glucose, Bld 106 (*) 70 - 99 mg/dL   BUN 16  6 - 23 mg/dL   Creatinine, Ser 1.10  0.50 - 1.10 mg/dL   Calcium 9.2  8.4 - 10.5 mg/dL   GFR calc non Af Amer 45 (*) >90 mL/min   GFR calc Af Amer 52 (*) >90 mL/min  BASIC METABOLIC PANEL      Result Value Ref Range   Sodium 141  137 - 147 mEq/L   Potassium 4.1  3.7 - 5.3 mEq/L   Chloride 103  96 - 112 mEq/L   CO2 26  19 - 32 mEq/L   Glucose, Bld 89  70 - 99 mg/dL   BUN 13  6 - 23 mg/dL   Creatinine, Ser 1.02  0.50 - 1.10 mg/dL   Calcium 8.8  8.4 - 10.5 mg/dL   GFR calc non Af Amer 49 (*) >90 mL/min   GFR calc Af Amer 57 (*) >90 mL/min  CBC      Result Value Ref Range   WBC 6.6  4.0 - 10.5 K/uL    RBC 4.14  3.87 - 5.11 MIL/uL   Hemoglobin 12.1  12.0 - 15.0 g/dL   HCT 37.1  36.0 - 46.0 %   MCV 89.6  78.0 - 100.0 fL   MCH 29.2  26.0 - 34.0 pg   MCHC 32.6  30.0 - 36.0 g/dL   RDW 14.7  11.5 - 15.5 %   Platelets 401 (*) 150 - 400 K/uL  TROPONIN I      Result Value Ref Range  Troponin I 1.65 (*) <0.30 ng/mL  TROPONIN I      Result Value Ref Range   Troponin I 2.28 (*) <0.30 ng/mL  TROPONIN I      Result Value Ref Range   Troponin I 1.58 (*) <0.30 ng/mL  COMPREHENSIVE METABOLIC PANEL      Result Value Ref Range   Sodium 142  137 - 147 mEq/L   Potassium 4.1  3.7 - 5.3 mEq/L   Chloride 102  96 - 112 mEq/L   CO2 27  19 - 32 mEq/L   Glucose, Bld 135 (*) 70 - 99 mg/dL   BUN 15  6 - 23 mg/dL   Creatinine, Ser 1.15 (*) 0.50 - 1.10 mg/dL   Calcium 9.4  8.4 - 10.5 mg/dL   Total Protein 6.7  6.0 - 8.3 g/dL   Albumin 3.2 (*) 3.5 - 5.2 g/dL   AST 36  0 - 37 U/L   ALT 11  0 - 35 U/L   Alkaline Phosphatase 52  39 - 117 U/L   Total Bilirubin 1.1  0.3 - 1.2 mg/dL   GFR calc non Af Amer 42 (*) >90 mL/min   GFR calc Af Amer 49 (*) >90 mL/min  TROPONIN I      Result Value Ref Range   Troponin I 1.83 (*) <0.30 ng/mL  I-STAT TROPOININ, ED      Result Value Ref Range   Troponin i, poc 0.02  0.00 - 0.08 ng/mL   Comment 3             ASSESSMENT AND PLAN:  80 y.o. year old female with recent mild mi and epigastric pain primarily after taking meds   For the next week, just use the sucralfate three to four times daily  Then, week by week, try adding back the medicines to prevent heart injury First, lisinopril Second, Plavix 1/2 tab daily Third, Lipitro twice weekly   Signed, Robyn Haber, MD 04/27/2014 1:46 PM

## 2014-04-27 NOTE — Patient Instructions (Signed)
For the next week, just use the sucralfate three to four times daily  Then, week by week, try adding back the medicines to prevent heart injury First, lisinopril Second, Plavix 1/2 tab daily Third, Lipitro twice weekly

## 2014-07-03 ENCOUNTER — Ambulatory Visit: Payer: Self-pay | Admitting: Nurse Practitioner

## 2014-08-18 ENCOUNTER — Emergency Department (HOSPITAL_COMMUNITY): Payer: Self-pay

## 2014-08-18 ENCOUNTER — Emergency Department (HOSPITAL_COMMUNITY)
Admission: EM | Admit: 2014-08-18 | Discharge: 2014-08-18 | Disposition: A | Discharge status: Home / Self Care | Payer: Self-pay | Attending: Emergency Medicine | Admitting: Emergency Medicine

## 2014-08-18 ENCOUNTER — Encounter (HOSPITAL_COMMUNITY): Payer: Self-pay | Admitting: Emergency Medicine

## 2014-08-18 DIAGNOSIS — H532 Diplopia: Secondary | ICD-10-CM | POA: Insufficient documentation

## 2014-08-18 DIAGNOSIS — Z7982 Long term (current) use of aspirin: Secondary | ICD-10-CM | POA: Insufficient documentation

## 2014-08-18 DIAGNOSIS — I10 Essential (primary) hypertension: Secondary | ICD-10-CM | POA: Insufficient documentation

## 2014-08-18 DIAGNOSIS — Z8673 Personal history of transient ischemic attack (TIA), and cerebral infarction without residual deficits: Secondary | ICD-10-CM | POA: Insufficient documentation

## 2014-08-18 DIAGNOSIS — Z79899 Other long term (current) drug therapy: Secondary | ICD-10-CM | POA: Insufficient documentation

## 2014-08-18 DIAGNOSIS — Z7902 Long term (current) use of antithrombotics/antiplatelets: Secondary | ICD-10-CM | POA: Insufficient documentation

## 2014-08-18 DIAGNOSIS — M810 Age-related osteoporosis without current pathological fracture: Secondary | ICD-10-CM | POA: Insufficient documentation

## 2014-08-18 LAB — CBC
HCT: 41.4 % (ref 36.0–46.0)
Hemoglobin: 13.3 g/dL (ref 12.0–15.0)
MCH: 28.9 pg (ref 26.0–34.0)
MCHC: 32.1 g/dL (ref 30.0–36.0)
MCV: 89.8 fL (ref 78.0–100.0)
PLATELETS: 499 10*3/uL — AB (ref 150–400)
RBC: 4.61 MIL/uL (ref 3.87–5.11)
RDW: 13.8 % (ref 11.5–15.5)
WBC: 5.4 10*3/uL (ref 4.0–10.5)

## 2014-08-18 LAB — I-STAT CHEM 8, ED
BUN: 19 mg/dL (ref 6–23)
CALCIUM ION: 1.17 mmol/L (ref 1.13–1.30)
CHLORIDE: 105 meq/L (ref 96–112)
Creatinine, Ser: 1 mg/dL (ref 0.50–1.10)
Glucose, Bld: 176 mg/dL — ABNORMAL HIGH (ref 70–99)
HEMATOCRIT: 44 % (ref 36.0–46.0)
Hemoglobin: 15 g/dL (ref 12.0–15.0)
Potassium: 4.1 mEq/L (ref 3.7–5.3)
SODIUM: 140 meq/L (ref 137–147)
TCO2: 24 mmol/L (ref 0–100)

## 2014-08-18 LAB — COMPREHENSIVE METABOLIC PANEL
ALBUMIN: 3.6 g/dL (ref 3.5–5.2)
ALT: 9 U/L (ref 0–35)
AST: 25 U/L (ref 0–37)
Alkaline Phosphatase: 47 U/L (ref 39–117)
Anion gap: 12 (ref 5–15)
BUN: 19 mg/dL (ref 6–23)
CALCIUM: 9.3 mg/dL (ref 8.4–10.5)
CO2: 24 mEq/L (ref 19–32)
Chloride: 102 mEq/L (ref 96–112)
Creatinine, Ser: 1 mg/dL (ref 0.50–1.10)
GFR calc non Af Amer: 50 mL/min — ABNORMAL LOW (ref >90)
GFR, EST AFRICAN AMERICAN: 58 mL/min — AB (ref >90)
Glucose, Bld: 174 mg/dL — ABNORMAL HIGH (ref 70–99)
Potassium: 4.4 mEq/L (ref 3.7–5.3)
SODIUM: 138 meq/L (ref 137–147)
TOTAL PROTEIN: 7 g/dL (ref 6.0–8.3)
Total Bilirubin: 0.6 mg/dL (ref 0.3–1.2)

## 2014-08-18 LAB — PROTIME-INR
INR: 1.07 (ref 0.00–1.49)
PROTHROMBIN TIME: 14.1 s (ref 11.6–15.2)

## 2014-08-18 LAB — DIFFERENTIAL
BASOS ABS: 0.1 10*3/uL (ref 0.0–0.1)
Basophils Relative: 1 % (ref 0–1)
EOS ABS: 0.1 10*3/uL (ref 0.0–0.7)
Eosinophils Relative: 2 % (ref 0–5)
Lymphocytes Relative: 21 % (ref 12–46)
Lymphs Abs: 1.1 10*3/uL (ref 0.7–4.0)
Monocytes Absolute: 0.5 10*3/uL (ref 0.1–1.0)
Monocytes Relative: 9 % (ref 3–12)
NEUTROS PCT: 67 % (ref 43–77)
Neutro Abs: 3.7 10*3/uL (ref 1.7–7.7)

## 2014-08-18 LAB — I-STAT TROPONIN, ED: Troponin i, poc: 0.02 ng/mL (ref 0.00–0.08)

## 2014-08-18 LAB — CBG MONITORING, ED: GLUCOSE-CAPILLARY: 126 mg/dL — AB (ref 70–99)

## 2014-08-18 LAB — APTT: aPTT: 39 seconds — ABNORMAL HIGH (ref 24–37)

## 2014-08-18 NOTE — ED Notes (Signed)
Per pt and family member. Pt reports visual change in her right eye at 1730. Family noticed right eye droop at [place]. Pt has hx of stroke in may and MI in June. Pt has weakness on right side from previous stroke. Family states her right eye is drooping more than usual and her smile is droops more than normal. Pt does not take blood thinners. Pt has good grips with mild weakness in right side. Family and pt deny and speech changes.

## 2014-08-18 NOTE — Consult Note (Signed)
Consult Reason for Consult: worsening of double vision Referring Physician: Dr Wyvonnia Dusky  CC: "My double vision is worse"  HPI: Jill Castaneda is an 80 y.o. female with history of prior stroke resulting in double vision and right sided weakness that has progressively improved presenting as a code stroke for marked worsening of her double vision. Notes symptoms are not as bad as prior stroke but close. Denies any worsening of her weakness. No sensory changes. No difficulty walking. Takes ASA 81mg  daily. Has history of HTN  Patient does not speak English so history via Optometrist.   Past Medical History  Diagnosis Date  . Hypertension   . Osteoporosis   . Stroke   . Cataract     Past Surgical History  Procedure Laterality Date  . Total hip arthroplasty Right   . Shoulder hemi-arthroplasty Left   . Shoulder hardware removal Left   . Fracture surgery      Family History  Problem Relation Age of Onset  . Hypertension Father   . Heart disease Father   . Hypertension Sister   . Heart disease Sister   . Stroke Mother   . Cancer Brother     Social History:  reports that she has never smoked. She has never used smokeless tobacco. She reports that she does not drink alcohol or use illicit drugs.  No Known Allergies   ROS: Out of a complete 14 system review, the patient complains of only the following symptoms, and all other reviewed systems are negative. +double vision  Physical Examination: Blood pressure 157/84, pulse 54, temperature 98.5 F (36.9 C), temperature source Oral, resp. rate 20, height 4\' 10"  (1.473 m), weight 44.906 kg (99 lb), SpO2 95.00%.  Neurologic Examination Mental Status: Via translator Alert, oriented, thought content appropriate.  Speech fluent without evidence of aphasia.  Able to follow 3 step commands without difficulty. Cranial Nerves: II: unable to visualize fundi due to pupil size, visual fields grossly normal, pupils equal, round, reactive to  light and accommodation III,IV, VI: ptosis not present, unable to adduct left eye past midline. Double vision only noted with lateral gaze to the right V,VII: smile symmetric, facial light touch sensation normal bilaterally VIII: hearing normal bilaterally IX,X: gag reflex present XI: trapezius strength/neck flexion strength normal bilaterally XII: tongue strength normal  Motor: Right : Upper extremity    Left:     Upper extremity 5-/5 deltoid       5/5 deltoid 5-/5 biceps      5/5 biceps  5/5 triceps      5/5 triceps 5-/5 hand grip      5/5 hand grip  Lower extremity     Lower extremity 5/5 hip flexor      5/5 hip flexor 5/5 quadricep      5/5 quadriceps  5/5 hamstrings     5/5 hamstrings 5/5 plantar flexion       5/5 plantar flexion 5/5 plantar extension     5/5 plantar extension Tone and bulk:normal tone throughout; no atrophy noted Sensory: Pinprick and light touch intact throughout, bilaterally Deep Tendon Reflexes: 2+ and symmetric throughout Plantars: Right: downgoing   Left: downgoing Cerebellar: normal finger-to-nose Gait: deferred due to multiple leads  Laboratory Studies:   Basic Metabolic Panel:  Recent Labs Lab 08/18/14 2110 08/18/14 2120  NA 138 140  K 4.4 4.1  CL 102 105  CO2 24  --   GLUCOSE 174* 176*  BUN 19 19  CREATININE 1.00 1.00  CALCIUM 9.3  --  Liver Function Tests:  Recent Labs Lab 08/18/14 2110  AST 25  ALT 9  ALKPHOS 47  BILITOT 0.6  PROT 7.0  ALBUMIN 3.6   No results found for this basename: LIPASE, AMYLASE,  in the last 168 hours No results found for this basename: AMMONIA,  in the last 168 hours  CBC:  Recent Labs Lab 08/18/14 2110 08/18/14 2120  WBC 5.4  --   NEUTROABS 3.7  --   HGB 13.3 15.0  HCT 41.4 44.0  MCV 89.8  --   PLT 499*  --     Cardiac Enzymes: No results found for this basename: CKTOTAL, CKMB, CKMBINDEX, TROPONINI,  in the last 168 hours  BNP: No components found with this basename: POCBNP,    CBG:  Recent Labs Lab 08/18/14 2138  GLUCAP 126*    Microbiology: No results found for this or any previous visit.  Coagulation Studies:  Recent Labs  08/18/14 2110  LABPROT 14.1  INR 1.07    Urinalysis: No results found for this basename: COLORURINE, APPERANCEUR, LABSPEC, PHURINE, GLUCOSEU, HGBUR, BILIRUBINUR, KETONESUR, PROTEINUR, UROBILINOGEN, NITRITE, LEUKOCYTESUR,  in the last 168 hours  Lipid Panel:     Component Value Date/Time   CHOL 160 03/09/2014 0422   TRIG 70 03/09/2014 0422   HDL 58 03/09/2014 0422   CHOLHDL 2.8 03/09/2014 0422   VLDL 14 03/09/2014 0422   LDLCALC 88 03/09/2014 0422    HgbA1C:  Lab Results  Component Value Date   HGBA1C 6.4* 03/09/2014    Urine Drug Screen:   No results found for this basename: labopia, cocainscrnur, labbenz, amphetmu, thcu, labbarb    Alcohol Level: No results found for this basename: ETH,  in the last 168 hours  Imaging: Ct Head (brain) Wo Contrast  08/18/2014   ADDENDUM REPORT: 08/18/2014 21:34  ADDENDUM: Study discussed by telephone with Dr. Mamie Nick Summer on 08/18/2014 at 2132 hrs.   Electronically Signed   By: Lars Pinks M.D.   On: 08/18/2014 21:34   08/18/2014   CLINICAL DATA:  80 year old female with acute right facial droop and double vision. Code stroke. Initial encounter.  EXAM: CT HEAD WITHOUT CONTRAST  TECHNIQUE: Contiguous axial images were obtained from the base of the skull through the vertex without intravenous contrast.  COMPARISON:  Brain MRI 03/09/2014 and earlier.  FINDINGS: No acute osseous abnormality identified. Visualized orbits and scalp soft tissues are within normal limits. Visualized paranasal sinuses and mastoids are clear. Calcified atherosclerosis at the skull base.  Hypodense encephalomalacia has developed along the course of the stroke that occurred in May a from the medial left thalamus to the left midbrain. Chronic small left parietal lobe cortical and subcortical encephalomalacia is stable. No evidence  of cortically based acute infarction identified. Frontal horn white matter hypodensity is stable. Chronic right superior cerebellar infarct is stable. No suspicious intracranial vascular hyperdensity. No acute intracranial hemorrhage identified. No ventriculomegaly.  IMPRESSION: 1. Expected evolution of the left thalamic and midbrain stroke which occurred in May. Underlying chronic median size vessel ischemia. 2. No acute cortically based infarct identified. No acute intracranial hemorrhage.  Electronically Signed: By: Lars Pinks M.D. On: 08/18/2014 21:29     Assessment/Plan:  80y/o woman with history of prior CVA with baseline diplopia and mild RUE weakness presenting for acute worsening of her double vision. Patient was a code stroke but tPA not given due to mildness of her symptoms. Exam shows impaired adduction of left eye with inability to move past midline. PERRL.  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 Plan to admit for MRI to rule out new infarct but family wishes to complete workup as outpatient.   -increase ASA to 325mg  daily -will need MRI and MRA as outpatient -follow up with [facility]  -strict return instructions if symptoms worsen or develops new symptoms   Jim Like, DO [facility] [phone]  If 7pm- 7am, please page neurology on call as listed in [place]. 08/18/2014, 11:19 PM

## 2014-08-18 NOTE — ED Provider Notes (Signed)
CSN: [id number]     Arrival date & time 08/18/14  2026 History   First MD Initiated Contact with Patient 08/18/14 2140     Chief Complaint  Patient presents with  . Code Stroke     (Consider location/radiation/quality/duration/timing/severity/associated sxs/prior Treatment) HPI Comments: Patient is 80 year old female who comes in as a code stroke. Patient has a history of previous stroke resulting in right eye diplopia a as well as as well as right-sided weakness has gradually improved. She complains of worsening double vision starting 3 hours ago. She denies any problems with her extremities specifically no numbness or weakness. She does have a right eye droop. No head trauma. Denies any others symptoms including speech changes. No change in mental status. No history of diabetes  Level V caveat due to acuity  The history is provided by the patient and a relative. A language interpreter was used (pt wishes for family member to translate).    Past Medical History  Diagnosis Date  . Hypertension   . Osteoporosis   . Stroke   . Cataract    Past Surgical History  Procedure Laterality Date  . Total hip arthroplasty Right   . Shoulder hemi-arthroplasty Left   . Shoulder hardware removal Left   . Fracture surgery     Family History  Problem Relation Age of Onset  . Hypertension Father   . Heart disease Father   . Hypertension Sister   . Heart disease Sister   . Stroke Mother   . Cancer Brother    History  Substance Use Topics  . Smoking status: Never Smoker   . Smokeless tobacco: Never Used  . Alcohol Use: No   OB History   Grav Para Term Preterm Abortions TAB SAB Ect Mult Living                 Review of Systems  Unable to perform ROS: Acuity of condition      Allergies  Review of patient's allergies indicates no known allergies.  Home Medications   Prior to Admission medications   Medication Sig Start Date End Date Taking? Authorizing Provider  aspirin EC  81 MG tablet Take 1 tablet (81 mg total) by mouth daily. 03/09/14   Thurnell Lose, MD  atorvastatin (LIPITOR) 40 MG tablet Take 1 tablet (40 mg total) by mouth daily at 6 PM. 04/16/14   Sheila Oats, MD  Calcium Carbonate-Vitamin D (CALCIUM + D PO) Take 1 tablet by mouth daily.    Historical Provider, MD  clopidogrel (PLAVIX) 75 MG tablet Take 1 tablet (75 mg total) by mouth daily with breakfast. 04/16/14   Sheila Oats, MD  lisinopril (PRINIVIL,ZESTRIL) 2.5 MG tablet Take 1 tablet (2.5 mg total) by mouth daily. 04/16/14   Sheila Oats, MD  Multiple Vitamin (MULTIVITAMIN WITH MINERALS) TABS tablet Take 1 tablet by mouth every 3 (three) days.    Historical Provider, MD  sucralfate (CARAFATE) 1 GM/10ML suspension Take 10 mLs (1 g total) by mouth 4 (four) times daily -  with meals and at bedtime. 04/27/14   Robyn Haber, MD   BP 157/85  Pulse 52  Temp(Src) 98.2 F (36.8 C) (Oral)  Resp 12  Ht 4\' 10"  (1.473 m)  Wt 99 lb (44.906 kg)  BMI 20.70 kg/m2  SpO2 97% Physical Exam  Nursing note and vitals reviewed. Constitutional: She is oriented to person, place, and time. She appears well-developed and well-nourished. No distress.  Patient appears comfortable in no  acute distress  HENT:  Head: Normocephalic and atraumatic.  Mouth/Throat: Oropharynx is clear and moist. No oropharyngeal exudate.  No signs of head trauma on exam  Eyes: Conjunctivae and EOM are normal. Pupils are equal, round, and reactive to light. Right eye exhibits no discharge. Left eye exhibits no discharge. No scleral icterus.  Left eye with difficulty adduction Right eye with full EOMs intact   Neck: Normal range of motion. Neck supple.  Cardiovascular: Normal rate, regular rhythm and normal heart sounds.   No murmur heard. Pulmonary/Chest: Effort normal and breath sounds normal. No respiratory distress. She has no wheezes. She has no rales.  Abdominal: Soft. She exhibits no distension and no mass. There is no  tenderness.  Neurological: She is alert and oriented to person, place, and time. She exhibits normal muscle tone. Coordination normal.  5/5 strength in all extremities Normal sensation in all extremities No ataxia AO times3 Diminished forehead furrowing on right but can smile fully  Skin: Skin is warm. No rash noted. She is not diaphoretic.    ED Course  Procedures (including critical care time) Labs Review Labs Reviewed  APTT - Abnormal; Notable for the following:    aPTT 39 (*)    All other components within normal limits  CBC - Abnormal; Notable for the following:    Platelets 499 (*)    All other components within normal limits  COMPREHENSIVE METABOLIC PANEL - Abnormal; Notable for the following:    Glucose, Bld 174 (*)    GFR calc non Af Amer 50 (*)    GFR calc Af Amer 58 (*)    All other components within normal limits  CBG MONITORING, ED - Abnormal; Notable for the following:    Glucose-Capillary 126 (*)    All other components within normal limits  I-STAT CHEM 8, ED - Abnormal; Notable for the following:    Glucose, Bld 176 (*)    All other components within normal limits  PROTIME-INR  DIFFERENTIAL  I-STAT TROPOININ, ED    Imaging Review Ct Head (brain) Wo Contrast  08/18/2014   ADDENDUM REPORT: 08/18/2014 21:34  ADDENDUM: Study discussed by telephone with Dr. Mamie Nick Summer on 08/18/2014 at 2132 hrs.   Electronically Signed   By: Lars Pinks M.D.   On: 08/18/2014 21:34   08/18/2014   CLINICAL DATA:  80 year old female with acute right facial droop and double vision. Code stroke. Initial encounter.  EXAM: CT HEAD WITHOUT CONTRAST  TECHNIQUE: Contiguous axial images were obtained from the base of the skull through the vertex without intravenous contrast.  COMPARISON:  Brain MRI 03/09/2014 and earlier.  FINDINGS: No acute osseous abnormality identified. Visualized orbits and scalp soft tissues are within normal limits. Visualized paranasal sinuses and mastoids are clear.  Calcified atherosclerosis at the skull base.  Hypodense encephalomalacia has developed along the course of the stroke that occurred in May a from the medial left thalamus to the left midbrain. Chronic small left parietal lobe cortical and subcortical encephalomalacia is stable. No evidence of cortically based acute infarction identified. Frontal horn white matter hypodensity is stable. Chronic right superior cerebellar infarct is stable. No suspicious intracranial vascular hyperdensity. No acute intracranial hemorrhage identified. No ventriculomegaly.  IMPRESSION: 1. Expected evolution of the left thalamic and midbrain stroke which occurred in May. Underlying chronic median size vessel ischemia. 2. No acute cortically based infarct identified. No acute intracranial hemorrhage.  Electronically Signed: By: Lars Pinks M.D. On: 08/18/2014 21:29     EKG Interpretation  Date/Time:  Sunday August 18 2014 21:32:00 EDT Ventricular Rate:  60 PR Interval:  200 QRS Duration: 101 QT Interval:  441 QTC Calculation: 441 R Axis:   -49 Text Interpretation:  Sinus rhythm Inferior infarct, old Baseline wander  in lead(s) V4 t waves in V4-V6 no longer inverted Confirmed by HARRISON   MD, FORREST ([id number]) on 08/18/2014 9:52:58 PM      MDM   MDM: 80 year old female presents code stroke due to right sided double vision. 3 hours ago   symptoms started. Normal blood sugar. Neuro at bedside on arrival. CT scan obtained showing no bleed. Patient's neuro exam as Above. Very mild symptoms so not given TPA per neurology. Neuro recommends admission for MRI and workup. Family wishes to pursue workup as outpatient. Neuro discussed at bedside with patient risks and benefits but they still wish to pursue outpatient. Patient has a neurologist at [facility] they will followup with.recommend increasing aspirin to 325 daily. Return to ED as needed for change in symptoms or worsening symptoms. Discharged   Final diagnoses:  Diplopia     Discharge Medication List as of 08/18/2014 11:28 PM     [place] [address] [id number] [place] [address] [phone]  As needed  Neurology will schedule you outpatient follow up  Schedule an appointment as soon as possible for a visit   Discharged   Sol Passer, MD 08/19/14 0136

## 2014-08-18 NOTE — ED Notes (Signed)
Pt reports double vision onset at 1730. Reports similar symptoms to previous stroke

## 2014-08-18 NOTE — Discharge Instructions (Signed)
Diplopia  °Double vision (diplopia) means that you are seeing two of everything. Diplopia usually occurs with both eyes open, and may be worse when looking in one particular direction. If both eyes must be open to see double, this is called binocular diplopia. If double images are seen in just one eye, this is called monocular diplopia.  °CAUSES  °Binocular Diplopia °· Disorder affecting the muscles that move the eyes or the nerves that control those muscles. °· Tumor or other mass pushing on an eye from beside or behind the eye. °· Myasthenia gravis. This is a neuromuscular illness that causes the body's muscles to tire easily. The eye muscles and eyelid muscles become weak. The eyes do not track together well. °· Grave's disease. This is an overactivity of the thyroid gland. This condition causes swelling of tissues around the eyes. This produces a bulging out of the eyeball. °· Blowout fracture of the bone around the eye. The muscles of the eye socket are damaged. This often happens when the eye is hit with force. °· Complications after certain surgeries, such as a lens implant after cataract surgery. °· Fluid-filled mass (abscess) behind or beside the eye, infection, or abnormal connection between arteries and veins. °Sometimes, no cause is found. °Monocular Diplopia °· Problems with corrective lens or contacts. °· Corneal abrasion, infection, severe injury, or bulging and irregularity of the corneal surface (keratoconus). °· Irregularities of the pupil from drugs, severe injury, or other causes. °· Problems involving the lens of the eye, such as opacities or cataracts. °· Complications after certain surgeries, such as a lens implant after cataract surgery. °· Retinal detachment or problems involving the blood vessels of the retina. °Sometimes, no cause is found. °SYMPTOMS  °Binocular Diplopia °· When one eye is closed or covered, the double images disappear. °Monocular Diplopia °· When the unaffected eye is  closed or covered, the double images remain. The double images disappear when the affected eye is closed or covered. °DIAGNOSIS  °A diagnosis is made during an eye exam. °TREATMENT  °Treatment depends on the cause or underlying disease.  °· Relief of double vision symptoms may be achieved by patching one eye or by using special glasses. °· Surgery on the muscles of the eye may be needed. °SEEK IMMEDIATE MEDICAL CARE IF:  °· You see two images of a single object you are looking at, either with both eyes open or with just one eye open. °Document Released: 08/19/2004 Document Revised: 01/10/2012 Document Reviewed: 06/05/2008 °ExitCare® Patient Information ©2015 [employer]. This information is not intended to replace advice given to you by your health care provider. Make sure you discuss any questions you have with your health care provider. ° °

## 2014-08-19 NOTE — ED Provider Notes (Signed)
Medical screening examination/treatment/procedure(s) were conducted as a shared visit with resident physician and myself.  I personally evaluated the patient during the encounter.    EKG Interpretation  Date/Time:  Sunday August 18 2014 21:32:00 EDT Ventricular Rate:  60 PR Interval:  200 QRS Duration: 101 QT Interval:  441 QTC Calculation: 441 R Axis:   -49 Text Interpretation:  Sinus rhythm Inferior infarct, old Baseline wander in lead(s) V4 t waves in V4-V6 no longer inverted Confirmed by Kawana Hegel  MD, Jameyah Fennewald ([id number]) on 08/18/2014 9:52:58 PM      I interviewed and examined the patient. Lungs are CTAB. Cardiac exam wnl. Abdomen soft.  Family would like to go home despite neuro recs. Risks discussed w/ family/patient. Return precautions provided.   Pamella Pert, MD 08/19/14 2038

## 2016-01-07 ENCOUNTER — Telehealth: Payer: Self-pay

## 2016-01-07 NOTE — Telephone Encounter (Signed)
Rn call [employer]. Rn ask to speak with [facility] Freda Munro was not available. Rn explain pt was last seen by Dr.Sethi in June 2015. In Dr.Sehi last office visit she was only to follow up as needed. Rn stated Dr.Sethi did not prescribed the Plavix. Rn stated pt would need another office visit for neuro clearance. Rn receive the fax on 01/07/2016 and the paper stated the form was fax today.

## 2016-01-08 ENCOUNTER — Ambulatory Visit (INDEPENDENT_AMBULATORY_CARE_PROVIDER_SITE_OTHER): Payer: Self-pay | Admitting: Family Medicine

## 2016-01-08 VITALS — BP 112/70 | HR 59 | Temp 97.7°F | Resp 18 | Wt 96.2 lb

## 2016-01-08 DIAGNOSIS — I10 Essential (primary) hypertension: Secondary | ICD-10-CM

## 2016-01-08 MED ORDER — AMLODIPINE BESYLATE 2.5 MG PO TABS: 2.5000 mg | ORAL_TABLET | Freq: Every day | ORAL | Status: DC

## 2016-01-08 NOTE — Progress Notes (Addendum)
Subjective:  This chart was scribed for Robyn Haber MD, by Tamsen Roers, at [facility].  This patient was seen in room 8 and the patient's care was started at 11:23 AM.   Chief Complaint  Patient presents with  . Needs Clearance for Cataract Surgery     Patient ID: Jill Castaneda, female    DOB: February 05, 1928, 82 y.o.   MRN: [id number]  HPI HPI Comments: Jill Castaneda is a 82 y.o. female who presents to the [facility] with her grand daughter for surgical clearance for her cataract surgery.  Patient is compliant with her amlodipine and aspirin. They would like a refill for her amlodipine today. She has no other complaints or concerns today.    Patient Active Problem List   Diagnosis Date Noted  . NSTEMI (non-ST elevated myocardial infarction) ([place]) 04/15/2014  . Chest pain 04/13/2014  . Non-STEMI (non-ST elevated myocardial infarction) ([place]) 04/13/2014  . Hypertension   . CVA (cerebral infarction) 03/08/2014  . Unspecified essential hypertension 03/08/2014   Past Medical History  Diagnosis Date  . Hypertension   . Osteoporosis   . Stroke ([place])   . Cataract    Past Surgical History  Procedure Laterality Date  . Total hip arthroplasty Right   . Shoulder hemi-arthroplasty Left   . Shoulder hardware removal Left   . Fracture surgery     No Known Allergies Prior to Admission medications   Medication Sig Start Date End Date Taking? Authorizing Provider  amLODipine (NORVASC) 5 MG tablet Take 5 mg by mouth daily.   Yes Historical Provider, MD  aspirin EC 81 MG tablet Take 1 tablet (81 mg total) by mouth daily. 03/09/14  Yes Thurnell Lose, MD  atorvastatin (LIPITOR) 40 MG tablet Take 1 tablet (40 mg total) by mouth daily at 6 PM. Patient not taking: Reported on 01/08/2016 04/16/14   Sheila Oats, MD  Calcium Carbonate-Vitamin D (CALCIUM + D PO) Take 1 tablet by mouth daily. Reported on 01/08/2016    Historical Provider, MD    clopidogrel (PLAVIX) 75 MG tablet Take 1 tablet (75 mg total) by mouth daily with breakfast. Patient not taking: Reported on 01/08/2016 04/16/14   Sheila Oats, MD  lisinopril (PRINIVIL,ZESTRIL) 2.5 MG tablet Take 1 tablet (2.5 mg total) by mouth daily. Patient not taking: Reported on 01/08/2016 04/16/14   Sheila Oats, MD  Multiple Vitamin (MULTIVITAMIN WITH MINERALS) TABS tablet Take 1 tablet by mouth every 3 (three) days. Reported on 01/08/2016    Historical Provider, MD  sucralfate (CARAFATE) 1 GM/10ML suspension Take 10 mLs (1 g total) by mouth 4 (four) times daily -  with meals and at bedtime. Patient not taking: Reported on 01/08/2016 04/27/14   Robyn Haber, MD   Social History   Social History  . Marital Status: Married    Spouse Name: N/A  . Number of Children: 1  . Years of Education: masters   Occupational History  . retired    Social History Main Topics  . Smoking status: Never Smoker   . Smokeless tobacco: Never Used  . Alcohol Use: No  . Drug Use: No  . Sexual Activity: No   Other Topics Concern  . Not on file   Social History Narrative   Patient lives with granddaughter, patient drinks caffeine daily.    Review of Systems  Constitutional: Negative for fever and chills.  Respiratory: Negative for cough, choking and shortness of breath.  Gastrointestinal: Negative for nausea and vomiting.  Musculoskeletal: Negative for neck pain and neck stiffness.  Neurological: Negative for syncope and speech difficulty.       Objective:   Physical Exam  Constitutional: She is oriented to person, place, and time. No distress.  HENT:  Head: Normocephalic and atraumatic.  Cardiovascular: Normal rate.   Pulmonary/Chest: Effort normal. No respiratory distress.  Musculoskeletal: Normal range of motion.  Neurological: She is alert and oriented to person, place, and time.  Skin: Skin is warm and dry.  Psychiatric: She has a normal mood and affect. Her behavior is  normal.  Nursing note and vitals reviewed. Very mild right hand tremor with only slight decrease in residual strength from past CVA  Grand.daughter translates for [place]  Filed Vitals:   01/08/16 1047  BP: 112/70  Pulse: 59  Temp: 97.7 F (36.5 C)  TempSrc: Oral  Resp: 18  Weight: 96 lb 3.2 oz (43.636 kg)  SpO2: 96%         Assessment & Plan:   This chart was scribed in my presence and reviewed by me personally.    ICD-9-CM ICD-10-CM   1. Essential hypertension 401.9 I10 amLODipine (NORVASC) 2.5 MG tablet     Signed, Robyn Haber, MD

## 2017-02-22 ENCOUNTER — Other Ambulatory Visit: Payer: Self-pay | Admitting: Family Medicine

## 2017-02-22 DIAGNOSIS — I10 Essential (primary) hypertension: Secondary | ICD-10-CM

## 2017-02-22 NOTE — Telephone Encounter (Signed)
Please call this patient to schedule a visit, as Dr. Joseph Art has retired.  Meds ordered this encounter  Medications  . amLODipine (NORVASC) 2.5 MG tablet    Sig: TAKE ONE TABLET BY MOUTH ONCE DAILY    Dispense:  90 tablet    Refill:  0    Please notify patient that s/he needs an office visit +/- labsfor additional refills.

## 2017-05-29 ENCOUNTER — Other Ambulatory Visit: Payer: Self-pay | Admitting: Physician Assistant

## 2017-05-29 DIAGNOSIS — I10 Essential (primary) hypertension: Secondary | ICD-10-CM

## 2017-05-31 ENCOUNTER — Other Ambulatory Visit: Payer: Self-pay | Admitting: Physician Assistant

## 2017-05-31 DIAGNOSIS — I10 Essential (primary) hypertension: Secondary | ICD-10-CM

## 2017-06-01 DIAGNOSIS — S72009A Fracture of unspecified part of neck of unspecified femur, initial encounter for closed fracture: Secondary | ICD-10-CM

## 2017-06-01 HISTORY — DX: Fracture of unspecified part of neck of unspecified femur, initial encounter for closed fracture: S72.009A

## 2017-06-19 ENCOUNTER — Encounter (HOSPITAL_COMMUNITY): Payer: Self-pay | Admitting: *Deleted

## 2017-06-19 ENCOUNTER — Emergency Department (HOSPITAL_COMMUNITY): Payer: Self-pay

## 2017-06-19 ENCOUNTER — Inpatient Hospital Stay (HOSPITAL_COMMUNITY)
Admission: EM | Admit: 2017-06-19 | Discharge: 2017-06-22 | DRG: 470 | Disposition: A | Discharge status: Home / Self Care | Payer: Self-pay | Attending: Internal Medicine | Admitting: Internal Medicine

## 2017-06-19 DIAGNOSIS — Z09 Encounter for follow-up examination after completed treatment for conditions other than malignant neoplasm: Secondary | ICD-10-CM

## 2017-06-19 DIAGNOSIS — D75839 Thrombocytosis, unspecified: Secondary | ICD-10-CM | POA: Diagnosis present

## 2017-06-19 DIAGNOSIS — S72009A Fracture of unspecified part of neck of unspecified femur, initial encounter for closed fracture: Secondary | ICD-10-CM

## 2017-06-19 DIAGNOSIS — Z823 Family history of stroke: Secondary | ICD-10-CM

## 2017-06-19 DIAGNOSIS — I2510 Atherosclerotic heart disease of native coronary artery without angina pectoris: Secondary | ICD-10-CM | POA: Diagnosis present

## 2017-06-19 DIAGNOSIS — R7989 Other specified abnormal findings of blood chemistry: Secondary | ICD-10-CM | POA: Diagnosis not present

## 2017-06-19 DIAGNOSIS — M810 Age-related osteoporosis without current pathological fracture: Secondary | ICD-10-CM | POA: Diagnosis present

## 2017-06-19 DIAGNOSIS — S72001A Fracture of unspecified part of neck of right femur, initial encounter for closed fracture: Secondary | ICD-10-CM

## 2017-06-19 DIAGNOSIS — W010XXA Fall on same level from slipping, tripping and stumbling without subsequent striking against object, initial encounter: Secondary | ICD-10-CM | POA: Diagnosis present

## 2017-06-19 DIAGNOSIS — Y9301 Activity, walking, marching and hiking: Secondary | ICD-10-CM | POA: Diagnosis present

## 2017-06-19 DIAGNOSIS — Z8673 Personal history of transient ischemic attack (TIA), and cerebral infarction without residual deficits: Secondary | ICD-10-CM

## 2017-06-19 DIAGNOSIS — S72031A Displaced midcervical fracture of right femur, initial encounter for closed fracture: Principal | ICD-10-CM | POA: Diagnosis present

## 2017-06-19 DIAGNOSIS — Z419 Encounter for procedure for purposes other than remedying health state, unspecified: Secondary | ICD-10-CM

## 2017-06-19 DIAGNOSIS — D62 Acute posthemorrhagic anemia: Secondary | ICD-10-CM | POA: Diagnosis not present

## 2017-06-19 DIAGNOSIS — Z8249 Family history of ischemic heart disease and other diseases of the circulatory system: Secondary | ICD-10-CM

## 2017-06-19 DIAGNOSIS — Z79899 Other long term (current) drug therapy: Secondary | ICD-10-CM

## 2017-06-19 DIAGNOSIS — D473 Essential (hemorrhagic) thrombocythemia: Secondary | ICD-10-CM | POA: Diagnosis present

## 2017-06-19 DIAGNOSIS — E876 Hypokalemia: Secondary | ICD-10-CM | POA: Diagnosis not present

## 2017-06-19 DIAGNOSIS — I10 Essential (primary) hypertension: Secondary | ICD-10-CM | POA: Diagnosis present

## 2017-06-19 DIAGNOSIS — Z7982 Long term (current) use of aspirin: Secondary | ICD-10-CM

## 2017-06-19 DIAGNOSIS — Z96642 Presence of left artificial hip joint: Secondary | ICD-10-CM | POA: Diagnosis present

## 2017-06-19 HISTORY — DX: Adverse effect of unspecified anesthetic, initial encounter: T41.45XA

## 2017-06-19 HISTORY — DX: Atherosclerotic heart disease of native coronary artery without angina pectoris: I25.10

## 2017-06-19 HISTORY — DX: Other complications of anesthesia, initial encounter: T88.59XA

## 2017-06-19 HISTORY — DX: Fracture of unspecified part of neck of unspecified femur, initial encounter for closed fracture: S72.009A

## 2017-06-19 LAB — CBC WITH DIFFERENTIAL/PLATELET
BASOS PCT: 1 %
Basophils Absolute: 0.1 10*3/uL (ref 0.0–0.1)
Eosinophils Absolute: 0.1 10*3/uL (ref 0.0–0.7)
Eosinophils Relative: 1 %
HCT: 47 % — ABNORMAL HIGH (ref 36.0–46.0)
Hemoglobin: 15.3 g/dL — ABNORMAL HIGH (ref 12.0–15.0)
Lymphocytes Relative: 9 %
Lymphs Abs: 0.9 10*3/uL (ref 0.7–4.0)
MCH: 27.4 pg (ref 26.0–34.0)
MCHC: 32.6 g/dL (ref 30.0–36.0)
MCV: 84.1 fL (ref 78.0–100.0)
Monocytes Absolute: 0.5 10*3/uL (ref 0.1–1.0)
Monocytes Relative: 5 %
NEUTROS ABS: 7.9 10*3/uL — AB (ref 1.7–7.7)
Neutrophils Relative %: 84 %
Platelets: 795 10*3/uL — ABNORMAL HIGH (ref 150–400)
RBC: 5.59 MIL/uL — ABNORMAL HIGH (ref 3.87–5.11)
RDW: 15 % (ref 11.5–15.5)
WBC: 9.4 10*3/uL (ref 4.0–10.5)

## 2017-06-19 LAB — URINALYSIS, ROUTINE W REFLEX MICROSCOPIC
Bilirubin Urine: NEGATIVE
GLUCOSE, UA: NEGATIVE mg/dL
Hgb urine dipstick: NEGATIVE
KETONES UR: NEGATIVE mg/dL
LEUKOCYTES UA: NEGATIVE
NITRITE: NEGATIVE
Protein, ur: NEGATIVE mg/dL
Specific Gravity, Urine: 1.011 (ref 1.005–1.030)
pH: 7 (ref 5.0–8.0)

## 2017-06-19 LAB — BASIC METABOLIC PANEL
ANION GAP: 9 (ref 5–15)
BUN: 19 mg/dL (ref 6–20)
CALCIUM: 9 mg/dL (ref 8.9–10.3)
CO2: 25 mmol/L (ref 22–32)
Chloride: 105 mmol/L (ref 101–111)
Creatinine, Ser: 0.99 mg/dL (ref 0.44–1.00)
GFR calc Af Amer: 58 mL/min — ABNORMAL LOW (ref >60)
GFR calc non Af Amer: 50 mL/min — ABNORMAL LOW (ref >60)
Glucose, Bld: 89 mg/dL (ref 65–99)
Potassium: 3.8 mmol/L (ref 3.5–5.1)
Sodium: 139 mmol/L (ref 135–145)

## 2017-06-19 LAB — PROTIME-INR
INR: 0.96
Prothrombin Time: 12.8 seconds (ref 11.4–15.2)

## 2017-06-19 LAB — TYPE AND SCREEN
ABO/RH(D): O POS
Antibody Screen: NEGATIVE

## 2017-06-19 LAB — ABO/RH: ABO/RH(D): O POS

## 2017-06-19 MED ORDER — TRAMADOL HCL 50 MG PO TABS
50.0000 mg | ORAL_TABLET | Freq: Four times a day (QID) | ORAL | Status: DC | PRN
  Filled 2017-06-19: qty 1

## 2017-06-19 MED ORDER — OMEGA-3-ACID ETHYL ESTERS 1 G PO CAPS
1.0000 g | ORAL_CAPSULE | Freq: Every day | ORAL | Status: DC
  Administered 2017-06-20 – 2017-06-22 (×3): 1 g via ORAL
  Filled 2017-06-19 (×3): qty 1

## 2017-06-19 MED ORDER — FENTANYL CITRATE (PF) 100 MCG/2ML IJ SOLN
50.0000 ug | INTRAMUSCULAR | Status: DC | PRN
  Administered 2017-06-19: 50 ug via INTRAVENOUS
  Filled 2017-06-19: qty 2

## 2017-06-19 MED ORDER — HEPARIN SODIUM (PORCINE) 5000 UNIT/ML IJ SOLN: 5000.0000 [IU] | Freq: Three times a day (TID) | INTRAMUSCULAR | Status: DC

## 2017-06-19 MED ORDER — SODIUM CHLORIDE 0.9 % IV SOLN
INTRAVENOUS | Status: DC
  Administered 2017-06-19 – 2017-06-20 (×3): via INTRAVENOUS

## 2017-06-19 MED ORDER — AMLODIPINE BESYLATE 2.5 MG PO TABS
2.5000 mg | ORAL_TABLET | Freq: Every day | ORAL | Status: DC
Start: 2017-06-20 — End: 2017-06-22
  Administered 2017-06-20 – 2017-06-22 (×2): 2.5 mg via ORAL
  Filled 2017-06-19 (×4): qty 1

## 2017-06-19 NOTE — Progress Notes (Addendum)
Ortho Note  I have reviewed the images and discussed this case with the ED physician.  This patient will need operative intervention with arthroplasty for the right hip fracture.  She will be transferred to [facility] for operative treatment tomorrow, by my partner Dr. Lyla Glassing.  Full consult note to follow

## 2017-06-19 NOTE — ED Provider Notes (Signed)
[facility] DEPT Provider Note   CSN: [id number] Arrival date & time: 06/19/17  1028     History   Chief Complaint Chief Complaint  Patient presents with  . Fall  . Hip Pain    HPI Jill Castaneda is a 83 y.o. female.  Pt presents to the ED today with right hip pain s/p fall.  Pt speaks [place] and her family member is translating.   The pt was outside walking and slipped on a puddle.  She noticed right hip pain after fall.  The pt denies any other injuries.  She is not on blood thinners.  She denies loc.      Past Medical History:  Diagnosis Date  . Cataract   . Coronary artery disease   . Hypertension   . Osteoporosis   . Stroke [facility])     Patient Active Problem List   Diagnosis Date Noted  . NSTEMI (non-ST elevated myocardial infarction) ([place]) 04/15/2014  . Non-STEMI (non-ST elevated myocardial infarction) ([place]) 04/13/2014  . Hypertension   . CVA (cerebral infarction) 03/08/2014    Past Surgical History:  Procedure Laterality Date  . FRACTURE SURGERY    . SHOULDER HARDWARE REMOVAL Left   . SHOULDER HEMI-ARTHROPLASTY Left   . TOTAL HIP ARTHROPLASTY Right     OB History    No data available       Home Medications    Prior to Admission medications   Medication Sig Start Date End Date Taking? Authorizing Provider  amLODipine (NORVASC) 2.5 MG tablet TAKE ONE TABLET BY MOUTH ONCE DAILY 02/22/17  Yes Jacqulynn Cadet, [place], PA-C  aspirin EC 81 MG tablet Take 1 tablet (81 mg total) by mouth daily. 03/09/14  Yes Thurnell Lose, MD  Multiple Vitamin (MULTIVITAMIN WITH MINERALS) TABS tablet Take 1 tablet by mouth once a week.    Yes [provider]  omega-3 acid ethyl esters (LOVAZA) 1 g capsule Take 1 g by mouth daily.   Yes [provider]    Family History Family History  Problem Relation Age of Onset  . Hypertension Father   . Heart disease Father   . Hypertension Sister   . Heart disease Sister   . Stroke Mother   . Cancer Brother      Social History Social History  Substance Use Topics  . Smoking status: Never Smoker  . Smokeless tobacco: Never Used  . Alcohol use No     Allergies   Patient has no known allergies.   Review of Systems Review of Systems  Musculoskeletal:       Right hip pain  All other systems reviewed and are negative.    Physical Exam Updated Vital Signs BP (!) 198/94 (BP Location: Right Arm)   Pulse 66   Temp 98.8 F (37.1 C) (Oral)   Resp 13   Ht 4\' 9"  (1.448 m)   Wt 42.2 kg (93 lb)   SpO2 100%   BMI 20.13 kg/m   Physical Exam  Constitutional: She is oriented to person, place, and time. She appears well-developed and well-nourished.  HENT:  Head: Normocephalic and atraumatic.  Right Ear: External ear normal.  Left Ear: External ear normal.  Nose: Nose normal.  Mouth/Throat: Oropharynx is clear and moist.  Eyes: Pupils are equal, round, and reactive to light. Conjunctivae and EOM are normal.  Neck: Normal range of motion. Neck supple.  Cardiovascular: Normal rate, regular rhythm, normal heart sounds and intact distal pulses.   Pulmonary/Chest: Effort normal and breath  sounds normal.  Abdominal: Soft. Bowel sounds are normal.  Musculoskeletal:       Right hip: She exhibits decreased range of motion and tenderness.  Neurological: She is alert and oriented to person, place, and time.  Skin: Skin is warm.  Psychiatric: She has a normal mood and affect. Her behavior is normal. Judgment and thought content normal.  Nursing note and vitals reviewed.    ED Treatments / Results  Labs (all labs ordered are listed, but only abnormal results are displayed) Labs Reviewed  BASIC METABOLIC PANEL - Abnormal; Notable for the following:       Result Value   GFR calc non Af Amer 50 (*)    GFR calc Af Amer 58 (*)    All other components within normal limits  CBC WITH DIFFERENTIAL/PLATELET - Abnormal; Notable for the following:    RBC 5.59 (*)    Hemoglobin 15.3 (*)    HCT  47.0 (*)    Platelets 795 (*)    Neutro Abs 7.9 (*)    All other components within normal limits  PROTIME-INR  URINALYSIS, ROUTINE W REFLEX MICROSCOPIC  TYPE AND SCREEN    EKG  EKG Interpretation None       Radiology Dg Chest 1 View  Result Date: 06/19/2017 CLINICAL DATA:  Pt fell this AM at her home. Pt having right side hip pain from fall. Preop exam. Takes HTN meds and has a hx of having a stroke. EXAM: CHEST 1 VIEW COMPARISON:  04/12/2014 FINDINGS: Cardiac silhouette is normal in size. No mediastinal or hilar masses or evidence of adenopathy. Clear lungs. No pleural effusion or pneumothorax. Skeletal structures are diffusely demineralized but grossly intact. IMPRESSION: No acute cardiopulmonary disease. Electronically Signed   By: Lajean Manes M.D.   On: 06/19/2017 11:59   Dg Hip Unilat With Pelvis 2-3 Views Right  Result Date: 06/19/2017 CLINICAL DATA:  Pt fell this AM at her home. Pt having right side hip pain from fall. EXAM: DG HIP (WITH OR WITHOUT PELVIS) 2-3V RIGHT COMPARISON:  None. FINDINGS: There is a displaced fracture of the right femoral neck, mid cervical. The distal fracture component has displaced superiorly by 13 mm. There is mild varus angulation. No comminution. No other fractures.  No dislocation. Left hip arthroplasty appears well-seated and well-aligned. SI joints and symphysis pubis are normally aligned. Bones are demineralized. IMPRESSION: Displaced, non comminuted, mid cervical right femoral neck fracture. Mild varus angulation. Electronically Signed   By: Lajean Manes M.D.   On: 06/19/2017 12:00    Procedures Procedures (including critical care time)  Medications Ordered in ED Medications  fentaNYL (SUBLIMAZE) injection 50 mcg (50 mcg Intravenous Given 06/19/17 1121)     Initial Impression / Assessment and Plan / ED Course  I have reviewed the triage vital signs and the nursing notes.  Pertinent labs & imaging results that were available during my  care of the patient were reviewed by me and considered in my medical decision making (see chart for details).     Pt d/w Dr. Stann Mainland ([place]) who requests that pt go to [facility] for surgery.  Plan for surgery tomorrow afternoon.  Pt d/w Dr. Rodena Piety ([facility]) for admission at [facility].  Final Clinical Impressions(s) / ED Diagnoses   Final diagnoses:  Closed displaced fracture of right femoral neck ([place])    New Prescriptions New Prescriptions   No medications on file     Isla Pence, MD 06/19/17 1242

## 2017-06-19 NOTE — ED Triage Notes (Signed)
Pt speaks [place], daughter states she went for a walk this am, stepped in puddle and fell. Noted rt hip pain with slight shortening and rotation. No LOC or other injuries noted.

## 2017-06-19 NOTE — H&P (Signed)
History and Physical    Jill Castaneda [id number] DOB: October 14, 1928 DOA: 06/19/2017  PCP: Patient, No Pcp Per Patient coming from:home  Chief Complaint: hip pain  HPI: Jill Castaneda is a 84 y.o. female with medical history significant of stroke, CAD, hypertension, admitted with fall and right hip fracture patient has a displaced noncommunited mid cervical right femoral neck fracture with mild varus angulation. The patient is very active at baseline. She was walking the park this morning she missed a puddle and slipped and fell down and landed on her right hip. She denies any chest pain shortness of breath loss of consciousness headache changes with her vision. She is [place]-speaking. Her family her daughter is in the room who is translating for me. Patient lives at home with her family.    ED Course: She had a x-ray for hip which showed right midcervical femoral neck fracture the ER doctor communicated with our [place] Dr. Stann Mainland and is scheduled for surgery tomorrow afternoon.  Review of Systems: As per HPI otherwise all other systems reviewed and are negative  Ambulatory Status  Past Medical History:  Diagnosis Date  . Cataract   . Coronary artery disease   . Hypertension   . Osteoporosis   . Stroke [facility])     Past Surgical History:  Procedure Laterality Date  . FRACTURE SURGERY    . SHOULDER HARDWARE REMOVAL Left   . SHOULDER HEMI-ARTHROPLASTY Left   . TOTAL HIP ARTHROPLASTY Right     Social History   Social History  . Marital status: Married    Spouse name: N/A  . Number of children: 1  . Years of education: masters   Occupational History  . retired    Social History Main Topics  . Smoking status: Never Smoker  . Smokeless tobacco: Never Used  . Alcohol use No  . Drug use: No  . Sexual activity: No   Other Topics Concern  . Not on file   Social History Narrative   Patient lives with granddaughter, patient drinks caffeine daily.    No Known  Allergies  Family History  Problem Relation Age of Onset  . Hypertension Father   . Heart disease Father   . Hypertension Sister   . Heart disease Sister   . Stroke Mother   . Cancer Brother       Prior to Admission medications   Medication Sig Start Date End Date Taking? Authorizing Provider  amLODipine (NORVASC) 2.5 MG tablet TAKE ONE TABLET BY MOUTH ONCE DAILY 02/22/17  Yes Jacqulynn Cadet, [place], PA-C  aspirin EC 81 MG tablet Take 1 tablet (81 mg total) by mouth daily. 03/09/14  Yes Thurnell Lose, MD  Multiple Vitamin (MULTIVITAMIN WITH MINERALS) TABS tablet Take 1 tablet by mouth once a week.    Yes [provider]  omega-3 acid ethyl esters (LOVAZA) 1 g capsule Take 1 g by mouth daily.   Yes [provider]    Physical Exam: Vitals:   06/19/17 1047 06/19/17 1054  BP:  (!) 198/94  Pulse:  66  Resp:  13  Temp:  98.8 F (37.1 C)  TempSrc:  Oral  SpO2:  100%  Weight: 42.2 kg (93 lb)   Height: 4\' 9"  (1.448 m)      General:  Appears calm and comfortable Eyes:  PERRL, EOMI, normal lids, iris ENT: grossly normal hearing, lips & tongue, mmm Neck:  no LAD, masses or thyromegaly Cardiovascular:  RRR, no m/r/g. No LE edema.  Respiratory:  CTA bilaterally, no w/r/r. Normal respiratory effort. Abdomen:  soft, ntnd, NABS Skin:  no rash or induration seen on limited exam Musculoskeletal:  grossly normal tone BUE/BLE, good ROM, no bony abnormality Psychiatric: grossly normal mood and affect, speech fluent and appropriate, AOx3 Neurologic:  CN 2-12 grossly intact, moves all extremities in coordinated fashion, sensation intact  Labs on Admission: I have personally reviewed following labs and imaging studies  CBC:  Recent Labs Lab 06/19/17 1119  WBC 9.4  NEUTROABS 7.9*  HGB 15.3*  HCT 47.0*  MCV 84.1  PLT 034*   Basic Metabolic Panel:  Recent Labs Lab 06/19/17 1119  NA 139  K 3.8  CL 105  CO2 25  GLUCOSE 89  BUN 19  CREATININE 0.99  CALCIUM 9.0    GFR: Estimated Creatinine Clearance: 24.4 mL/min (by C-G formula based on SCr of 0.99 mg/dL). Liver Function Tests: No results for input(s): AST, ALT, ALKPHOS, BILITOT, PROT, ALBUMIN in the last 168 hours. No results for input(s): LIPASE, AMYLASE in the last 168 hours. No results for input(s): AMMONIA in the last 168 hours. Coagulation Profile:  Recent Labs Lab 06/19/17 1119  INR 0.96   Cardiac Enzymes: No results for input(s): CKTOTAL, CKMB, CKMBINDEX, TROPONINI in the last 168 hours. BNP (last 3 results) No results for input(s): PROBNP in the last 8760 hours. HbA1C: No results for input(s): HGBA1C in the last 72 hours. CBG: No results for input(s): GLUCAP in the last 168 hours. Lipid Profile: No results for input(s): CHOL, HDL, LDLCALC, TRIG, CHOLHDL, LDLDIRECT in the last 72 hours. Thyroid Function Tests: No results for input(s): TSH, T4TOTAL, FREET4, T3FREE, THYROIDAB in the last 72 hours. Anemia Panel: No results for input(s): VITAMINB12, FOLATE, FERRITIN, TIBC, IRON, RETICCTPCT in the last 72 hours. Urine analysis:    Component Value Date/Time   COLORURINE YELLOW 06/19/2017 1110   APPEARANCEUR CLEAR 06/19/2017 1110   LABSPEC 1.011 06/19/2017 1110   PHURINE 7.0 06/19/2017 1110   GLUCOSEU NEGATIVE 06/19/2017 1110   HGBUR NEGATIVE 06/19/2017 1110   BILIRUBINUR NEGATIVE 06/19/2017 1110   KETONESUR NEGATIVE 06/19/2017 1110   PROTEINUR NEGATIVE 06/19/2017 1110   NITRITE NEGATIVE 06/19/2017 1110   LEUKOCYTESUR NEGATIVE 06/19/2017 1110    Creatinine Clearance: Estimated Creatinine Clearance: 24.4 mL/min (by C-G formula based on SCr of 0.99 mg/dL).  Sepsis Labs: @LABRCNTIP (procalcitonin:4,lacticidven:4) )No results found for this or any previous visit (from the past 240 hour(s)).   Radiological Exams on Admission: Dg Chest 1 View  Result Date: 06/19/2017 CLINICAL DATA:  Pt fell this AM at her home. Pt having right side hip pain from fall. Preop exam. Takes HTN  meds and has a hx of having a stroke. EXAM: CHEST 1 VIEW COMPARISON:  04/12/2014 FINDINGS: Cardiac silhouette is normal in size. No mediastinal or hilar masses or evidence of adenopathy. Clear lungs. No pleural effusion or pneumothorax. Skeletal structures are diffusely demineralized but grossly intact. IMPRESSION: No acute cardiopulmonary disease. Electronically Signed   By: Lajean Manes M.D.   On: 06/19/2017 11:59   Dg Hip Unilat With Pelvis 2-3 Views Right  Result Date: 06/19/2017 CLINICAL DATA:  Pt fell this AM at her home. Pt having right side hip pain from fall. EXAM: DG HIP (WITH OR WITHOUT PELVIS) 2-3V RIGHT COMPARISON:  None. FINDINGS: There is a displaced fracture of the right femoral neck, mid cervical. The distal fracture component has displaced superiorly by 13 mm. There is mild varus angulation. No comminution. No other fractures.  No dislocation. Left hip  arthroplasty appears well-seated and well-aligned. SI joints and symphysis pubis are normally aligned. Bones are demineralized. IMPRESSION: Displaced, non comminuted, mid cervical right femoral neck fracture. Mild varus angulation. Electronically Signed   By: Lajean Manes M.D.   On: 06/19/2017 12:00   Dg Femur [place], [place] 2 Views Right  Result Date: 06/19/2017 CLINICAL DATA:  Fracture right hip. EXAM: RIGHT FEMUR PORTABLE 2 VIEW COMPARISON:  None. FINDINGS: The femur was imaged from below the hip through the knee. No additional fractures identified on this study. IMPRESSION: No fractures through the mid or distal femur. Electronically Signed   By: Dorise Bullion III M.D   On: 06/19/2017 13:10    EKG: Independently reviewed.   Assessment/Plan Active Problems:   Hip fracture (HCC)   Right hip fracture patient to be transferred to [facility] for surgery tomorrow afternoon by Dr. Stann Mainland. Patient will be nothing by mouth after midnight. Will give her slow IV hydration. I will also put her on subcutaneous heparin for DVT  prophylaxis.  Hypertension continue her home medication Norvasc.  History of CAD stable at this time.  Disposition patient will probably need rehabilitation prior to discharge home DVT prophylaxis:heparin Code Status: full Family Communication:  Disposition Plan:  Consults called: dr Stann Mainland  Admission status: med surg   Georgette Shell MD [facility]  If 7PM-7AM, please contact night-coverage [email or web address] Password [email or web address]  06/19/2017, 1:25 PM

## 2017-06-19 NOTE — ED Notes (Signed)
I have just given report to Merrie Roof, Therapist, sports at [facility]. I also notified [facility].

## 2017-06-20 ENCOUNTER — Encounter (HOSPITAL_COMMUNITY): Payer: Self-pay | Admitting: General Practice

## 2017-06-20 ENCOUNTER — Inpatient Hospital Stay (HOSPITAL_COMMUNITY): Payer: Self-pay | Admitting: Certified Registered"

## 2017-06-20 ENCOUNTER — Encounter (HOSPITAL_COMMUNITY)
Admission: EM | Disposition: A | Discharge status: Home / Self Care | Payer: Self-pay | Source: Home / Self Care | Attending: Internal Medicine

## 2017-06-20 ENCOUNTER — Inpatient Hospital Stay (HOSPITAL_COMMUNITY): Payer: Self-pay

## 2017-06-20 ENCOUNTER — Inpatient Hospital Stay: Admit: 2017-06-20 | Payer: Self-pay | Admitting: Orthopedic Surgery

## 2017-06-20 DIAGNOSIS — S72001A Fracture of unspecified part of neck of right femur, initial encounter for closed fracture: Secondary | ICD-10-CM | POA: Diagnosis present

## 2017-06-20 DIAGNOSIS — D75839 Thrombocytosis, unspecified: Secondary | ICD-10-CM | POA: Diagnosis present

## 2017-06-20 DIAGNOSIS — D473 Essential (hemorrhagic) thrombocythemia: Secondary | ICD-10-CM | POA: Diagnosis present

## 2017-06-20 HISTORY — PX: ANTERIOR APPROACH HEMI HIP ARTHROPLASTY: SHX6690

## 2017-06-20 LAB — ABO/RH: ABO/RH(D): O POS

## 2017-06-20 LAB — CBC
HCT: 41.7 % (ref 36.0–46.0)
Hemoglobin: 13.6 g/dL (ref 12.0–15.0)
MCH: 26.9 pg (ref 26.0–34.0)
MCHC: 32.6 g/dL (ref 30.0–36.0)
MCV: 82.6 fL (ref 78.0–100.0)
PLATELETS: 697 10*3/uL — AB (ref 150–400)
RBC: 5.05 MIL/uL (ref 3.87–5.11)
RDW: 14.7 % (ref 11.5–15.5)
WBC: 9.1 10*3/uL (ref 4.0–10.5)

## 2017-06-20 LAB — BASIC METABOLIC PANEL
Anion gap: 7 (ref 5–15)
BUN: 17 mg/dL (ref 6–20)
CO2: 23 mmol/L (ref 22–32)
Calcium: 8 mg/dL — ABNORMAL LOW (ref 8.9–10.3)
Chloride: 107 mmol/L (ref 101–111)
Creatinine, Ser: 0.99 mg/dL (ref 0.44–1.00)
GFR calc Af Amer: 58 mL/min — ABNORMAL LOW (ref >60)
GFR calc non Af Amer: 50 mL/min — ABNORMAL LOW (ref >60)
Glucose, Bld: 102 mg/dL — ABNORMAL HIGH (ref 65–99)
Potassium: 3.5 mmol/L (ref 3.5–5.1)
Sodium: 137 mmol/L (ref 135–145)

## 2017-06-20 LAB — SURGICAL PCR SCREEN
MRSA, PCR: NEGATIVE
Staphylococcus aureus: NEGATIVE

## 2017-06-20 LAB — TYPE AND SCREEN
ABO/RH(D): O POS
Antibody Screen: NEGATIVE

## 2017-06-20 SURGERY — HEMIARTHROPLASTY, HIP, DIRECT ANTERIOR APPROACH, FOR FRACTURE
Anesthesia: Spinal | Site: Hip | Laterality: Right

## 2017-06-20 MED ORDER — MENTHOL 3 MG MT LOZG: 1.0000 | LOZENGE | OROMUCOSAL | Status: DC | PRN

## 2017-06-20 MED ORDER — METOCLOPRAMIDE HCL 5 MG PO TABS: 5.0000 mg | ORAL_TABLET | Freq: Three times a day (TID) | ORAL | Status: DC | PRN

## 2017-06-20 MED ORDER — PHENOL 1.4 % MT LIQD: 1.0000 | OROMUCOSAL | Status: DC | PRN

## 2017-06-20 MED ORDER — 0.9 % SODIUM CHLORIDE (POUR BTL) OPTIME
TOPICAL | Status: DC | PRN
  Administered 2017-06-20: 1000 mL

## 2017-06-20 MED ORDER — ONDANSETRON HCL 4 MG/2ML IJ SOLN
INTRAMUSCULAR | Status: AC
  Filled 2017-06-20: qty 2

## 2017-06-20 MED ORDER — ACETAMINOPHEN 10 MG/ML IV SOLN: 1000.0000 mg | INTRAVENOUS | Status: DC

## 2017-06-20 MED ORDER — PROPOFOL 500 MG/50ML IV EMUL
INTRAVENOUS | Status: DC | PRN
  Administered 2017-06-20: 30 ug/kg/min via INTRAVENOUS

## 2017-06-20 MED ORDER — HYDROMORPHONE HCL 1 MG/ML IJ SOLN: 0.2500 mg | INTRAMUSCULAR | Status: DC | PRN

## 2017-06-20 MED ORDER — MORPHINE SULFATE (PF) 4 MG/ML IV SOLN: 0.5000 mg | INTRAVENOUS | Status: DC | PRN

## 2017-06-20 MED ORDER — CHLORHEXIDINE GLUCONATE 4 % EX LIQD: 60.0000 mL | Freq: Once | CUTANEOUS | Status: DC

## 2017-06-20 MED ORDER — DOCUSATE SODIUM 100 MG PO CAPS
100.0000 mg | ORAL_CAPSULE | Freq: Two times a day (BID) | ORAL | Status: DC
  Administered 2017-06-20 – 2017-06-21 (×3): 100 mg via ORAL
  Filled 2017-06-20 (×4): qty 1

## 2017-06-20 MED ORDER — ARTIFICIAL TEARS OPHTHALMIC OINT
TOPICAL_OINTMENT | OPHTHALMIC | Status: AC
  Filled 2017-06-20: qty 3.5

## 2017-06-20 MED ORDER — KETOROLAC TROMETHAMINE 30 MG/ML IJ SOLN
INTRAMUSCULAR | Status: AC
  Filled 2017-06-20: qty 1

## 2017-06-20 MED ORDER — METOCLOPRAMIDE HCL 5 MG/ML IJ SOLN: 5.0000 mg | Freq: Three times a day (TID) | INTRAMUSCULAR | Status: DC | PRN

## 2017-06-20 MED ORDER — LACTATED RINGERS IV SOLN
INTRAVENOUS | Status: DC
  Administered 2017-06-20: 18:00:00 via INTRAVENOUS

## 2017-06-20 MED ORDER — BUPIVACAINE-EPINEPHRINE (PF) 0.5% -1:200000 IJ SOLN
INTRAMUSCULAR | Status: DC | PRN
  Administered 2017-06-20: 30 mL

## 2017-06-20 MED ORDER — KETOROLAC TROMETHAMINE 30 MG/ML IJ SOLN
INTRAMUSCULAR | Status: DC | PRN
  Administered 2017-06-20: 30 mg via INTRAVENOUS

## 2017-06-20 MED ORDER — METHOCARBAMOL 500 MG PO TABS: 500.0000 mg | ORAL_TABLET | Freq: Four times a day (QID) | ORAL | Status: DC | PRN

## 2017-06-20 MED ORDER — OXYCODONE HCL 5 MG/5ML PO SOLN: 5.0000 mg | Freq: Once | ORAL | Status: DC | PRN

## 2017-06-20 MED ORDER — CEFAZOLIN SODIUM-DEXTROSE 2-4 GM/100ML-% IV SOLN
2.0000 g | INTRAVENOUS | Status: AC
  Administered 2017-06-20: 2 g via INTRAVENOUS
  Filled 2017-06-20 (×2): qty 100

## 2017-06-20 MED ORDER — CEFAZOLIN SODIUM-DEXTROSE 1-4 GM/50ML-% IV SOLN
1.0000 g | Freq: Two times a day (BID) | INTRAVENOUS | Status: AC
  Administered 2017-06-21: 1 g via INTRAVENOUS
  Filled 2017-06-20: qty 50

## 2017-06-20 MED ORDER — SODIUM CHLORIDE 0.9 % IV SOLN: INTRAVENOUS | Status: DC

## 2017-06-20 MED ORDER — POVIDONE-IODINE 10 % EX SWAB: 2.0000 "application " | Freq: Once | CUTANEOUS | Status: DC

## 2017-06-20 MED ORDER — PROMETHAZINE HCL 25 MG/ML IJ SOLN: 6.2500 mg | INTRAMUSCULAR | Status: DC | PRN

## 2017-06-20 MED ORDER — SODIUM CHLORIDE 0.9 % IV SOLN
INTRAVENOUS | Status: AC | PRN
  Administered 2017-06-20: 1000 mL via INTRAMUSCULAR

## 2017-06-20 MED ORDER — EPHEDRINE SULFATE-NACL 50-0.9 MG/10ML-% IV SOSY
PREFILLED_SYRINGE | INTRAVENOUS | Status: DC | PRN
  Administered 2017-06-20: 5 mg via INTRAVENOUS
  Administered 2017-06-20: 10 mg via INTRAVENOUS

## 2017-06-20 MED ORDER — METHOCARBAMOL 1000 MG/10ML IJ SOLN
500.0000 mg | Freq: Four times a day (QID) | INTRAVENOUS | Status: DC | PRN
  Filled 2017-06-20: qty 5

## 2017-06-20 MED ORDER — PROPOFOL 10 MG/ML IV BOLUS
INTRAVENOUS | Status: DC | PRN
  Administered 2017-06-20: 10 mg via INTRAVENOUS

## 2017-06-20 MED ORDER — CEFAZOLIN SODIUM-DEXTROSE 2-4 GM/100ML-% IV SOLN: 2.0000 g | INTRAVENOUS | Status: DC

## 2017-06-20 MED ORDER — SENNA 8.6 MG PO TABS
1.0000 | ORAL_TABLET | Freq: Two times a day (BID) | ORAL | Status: DC
  Administered 2017-06-20 – 2017-06-21 (×3): 8.6 mg via ORAL
  Filled 2017-06-20 (×4): qty 1

## 2017-06-20 MED ORDER — FENTANYL CITRATE (PF) 250 MCG/5ML IJ SOLN
INTRAMUSCULAR | Status: AC
  Filled 2017-06-20: qty 5

## 2017-06-20 MED ORDER — OXYCODONE HCL 5 MG PO TABS: 5.0000 mg | ORAL_TABLET | Freq: Once | ORAL | Status: DC | PRN

## 2017-06-20 MED ORDER — HYDROCODONE-ACETAMINOPHEN 5-325 MG PO TABS: 1.0000 | ORAL_TABLET | Freq: Four times a day (QID) | ORAL | Status: DC | PRN

## 2017-06-20 MED ORDER — TRANEXAMIC ACID 1000 MG/10ML IV SOLN
1000.0000 mg | INTRAVENOUS | Status: DC
  Filled 2017-06-20: qty 10

## 2017-06-20 MED ORDER — TRANEXAMIC ACID 1000 MG/10ML IV SOLN
1000.0000 mg | INTRAVENOUS | Status: AC
  Administered 2017-06-20: 1000 mg via INTRAVENOUS
  Filled 2017-06-20: qty 10

## 2017-06-20 MED ORDER — ONDANSETRON HCL 4 MG/2ML IJ SOLN: 4.0000 mg | Freq: Four times a day (QID) | INTRAMUSCULAR | Status: DC | PRN

## 2017-06-20 MED ORDER — SODIUM CHLORIDE 0.9 % IJ SOLN
INTRAMUSCULAR | Status: DC | PRN
  Administered 2017-06-20: 30 mL

## 2017-06-20 MED ORDER — POLYETHYLENE GLYCOL 3350 17 G PO PACK: 17.0000 g | PACK | Freq: Every day | ORAL | Status: DC | PRN

## 2017-06-20 MED ORDER — PHENYLEPHRINE 40 MCG/ML (10ML) SYRINGE FOR IV PUSH (FOR BLOOD PRESSURE SUPPORT)
PREFILLED_SYRINGE | INTRAVENOUS | Status: AC
  Filled 2017-06-20: qty 10

## 2017-06-20 MED ORDER — ASPIRIN 81 MG PO CHEW
81.0000 mg | CHEWABLE_TABLET | Freq: Two times a day (BID) | ORAL | Status: DC
  Administered 2017-06-21 – 2017-06-22 (×3): 81 mg via ORAL
  Filled 2017-06-20 (×3): qty 1

## 2017-06-20 MED ORDER — ACETAMINOPHEN 325 MG PO TABS
650.0000 mg | ORAL_TABLET | Freq: Four times a day (QID) | ORAL | Status: DC | PRN
  Administered 2017-06-20: 650 mg via ORAL
  Administered 2017-06-21: 325 mg via ORAL
  Filled 2017-06-20 (×2): qty 2

## 2017-06-20 MED ORDER — ACETAMINOPHEN 10 MG/ML IV SOLN
1000.0000 mg | INTRAVENOUS | Status: AC
  Filled 2017-06-20: qty 100

## 2017-06-20 MED ORDER — ONDANSETRON HCL 4 MG/2ML IJ SOLN
INTRAMUSCULAR | Status: DC | PRN
  Administered 2017-06-20: 4 mg via INTRAVENOUS

## 2017-06-20 MED ORDER — SODIUM CHLORIDE 0.9 % IR SOLN
Status: DC | PRN
  Administered 2017-06-20: 3000 mL

## 2017-06-20 MED ORDER — KETAMINE HCL 10 MG/ML IJ SOLN
INTRAMUSCULAR | Status: DC | PRN
  Administered 2017-06-20: 20 mg via INTRAVENOUS

## 2017-06-20 MED ORDER — BUPIVACAINE-EPINEPHRINE (PF) 0.5% -1:200000 IJ SOLN
INTRAMUSCULAR | Status: AC
  Filled 2017-06-20: qty 30

## 2017-06-20 MED ORDER — SODIUM CHLORIDE 0.9 % IJ SOLN
INTRAMUSCULAR | Status: AC
  Filled 2017-06-20: qty 10

## 2017-06-20 MED ORDER — ONDANSETRON HCL 4 MG PO TABS: 4.0000 mg | ORAL_TABLET | Freq: Four times a day (QID) | ORAL | Status: DC | PRN

## 2017-06-20 MED ORDER — FENTANYL CITRATE (PF) 100 MCG/2ML IJ SOLN
INTRAMUSCULAR | Status: DC | PRN
  Administered 2017-06-20 (×2): 25 ug via INTRAVENOUS

## 2017-06-20 SURGICAL SUPPLY — 59 items
BLADE CLIPPER SURG (BLADE) IMPLANT
BLADE SAW SGTL 18X1.27X75 (BLADE) ×2 IMPLANT
BLADE SAW SGTL 18X1.27X75MM (BLADE) ×1
CAPT HIP HEMI 2 ×3 IMPLANT
CHLORAPREP W/TINT 26ML (MISCELLANEOUS) ×6 IMPLANT
COVER SURGICAL LIGHT HANDLE (MISCELLANEOUS) ×6 IMPLANT
DERMABOND ADVANCED (GAUZE/BANDAGES/DRESSINGS) ×4
DERMABOND ADVANCED .7 DNX12 (GAUZE/BANDAGES/DRESSINGS) ×2 IMPLANT
DRAPE C-ARM 42X72 X-RAY (DRAPES) ×3 IMPLANT
DRAPE IMP U-DRAPE 54X76 (DRAPES) ×6 IMPLANT
DRAPE STERI IOBAN 125X83 (DRAPES) ×3 IMPLANT
DRAPE U-SHAPE 47X51 STRL (DRAPES) ×9 IMPLANT
DRSG AQUACEL AG ADV 3.5X10 (GAUZE/BANDAGES/DRESSINGS) ×3 IMPLANT
ELECT BLADE 4.0 EZ CLEAN MEGAD (MISCELLANEOUS) ×3
ELECT REM PT RETURN 9FT ADLT (ELECTROSURGICAL) ×3
ELECTRODE BLDE 4.0 EZ CLN MEGD (MISCELLANEOUS) ×1 IMPLANT
ELECTRODE REM PT RTRN 9FT ADLT (ELECTROSURGICAL) ×1 IMPLANT
EVACUATOR 1/8 PVC DRAIN (DRAIN) IMPLANT
FACESHIELD WRAPAROUND (MASK) ×3 IMPLANT
GLOVE BIO SURGEON STRL SZ7.5 (GLOVE) ×3 IMPLANT
GLOVE BIO SURGEON STRL SZ8.5 (GLOVE) ×6 IMPLANT
GLOVE BIOGEL PI IND STRL 6.5 (GLOVE) ×1 IMPLANT
GLOVE BIOGEL PI IND STRL 8 (GLOVE) ×1 IMPLANT
GLOVE BIOGEL PI IND STRL 8.5 (GLOVE) ×1 IMPLANT
GLOVE BIOGEL PI INDICATOR 6.5 (GLOVE) ×2
GLOVE BIOGEL PI INDICATOR 8 (GLOVE) ×2
GLOVE BIOGEL PI INDICATOR 8.5 (GLOVE) ×2
GOWN STRL REUS W/ TWL LRG LVL3 (GOWN DISPOSABLE) ×2 IMPLANT
GOWN STRL REUS W/TWL 2XL LVL3 (GOWN DISPOSABLE) ×3 IMPLANT
GOWN STRL REUS W/TWL LRG LVL3 (GOWN DISPOSABLE) ×4
HANDPIECE INTERPULSE COAX TIP (DISPOSABLE) ×2
HOOD PEEL AWAY FACE SHEILD DIS (HOOD) ×6 IMPLANT
KIT BASIN OR (CUSTOM PROCEDURE TRAY) ×3 IMPLANT
KIT ROOM TURNOVER OR (KITS) ×3 IMPLANT
MANIFOLD NEPTUNE II (INSTRUMENTS) ×3 IMPLANT
MARKER SKIN DUAL TIP RULER LAB (MISCELLANEOUS) ×3 IMPLANT
NEEDLE SPNL 18GX3.5 QUINCKE PK (NEEDLE) ×3 IMPLANT
NS IRRIG 1000ML POUR BTL (IV SOLUTION) ×3 IMPLANT
PACK TOTAL JOINT (CUSTOM PROCEDURE TRAY) ×3 IMPLANT
PACK UNIVERSAL I (CUSTOM PROCEDURE TRAY) ×3 IMPLANT
PAD ARMBOARD 7.5X6 YLW CONV (MISCELLANEOUS) ×6 IMPLANT
SEALER BIPOLAR AQUA 6.0 (INSTRUMENTS) ×3 IMPLANT
SET HNDPC FAN SPRY TIP SCT (DISPOSABLE) ×1 IMPLANT
SLEEVE CABLE 2MM VT (Orthopedic Implant) ×3 IMPLANT
SUCTION FRAZIER HANDLE 10FR (MISCELLANEOUS) ×2
SUCTION TUBE FRAZIER 10FR DISP (MISCELLANEOUS) ×1 IMPLANT
SUT ETHIBOND NAB CT1 #1 30IN (SUTURE) ×6 IMPLANT
SUT MNCRL AB 3-0 PS2 18 (SUTURE) ×3 IMPLANT
SUT MON AB 2-0 CT1 36 (SUTURE) ×3 IMPLANT
SUT VIC AB 1 CT1 27 (SUTURE) ×2
SUT VIC AB 1 CT1 27XBRD ANBCTR (SUTURE) ×1 IMPLANT
SUT VIC AB 2-0 CT1 27 (SUTURE) ×2
SUT VIC AB 2-0 CT1 TAPERPNT 27 (SUTURE) ×1 IMPLANT
SUT VLOC 180 0 24IN GS25 (SUTURE) ×3 IMPLANT
SYR 50ML LL SCALE MARK (SYRINGE) ×3 IMPLANT
TOWEL OR 17X24 6PK STRL BLUE (TOWEL DISPOSABLE) ×3 IMPLANT
TOWEL OR 17X26 10 PK STRL BLUE (TOWEL DISPOSABLE) ×3 IMPLANT
TRAY FOLEY CATH SILVER 16FR (SET/KITS/TRAYS/PACK) IMPLANT
WATER STERILE IRR 1000ML POUR (IV SOLUTION) IMPLANT

## 2017-06-20 NOTE — Progress Notes (Signed)
PHARMACY NOTE:  ANTIMICROBIAL RENAL DOSAGE ADJUSTMENT  Current antimicrobial regimen includes a mismatch between antimicrobial dosage and estimated renal function.  As per policy approved by the Pharmacy & Therapeutics and Medical Executive Committees, the antimicrobial dosage will be adjusted accordingly.  Current antimicrobial dosage:  Ancef 2gm IV Q6H x 2 doses  Indication: surgical prophylaxis   Renal Function:  Estimated Creatinine Clearance: 24.4 mL/min (by C-G formula based on SCr of 0.99 mg/dL). []      On intermittent HD, scheduled: []      On CRRT    Antimicrobial dosage has been changed to:  Ancef 1gm IV Q12H x 1 dose  Additional comments:   Thank you for allowing pharmacy to be a part of this patient's care.  Akiva Josey D. Mina Marble, PharmD, BCPS Pager:  [phone] 06/20/2017, 9:43 PM

## 2017-06-20 NOTE — Anesthesia Procedure Notes (Signed)
Procedure Name: MAC Date/Time: 06/20/2017 7:08 PM Performed by: Teressa Lower Pre-anesthesia Checklist: Patient identified, Emergency Drugs available, Suction available, Patient being monitored and Timeout performed Patient Re-evaluated:Patient Re-evaluated prior to induction Oxygen Delivery Method: Simple face mask

## 2017-06-20 NOTE — Transfer of Care (Signed)
Immediate Anesthesia Transfer of Care Note  Patient: Jill Castaneda  Procedure(s) Performed: Procedure(s): ANTERIOR APPROACH HEMI HIP ARTHROPLASTY (Right)  Patient Location: PACU  Anesthesia Type:Spinal  Level of Consciousness: awake, alert  and oriented  Airway & Oxygen Therapy: Patient Spontanous Breathing and Patient connected to nasal cannula oxygen  Post-op Assessment: Report given to RN and Post -op Vital signs reviewed and stable  Post vital signs: Reviewed and stable  Last Vitals:  Vitals:   06/20/17 0459 06/20/17 1300  BP: (!) 141/68 (!) 147/73  Pulse: 65 65  Resp: 16 16  Temp: 37.6 C 36.9 C  SpO2: 92% 92%    Last Pain:  Vitals:   06/20/17 1300  TempSrc: Oral  PainSc:       Patients Stated Pain Goal: 2 (77/41/28 7867)  Complications: No apparent anesthesia complications

## 2017-06-20 NOTE — Op Note (Signed)
OPERATIVE REPORT  SURGEON: Rod Can, MD   ASSISTANT: Ky Barban, RNFA  PREOPERATIVE DIAGNOSIS: Displaced Right femoral neck fracture.   POSTOPERATIVE DIAGNOSIS: Displaced Right femoral neck fracture.   PROCEDURE: Right hip hemiarthroplasty, anterior approach.   IMPLANTS: 1. DePuy Tri Lock stem, size 4, std offset, with a -3 mm spacer and a 45 mm monopolar head ball. 2. Dall-Miles 2.0 mm reconstruction cable x1.  ANESTHESIA:  Spinal  ANTIBIOTICS: 2g ancef.  ESTIMATED BLOOD LOSS: 150 mL.    DRAINS: None.  COMPLICATIONS: None   CONDITION: PACU - hemodynamically stable.   BRIEF CLINICAL NOTE: Jill Castaneda is a 83 y.o. female with a displaced Right femoral neck fracture. The patient was admitted to the hospitalist service and underwent perioperative risk stratification and medical optimization. The risks, benefits, and alternatives to hemiarthroplasty were explained, and the patient elected to proceed.  PROCEDURE IN DETAIL: The patient was taken to the operating room and general anesthesia was induced on the hospital bed. The patient was then positioned on the Hana table. All bony prominences were well padded. The hip was prepped and draped in the normal sterile surgical fashion. A time-out was called verifying side and site of surgery. Antibiotics were given within 60 minutes of beginning the procedure.  The direct anterior approach to the hip was performed through the Hueter interval. Lateral femoral circumflex vessels were treated with the Auqumantys. The anterior capsule was exposed and an inverted T capsulotomy was made. Fracture hematoma was encountered and evacuated. The patient was found to have a comminuted Right subcapital femoral neck fracture. She did have a V shaped crack in the posterior aspect of the calcar. I freshened the femoral neck cut with a saw. I removed the femoral neck fragment. A corkscrew was placed into the head and the head was  removed. This was passed to the back table and was measured.  Acetabular exposure was achieved. I examined the articular cartilage which was intact. The labrum was intact. A 45 mm trial head was placed and found to have excellent fit.  I then gained femoral exposure taking care to protect the abductors and greater trochanter. This was performed using standard external rotation, extension, and adduction. The capsule was peeled off the inner aspect of the greater trochanter, taking care to preserve the short external rotators. I elected to prophylactically cable the femoral neck due to the presence of the calcar crack. I then placed a cable passer subperiosteally around the femoral neck. The cable was passed and subsequently tightened and crimped. A cookie cutter was used to enter the femoral canal, and then the femoral canal finder was used to confirm location. I then sequentially broached up to a size 4. Calcar planer was used on the femoral neck remnant. I paced a standard neck and a 36+ 1.5 head ball.The hip was reduced. Leg lengths were checked fluoroscopically. The hip was dislocated and trial components were removed. I placed the real stem followed by the real spacer and head ball. A single reduction maneuver was performed and the hip was reduced. Fluoroscopy was used to confirm component position and leg lengths. At 90 degrees of external rotation and extension, the hip was stable to an anterior directed force.  The wound was copiously irrigated with normal saline solution. Marcaine solution was injected into the periarticular soft tissue. The wound was closed in layers using #1 Vicryl and V-Loc for the fascia, 2-0 Vicryl for the subcutaneous fat, 2-0 Monocryl for the deep dermal layer, 3-0 running Monocryl  subcuticular stitch and glue for the skin. Once the glue was fully dried, an Aquacell Ag dressing was applied. The patient was then awakened from anesthesia and transported to  the recovery room in stablecondition. Sponge, needle, and instrument counts were correct at the end of the case x2. The patient tolerated the procedure well and there were no known complications.  Postoperatively, we will readmit the patient to the hospitalist. She may weight-bear as tolerated with a walker. We'll place her on aspirin for DVT prophylaxis. She will work with physical therapy. We will plan to discharge her home with home health physical therapy. She does have a granddaughter as well as [place]-speaking caretakers that will assist her. I will see her in the office 2 weeks after discharge.

## 2017-06-20 NOTE — Anesthesia Preprocedure Evaluation (Signed)
Anesthesia Evaluation  Patient identified by MRN, date of birth, ID band Patient awake    Reviewed: Allergy & Precautions, NPO status , Patient's Chart, lab work & pertinent test results  Airway Mallampati: II  TM Distance: >3 FB Neck ROM: Full    Dental no notable dental hx.    Pulmonary neg pulmonary ROS,    Pulmonary exam normal breath sounds clear to auscultation       Cardiovascular hypertension, Pt. on medications + CAD and + Past MI  negative cardio ROS Normal cardiovascular exam Rhythm:Regular Rate:Normal     Neuro/Psych CVA negative neurological ROS  negative psych ROS   GI/Hepatic negative GI ROS, Neg liver ROS,   Endo/Other  negative endocrine ROS  Renal/GU negative Renal ROS  negative genitourinary   Musculoskeletal negative musculoskeletal ROS (+)   Abdominal   Peds negative pediatric ROS (+)  Hematology negative hematology ROS (+)   Anesthesia Other Findings Femoral neck fracture  Reproductive/Obstetrics negative OB ROS                             Anesthesia Physical Anesthesia Plan  ASA: II  Anesthesia Plan: Spinal   Post-op Pain Management:    Induction: Intravenous  PONV Risk Score and Plan: 2 and Ondansetron and Treatment may vary due to age or medical condition  Airway Management Planned: Simple Face Mask  Additional Equipment:   Intra-op Plan:   Post-operative Plan:   Informed Consent: I have reviewed the patients History and Physical, chart, labs and discussed the procedure including the risks, benefits and alternatives for the proposed anesthesia with the patient or authorized representative who has indicated his/her understanding and acceptance.   Dental advisory given  Plan Discussed with: CRNA  Anesthesia Plan Comments:         Anesthesia Quick Evaluation

## 2017-06-20 NOTE — Anesthesia Procedure Notes (Signed)
Spinal  Patient location during procedure: OR Start time: 06/20/2017 6:35 PM End time: 06/20/2017 6:38 PM Staffing Anesthesiologist: Lillia Abed Performed: anesthesiologist  Preanesthetic Checklist Completed: patient identified, surgical consent, pre-op evaluation, timeout performed, IV checked, risks and benefits discussed and monitors and equipment checked Spinal Block Patient position: left lateral decubitus Prep: DuraPrep Patient monitoring: heart rate, cardiac monitor, continuous pulse ox and blood pressure Approach: left paramedian Location: L3-4 Injection technique: single-shot Needle Needle type: Pencan  Needle gauge: 24 G Needle length: 9 cm Needle insertion depth: 5 cm

## 2017-06-20 NOTE — Discharge Instructions (Signed)
°Dr. Chirstine Defrain °Joint Replacement Specialist °[facility] °[address] °[place], [place] [address] °[phone] ° ° °TOTAL HIP REPLACEMENT POSTOPERATIVE DIRECTIONS ° ° ° °Hip Rehabilitation, Guidelines Following Surgery  ° °WEIGHT BEARING °Weight bearing as tolerated with assist device (walker, cane, etc) as directed, use it as long as suggested by your surgeon or therapist, typically at least 4-6 weeks. ° °The results of a hip operation are greatly improved after range of motion and muscle strengthening exercises. Follow all safety measures which are given to protect your hip. If any of these exercises cause increased pain or swelling in your joint, decrease the amount until you are comfortable again. Then slowly increase the exercises. Call your caregiver if you have problems or questions.  ° °HOME CARE INSTRUCTIONS  °Most of the following instructions are designed to prevent the dislocation of your new hip.  °Remove items at home which could result in a fall. This includes throw rugs or furniture in walking pathways.  °Continue medications as instructed at time of discharge. °· You may have some home medications which will be placed on hold until you complete the course of blood thinner medication. °· You may start showering once you are discharged home. Do not remove your dressing. °Do not put on socks or shoes without following the instructions of your caregivers.   °Sit on chairs with arms. Use the chair arms to help push yourself up when arising.  °Arrange for the use of a toilet seat elevator so you are not sitting low.  °· Walk with walker as instructed.  °You may resume a sexual relationship in one month or when given the OK by your caregiver.  °Use walker as long as suggested by your caregivers.  °You may put full weight on your legs and walk as much as is comfortable. °Avoid periods of inactivity such as sitting longer than an hour when not asleep. This helps prevent  blood clots.  °You may return to work once you are cleared by your surgeon.  °Do not drive a car for 6 weeks or until released by your surgeon.  °Do not drive while taking narcotics.  °Wear elastic stockings for two weeks following surgery during the day but you may remove then at night.  °Make sure you keep all of your appointments after your operation with all of your doctors and caregivers. You should call the office at the above phone number and make an appointment for approximately two weeks after the date of your surgery. °Please pick up a stool softener and laxative for home use as long as you are requiring pain medications. °· ICE to the affected hip every three hours for 30 minutes at a time and then as needed for pain and swelling. Continue to use ice on the hip for pain and swelling from surgery. You may notice swelling that will progress down to the foot and ankle.  This is normal after surgery.  Elevate the leg when you are not up walking on it.   °It is important for you to complete the blood thinner medication as prescribed by your doctor. °· Continue to use the breathing machine which will help keep your temperature down.  It is common for your temperature to cycle up and down following surgery, especially at night when you are not up moving around and exerting yourself.  The breathing machine keeps your lungs expanded and your temperature down. ° °RANGE OF MOTION AND STRENGTHENING EXERCISES  °These exercises are   designed to help you keep full movement of your hip joint. Follow your caregiver's or physical therapist's instructions. Perform all exercises about fifteen times, three times per day or as directed. Exercise both hips, even if you have had only one joint replacement. These exercises can be done on a training (exercise) mat, on the floor, on a table or on a bed. Use whatever works the best and is most comfortable for you. Use music or television while you are exercising so that the exercises  are a pleasant break in your day. This will make your life better with the exercises acting as a break in routine you can look forward to.  °Lying on your back, slowly slide your foot toward your buttocks, raising your knee up off the floor. Then slowly slide your foot back down until your leg is straight again.  °Lying on your back spread your legs as far apart as you can without causing discomfort.  °Lying on your side, raise your upper leg and foot straight up from the floor as far as is comfortable. Slowly lower the leg and repeat.  °Lying on your back, tighten up the muscle in the front of your thigh (quadriceps muscles). You can do this by keeping your leg straight and trying to raise your heel off the floor. This helps strengthen the largest muscle supporting your knee.  °Lying on your back, tighten up the muscles of your buttocks both with the legs straight and with the knee bent at a comfortable angle while keeping your heel on the floor.  ° °SKILLED REHAB INSTRUCTIONS: °If the patient is transferred to a skilled rehab facility following release from the hospital, a list of the current medications will be sent to the facility for the patient to continue.  When discharged from the skilled rehab facility, please have the facility set up the patient's Home Health Physical Therapy prior to being released. Also, the skilled facility will be responsible for providing the patient with their medications at time of release from the facility to include their pain medication and their blood thinner medication. If the patient is still at the rehab facility at time of the two week follow up appointment, the skilled rehab facility will also need to assist the patient in arranging follow up appointment in our office and any transportation needs. ° °MAKE SURE YOU:  °Understand these instructions.  °Will watch your condition.  °Will get help right away if you are not doing well or get worse. ° °Pick up stool softner and  laxative for home use following surgery while on pain medications. °Do not remove your dressing. °The dressing is waterproof--it is OK to take showers. °Continue to use ice for pain and swelling after surgery. °Do not use any lotions or creams on the incision until instructed by your surgeon. °Total Hip Protocol. ° ° °

## 2017-06-20 NOTE — Progress Notes (Signed)
[place] video interpreter used to verify surgical informed consent in presence of Dr. Lyla Glassing.  Interpreter ID [id number]. Pt also requests that granddaughter, [name], be used for personal interpreter needs.

## 2017-06-20 NOTE — Progress Notes (Signed)
PROGRESS NOTE  Jill Castaneda QTM:[id number] DOB: [birth year] DOA: 06/19/2017 PCP: Patient, No Pcp Per  HPI/Recap of past 22 hours: 83 year old female with past medical history of CVA, CAD and hypertension, quite physically active, admitted on 8/19 after mechanical fall leading to displaced non-comminuted mid cervical right femoral neck fracture. Evaluated in the emergency room and found to be overall stable. Patient evaluated by orthopedic surgery who planned to take patient to the operating room today, 8/20.  Today, patient doing okay. Complains of some right hip soreness, but otherwise no complaints. Pain relieved with Tylenol. (Patient only speaks [place], Optometrist used)  Assessment/Plan: Active Problems:   Hip fracture ([place]): Stable. For operating room today.  Monitor for acute blood loss anemia after surgery Hypertension essential: Home medications continued. Blood pressure stable CAD: Stable Thrombocytosis:? Stress margination following fall. Follow numbers.  Code Status: Full code   Family Communication: Daughter at the bedside   Disposition Plan: Following surgery, plans for patient to go home. Patient's daughter is a physical therapist    Consultants:  Orthopedic surgery   Procedures:  Planned right hip repair   Antimicrobials:  IV Ancef for surgery   DVT prophylaxis:  SCDs   Objective: Vitals:   06/19/17 1650 06/19/17 2225 06/20/17 0459 06/20/17 1300  BP: (!) 152/78 132/61 (!) 141/68 (!) 147/73  Pulse: 71 67 65 65  Resp: 18 16 16 16   Temp: 98.4 F (36.9 C) 99.2 F (37.3 C) 99.7 F (37.6 C) 98.5 F (36.9 C)  TempSrc: Oral Oral Oral Oral  SpO2: 93% 93% 92% 92%  Weight:      Height:        Intake/Output Summary (Last 24 hours) at 06/20/17 1438 Last data filed at 06/20/17 1300  Gross per 24 hour  Intake           1332.5 ml  Output                0 ml  Net           1332.5 ml   Filed Weights   06/19/17 1047  Weight: 42.2 kg (93 lb)     Exam:   General:  Alert and oriented 3, no acute distress   Cardiovascular: Regular rate and rhythm, S1-S2   Respiratory: Clear to auscultation bilaterally   Abdomen: Soft, nontender, nondistended, positive bowel sounds   Musculoskeletal: No clubbing or cyanosis or edema   Skin: Bruising on right lower extremity  Psychiatry: Patient appropriate, no evidence of psychoses    Data Reviewed: CBC:  Recent Labs Lab 06/19/17 1119 06/20/17 0412  WBC 9.4 9.1  NEUTROABS 7.9*  --   HGB 15.3* 13.6  HCT 47.0* 41.7  MCV 84.1 82.6  PLT 795* 625*   Basic Metabolic Panel:  Recent Labs Lab 06/19/17 1119 06/20/17 0412  NA 139 137  K 3.8 3.5  CL 105 107  CO2 25 23  GLUCOSE 89 102*  BUN 19 17  CREATININE 0.99 0.99  CALCIUM 9.0 8.0*   GFR: Estimated Creatinine Clearance: 24.4 mL/min (by C-G formula based on SCr of 0.99 mg/dL). Liver Function Tests: No results for input(s): AST, ALT, ALKPHOS, BILITOT, PROT, ALBUMIN in the last 168 hours. No results for input(s): LIPASE, AMYLASE in the last 168 hours. No results for input(s): AMMONIA in the last 168 hours. Coagulation Profile:  Recent Labs Lab 06/19/17 1119  INR 0.96   Cardiac Enzymes: No results for input(s): CKTOTAL, CKMB, CKMBINDEX, TROPONINI in the last 168 hours. BNP (last  3 results) No results for input(s): PROBNP in the last 8760 hours. HbA1C: No results for input(s): HGBA1C in the last 72 hours. CBG: No results for input(s): GLUCAP in the last 168 hours. Lipid Profile: No results for input(s): CHOL, HDL, LDLCALC, TRIG, CHOLHDL, LDLDIRECT in the last 72 hours. Thyroid Function Tests: No results for input(s): TSH, T4TOTAL, FREET4, T3FREE, THYROIDAB in the last 72 hours. Anemia Panel: No results for input(s): VITAMINB12, FOLATE, FERRITIN, TIBC, IRON, RETICCTPCT in the last 72 hours. Urine analysis:    Component Value Date/Time   COLORURINE YELLOW 06/19/2017 1110   APPEARANCEUR CLEAR 06/19/2017 1110    LABSPEC 1.011 06/19/2017 1110   PHURINE 7.0 06/19/2017 1110   GLUCOSEU NEGATIVE 06/19/2017 1110   HGBUR NEGATIVE 06/19/2017 [place] 06/19/2017 1110   KETONESUR NEGATIVE 06/19/2017 1110   PROTEINUR NEGATIVE 06/19/2017 1110   NITRITE NEGATIVE 06/19/2017 1110   LEUKOCYTESUR NEGATIVE 06/19/2017 1110   Sepsis Labs: @LABRCNTIP (procalcitonin:4,lacticidven:4)  ) Recent Results (from the past 240 hour(s))  Surgical pcr screen     Status: None   Collection Time: 06/19/17 11:34 PM  Result Value Ref Range Status   MRSA, PCR NEGATIVE NEGATIVE Final   Staphylococcus aureus NEGATIVE NEGATIVE Final    Comment:        The Xpert SA Assay (FDA approved for NASAL specimens in patients over 54 years of age), is one component of a comprehensive surveillance program.  Test performance has been validated by [facility] for patients greater than or equal to 47 year old. It is not intended to diagnose infection nor to guide or monitor treatment.       Studies: No results found.  Scheduled Meds: . amLODipine  2.5 mg Oral Daily  . chlorhexidine  60 mL Topical Once  . chlorhexidine  60 mL Topical Once  . omega-3 acid ethyl esters  1 g Oral Daily  . povidone-iodine  2 application Topical Once  . povidone-iodine  2 application Topical Once    Continuous Infusions: . sodium chloride 75 mL/hr at 06/20/17 0750  . acetaminophen    . [START ON 06/21/2017]  ceFAZolin (ANCEF) IV    . tranexamic acid       LOS: 1 day     Annita Brod, MD [facility] Pager [phone]  If 7PM-7AM, please contact night-coverage [email or web address] Password [facility] 06/20/2017, 2:38 PM

## 2017-06-20 NOTE — Consult Note (Signed)
ORTHOPAEDIC CONSULTATION  REQUESTING PHYSICIAN: Annita Brod, MD  PCP:  Patient, No Pcp Per  Chief Complaint: Displaced right femoral neck fracture  HPI: Jill Castaneda is a 83 y.o. female who complains of some right hip pain after a ground-level fall yesterday. She had right hip pain and inability to weight-bear. She was brought to the emergency department, where x-rays revealed a displaced right femoral neck fracture. She was admitted to the hospitalist service and underwent perioperative risk stratification and medical optimization. She denies other injuries.   The patient is a [place]. She lives with her granddaughter, who is a physical therapist, and has Russian-speaking caretakers. She has a history of left hip hemiarthroplasty done in [place] about 13 years ago.  Past Medical History:  Diagnosis Date  . Cataract   . Complication of anesthesia    Difficulty waking up "  . Coronary artery disease   . Hip fracture ([place]) 06/2017   right hip  . Hypertension   . Osteoporosis   . Stroke [facility])    Past Surgical History:  Procedure Laterality Date  . FRACTURE SURGERY    . SHOULDER HARDWARE REMOVAL Left   . SHOULDER HEMI-ARTHROPLASTY Left   . TOTAL HIP ARTHROPLASTY Right    Social History   Social History  . Marital status: Divorced    Spouse name: N/A  . Number of children: 1  . Years of education: masters   Occupational History  . retired    Social History Main Topics  . Smoking status: Never Smoker  . Smokeless tobacco: Never Used  . Alcohol use No  . Drug use: No  . Sexual activity: No   Other Topics Concern  . None   Social History Narrative   Patient lives with granddaughter, patient drinks caffeine daily.   Family History  Problem Relation Age of Onset  . Hypertension Father   . Heart disease Father   . Hypertension Sister   . Heart disease Sister   . Stroke Mother   . Cancer Brother    No Known Allergies Prior to Admission  medications   Medication Sig Start Date End Date Taking? Authorizing Provider  amLODipine (NORVASC) 2.5 MG tablet TAKE ONE TABLET BY MOUTH ONCE DAILY 02/22/17  Yes Jacqulynn Cadet, [place], PA-C  aspirin EC 81 MG tablet Take 1 tablet (81 mg total) by mouth daily. 03/09/14  Yes Thurnell Lose, MD  Multiple Vitamin (MULTIVITAMIN WITH MINERALS) TABS tablet Take 1 tablet by mouth once a week.    Yes [provider]  omega-3 acid ethyl esters (LOVAZA) 1 g capsule Take 1 g by mouth daily.   Yes [provider]   Dg Chest 1 View  Result Date: 06/19/2017 CLINICAL DATA:  Pt fell this AM at her home. Pt having right side hip pain from fall. Preop exam. Takes HTN meds and has a hx of having a stroke. EXAM: CHEST 1 VIEW COMPARISON:  04/12/2014 FINDINGS: Cardiac silhouette is normal in size. No mediastinal or hilar masses or evidence of adenopathy. Clear lungs. No pleural effusion or pneumothorax. Skeletal structures are diffusely demineralized but grossly intact. IMPRESSION: No acute cardiopulmonary disease. Electronically Signed   By: Lajean Manes M.D.   On: 06/19/2017 11:59   Dg Hip Unilat With Pelvis 2-3 Views Right  Result Date: 06/19/2017 CLINICAL DATA:  Pt fell this AM at her home. Pt having right side hip pain from fall. EXAM: DG HIP (WITH OR WITHOUT PELVIS) 2-3V RIGHT COMPARISON:  None. FINDINGS:  There is a displaced fracture of the right femoral neck, mid cervical. The distal fracture component has displaced superiorly by 13 mm. There is mild varus angulation. No comminution. No other fractures.  No dislocation. Left hip arthroplasty appears well-seated and well-aligned. SI joints and symphysis pubis are normally aligned. Bones are demineralized. IMPRESSION: Displaced, non comminuted, mid cervical right femoral neck fracture. Mild varus angulation. Electronically Signed   By: Lajean Manes M.D.   On: 06/19/2017 12:00   Dg Femur [place], [place] 2 Views Right  Result Date: 06/19/2017 CLINICAL DATA:   Fracture right hip. EXAM: RIGHT FEMUR PORTABLE 2 VIEW COMPARISON:  None. FINDINGS: The femur was imaged from below the hip through the knee. No additional fractures identified on this study. IMPRESSION: No fractures through the mid or distal femur. Electronically Signed   By: Dorise Bullion III M.D   On: 06/19/2017 13:10    Positive ROS: All other systems have been reviewed and were otherwise negative with the exception of those mentioned in the HPI and as above.  Physical Exam: General: Alert, no acute distress Cardiovascular: No pedal edema Respiratory: No cyanosis, no use of accessory musculature GI: No organomegaly, abdomen is soft and non-tender Skin: No lesions in the area of chief complaint Neurologic: Sensation intact distally Psychiatric: Patient is competent for consent with normal mood and affect Lymphatic: No axillary or cervical lymphadenopathy  MUSCULOSKELETAL: Examination the right lower extremity reveals no skin wounds or lesions. The extremity is shortened and rotated. She has pain with logrolling of the hip. She is neurovascularly intact.  Assessment: Displaced right femoral neck fracture  Plan: I discussed the findings with the patient and her granddaughter. We used a [place]. I recommended right hip hemiarthroplasty for early mobilization and pain control. We discussed the risks, benefits, and alternatives. We'll plan for surgery today. All questions solicited and answered.  The risks, benefits, and alternatives were discussed with the patient. There are risks associated with the surgery including, but not limited to, problems with anesthesia (death), infection, instability (giving out of the joint), dislocation, differences in leg length/angulation/rotation, fracture of bones, loosening or failure of implants, hematoma (blood accumulation) which may require surgical drainage, blood clots, pulmonary embolism, nerve injury (foot drop and lateral thigh numbness),  and blood vessel injury. The patient understands these risks and elects to proceed.   Deval Mroczka, Horald Pollen, MD Cell [phone]    06/20/2017 6:01 PM

## 2017-06-20 NOTE — Anesthesia Postprocedure Evaluation (Signed)
Anesthesia Post Note  Patient: Jill Castaneda  Procedure(s) Performed: Procedure(s) (LRB): ANTERIOR APPROACH HEMI HIP ARTHROPLASTY (Right)     Patient location during evaluation: PACU Anesthesia Type: Spinal Level of consciousness: oriented and awake and alert Pain management: pain level controlled Vital Signs Assessment: post-procedure vital signs reviewed and stable Respiratory status: spontaneous breathing, respiratory function stable and patient connected to nasal cannula oxygen Cardiovascular status: blood pressure returned to baseline and stable Postop Assessment: no headache and no backache Anesthetic complications: no    Last Vitals:  Vitals:   06/20/17 2134 06/20/17 2245  BP: 96/72 104/60  Pulse:    Resp:  16  Temp: (!) 36.4 C 36.6 C  SpO2: 90% 92%    Last Pain:  Vitals:   06/20/17 2245  TempSrc: Oral  PainSc:                  Davette Nugent DAVID

## 2017-06-21 DIAGNOSIS — E876 Hypokalemia: Secondary | ICD-10-CM

## 2017-06-21 DIAGNOSIS — S72001A Fracture of unspecified part of neck of right femur, initial encounter for closed fracture: Secondary | ICD-10-CM

## 2017-06-21 DIAGNOSIS — I10 Essential (primary) hypertension: Secondary | ICD-10-CM

## 2017-06-21 DIAGNOSIS — D62 Acute posthemorrhagic anemia: Secondary | ICD-10-CM

## 2017-06-21 LAB — BASIC METABOLIC PANEL
Anion gap: 5 (ref 5–15)
BUN: 20 mg/dL (ref 6–20)
CHLORIDE: 110 mmol/L (ref 101–111)
CO2: 24 mmol/L (ref 22–32)
CREATININE: 1.17 mg/dL — AB (ref 0.44–1.00)
Calcium: 7.7 mg/dL — ABNORMAL LOW (ref 8.9–10.3)
GFR calc Af Amer: 47 mL/min — ABNORMAL LOW (ref >60)
GFR calc non Af Amer: 41 mL/min — ABNORMAL LOW (ref >60)
Glucose, Bld: 123 mg/dL — ABNORMAL HIGH (ref 65–99)
Potassium: 3.4 mmol/L — ABNORMAL LOW (ref 3.5–5.1)
SODIUM: 139 mmol/L (ref 135–145)

## 2017-06-21 LAB — CBC
HCT: 37.9 % (ref 36.0–46.0)
HEMOGLOBIN: 11.8 g/dL — AB (ref 12.0–15.0)
MCH: 26.3 pg (ref 26.0–34.0)
MCHC: 31.1 g/dL (ref 30.0–36.0)
MCV: 84.4 fL (ref 78.0–100.0)
Platelets: 568 10*3/uL — ABNORMAL HIGH (ref 150–400)
RBC: 4.49 MIL/uL (ref 3.87–5.11)
RDW: 14.7 % (ref 11.5–15.5)
WBC: 11 10*3/uL — ABNORMAL HIGH (ref 4.0–10.5)

## 2017-06-21 MED ORDER — HYDROCODONE-ACETAMINOPHEN 5-325 MG PO TABS: 1.0000 | ORAL_TABLET | Freq: Four times a day (QID) | ORAL | 0 refills | Status: DC | PRN

## 2017-06-21 MED ORDER — POTASSIUM CHLORIDE CRYS ER 20 MEQ PO TBCR
40.0000 meq | EXTENDED_RELEASE_TABLET | Freq: Once | ORAL | Status: AC
Start: 2017-06-21 — End: 2017-06-21
  Administered 2017-06-21: 40 meq via ORAL
  Filled 2017-06-21: qty 2

## 2017-06-21 MED ORDER — ASPIRIN 81 MG PO CHEW: 81.0000 mg | CHEWABLE_TABLET | Freq: Two times a day (BID) | ORAL | 1 refills | Status: DC

## 2017-06-21 NOTE — Evaluation (Signed)
Occupational Therapy Evaluation Patient Details Name: Jill Castaneda MRN: [id number] DOB: 1928-02-27 Today's Date: 06/21/2017    History of Present Illness 83 y.o.femalewith medical history significant of stroke, CAD, hypertension, admitted with fall and right hip fracture patient has a displaced noncommunited mid cervical right femoral neck fracture with mild varus angulation. s/p R hip hemiarthroplasty with anterior approach on 06/20/17.    Clinical Impression   PTA, pt was living with her granddaughter and was independent in her BADLs and simple cooking tasks. Currently, pt requires Min A for LB ADLs and functional mobility using RW. Provided education on LB ADLs, toilet transfer with 3N1, and low vision compensatory techniques. Recommend dc home with 24 hour initial assistance once medically stable per physician. Will follow acutely to facilitate safe dc and increase safety and independence with ADLs and functional mobility.    Follow Up Recommendations  No OT follow up;Supervision/Assistance - 24 hour    Equipment Recommendations  None recommended by OT    Recommendations for Other Services       Precautions / Restrictions Precautions Precautions: Fall Restrictions Weight Bearing Restrictions: Yes      Mobility Bed Mobility Overal bed mobility: Needs Assistance Bed Mobility: Supine to Sit     Supine to sit: Min assist;+2 for safety/equipment;HOB elevated     General bed mobility comments: Min A to elevate trunk and transition hips  Transfers Overall transfer level: Needs assistance Equipment used: Rolling walker (2 wheeled) Transfers: Sit to/from Stand Sit to Stand: Min assist         General transfer comment: Min A to steady in standing. VCs for hand placement and weight shift.     Balance Overall balance assessment: Needs assistance Sitting-balance support: Feet supported;No upper extremity supported Sitting balance-Leahy Scale: Good     Standing balance  support: Bilateral upper extremity supported;During functional activity Standing balance-Leahy Scale: Poor Standing balance comment: Reliant on UE support                           ADL either performed or assessed with clinical judgement   ADL Overall ADL's : Needs assistance/impaired Eating/Feeding: Set up;Sitting   Grooming: Set up;Sitting   Upper Body Bathing: Min guard;Sitting   Lower Body Bathing: Minimal assistance;Sit to/from stand   Upper Body Dressing : Min guard;Sitting   Lower Body Dressing: Minimal assistance;Sit to/from stand Lower Body Dressing Details (indicate cue type and reason): Pt able to reach down and adjust socks. Provided family with education on LB dressing technique; famiyl verbalized understnding Toilet Transfer: BSC;RW;Minimal Print production planner Details (indicate cue type and reason): Pt performed toielt transfer to [facility]. Recommended family use BSC over toilet at home inititally.          Functional mobility during ADLs: Minimal assistance;Rolling walker General ADL Comments: Pt demonstrating good functional performed post surgery. Provided education on LB ADLs, toilet transfers, and low vision compensatory techqniues such as lining stairs with bright tape.      Vision Baseline Vision/History: Cataracts (Cataracts removed; diplobia after CVA) Patient Visual Report: No change from baseline;Other (comment) (Residual diplopia from CVA)       Perception     Praxis      Pertinent Vitals/Pain Pain Assessment: 0-10 Pain Score: 0-No pain Pain Intervention(s): Monitored during session     Hand Dominance Right   Extremity/Trunk Assessment Upper Extremity Assessment Upper Extremity Assessment: Overall WFL for tasks assessed   Lower Extremity Assessment Lower Extremity Assessment: Defer  to PT evaluation   Cervical / Trunk Assessment Cervical / Trunk Assessment: Kyphotic   Communication Communication Communication: Prefers  language other than English;Other (comment) (Granddaughter signed paperwork for interpreting)   Cognition Arousal/Alertness: Awake/alert Behavior During Therapy: WFL for tasks assessed/performed Overall Cognitive Status: Within Functional Limits for tasks assessed                                     General Comments  Pt granddaughter present throughout session to recieve education and act as interpreter    Exercises     Shoulder Instructions      Home Living Family/patient expects to be discharged to:: Private residence Living Arrangements: Other (Comment) (Granddaughter) Available Help at Discharge: Family;Available 24 hours/day Type of Home: House Home Access: Stairs to enter [employer] of Steps: 4 Entrance Stairs-Rails: Right;Left Home Layout: Two level;Able to live on main level with bedroom/bathroom     Bathroom Shower/Tub: Tub/shower unit (Upstairs - supervised for bathing)   Bathroom Toilet: Standard Bathroom Accessibility: Yes How Accessible: Accessible via walker Home Equipment: [place] - 2 wheels;Bedside commode;Tub bench;Wheelchair - manual   Additional Comments: Lives with her granddaughter (who works at a PT)      Prior Functioning/Environment Level of Independence: Needs Product/process development scientist / Transfers Assistance Needed: Refuses to use DME ADL's / Homemaking Assistance Needed: Performs ADLs and performs spoinge bath at sink; supervised for tub shower once a week with family. Pt also performs simple cooking tasks.    Comments: Sponge bath at sink with supervision; goes once a week for bathing upstairs at tub        OT Problem List: Decreased range of motion;Decreased activity tolerance;Impaired balance (sitting and/or standing);Decreased safety awareness;Decreased knowledge of use of DME or AE;Decreased knowledge of precautions;Pain      OT Treatment/Interventions: Therapeutic exercise;Self-care/ADL training;Energy conservation;DME  and/or AE instruction;Therapeutic activities;Patient/family education    OT Goals(Current goals can be found in the care plan section) Acute Rehab OT Goals Patient Stated Goal: Go home OT Goal Formulation: With patient Time For Goal Achievement: 07/05/17 Potential to Achieve Goals: Good ADL Goals Pt Will Perform Lower Body Dressing: with min guard assist;sit to/from stand Pt Will Transfer to Toilet: with min guard assist;bedside commode;ambulating Pt Will Perform Toileting - Clothing Manipulation and hygiene: with min guard assist;sit to/from stand  OT Frequency: Min 2X/week   Barriers to D/C:            Co-evaluation PT/OT/SLP Co-Evaluation/Treatment: Yes Reason for Co-Treatment: For patient/therapist safety;To address functional/ADL transfers   OT goals addressed during session: ADL's and self-care      AM-PAC PT "6 Clicks" Daily Activity     Outcome Measure Help from another person eating meals?: None Help from another person taking care of personal grooming?: A Little Help from another person toileting, which includes using toliet, bedpan, or urinal?: A Little Help from another person bathing (including washing, rinsing, drying)?: A Little Help from another person to put on and taking off regular upper body clothing?: A Little Help from another person to put on and taking off regular lower body clothing?: A Little 6 Click Score: 19   End of Session Equipment Utilized During Treatment: Gait belt;Rolling walker Nurse Communication: Mobility status;Weight bearing status  Activity Tolerance: Patient tolerated treatment well Patient left: in chair;with call bell/phone within reach;with family/visitor present  OT Visit Diagnosis: Unsteadiness on feet (R26.81);Other abnormalities of gait and mobility (  R26.89);Muscle weakness (generalized) (M62.81);Pain;History of falling (Z91.81) Pain - Right/Left: Right Pain - part of body: Hip;Leg                Time: 9935-7017 OT Time  Calculation (min): 32 min Charges:  OT General Charges $OT Visit: 1 Procedure OT Evaluation $OT Eval Moderate Complexity: 1 Procedure G-Codes:     [place], OTR/L Acute Rehab Pager: [phone] Office: [place] 06/21/2017, 12:43 PM

## 2017-06-21 NOTE — Progress Notes (Addendum)
PROGRESS NOTE                                                                                                                                                                                                             Patient Demographics:    Jill Castaneda, is a 83 y.o. female, DOB - 11-01-1928, KKX:[id number]  Admit date - 06/19/2017   Admitting Physician Georgette Shell, MD  Outpatient Primary MD for the patient is Patient, No Pcp Per  LOS - 2  Outpatient Specialists:None  Chief Complaint  Patient presents with  . Fall  . Hip Pain       Brief Narrative   83 year old active female with history of CVA, CAD, hypertension who was admitted with a mechanical fall with a displaced right femoral neck fracture. Patient underwent right hip hemiarthroplasty on 8/20.   Subjective:   Patient reports some pain over the right hip. She wants to get up and do physical therapy.  Assessment  & Plan :    Principal Problem:  Displaced right femoral neck fracture (HCC) Status post right hip hemiarthroplasty on 8/20. Tolerated well. Up with therapy. Pain control with Vicodin when necessary. Robaxin when necessary for muscle spasms. Added bowel regimen. -Possibly home with PT tomorrow. (Granddaughter is a physical therapist and can provide 24-hour supervision at home along with therapy).  Active Problems: Essential hypertension Stable. Continue amlodipine.  Coronary artery disease / history of stroke Stable. Continue aspirin.   Hypokalemia Replenish  Acute postoperative blood loss anemia Mild drop in H&H. Monitor in  a.m.  Code Status : Full code  Family Communication  : Granddaughter at bedside (patient speaks russian and she interpreted for her)  Disposition Plan  : Home tomorrow with home health  Barriers For Discharge : Postop  Consults  :  [facility]  Procedures  : Right hip  hemiarthroplasty  DVT Prophylaxis  :  Lovenox -   Lab Results  Component Value Date   PLT 568 (H) 06/21/2017    Antibiotics  :  Anti-infectives    Start     Dose/Rate Route Frequency Ordered Stop   06/21/17 0630  ceFAZolin (ANCEF) IVPB 1 g/50 mL premix     1 g 100 mL/hr over 30 Minutes Intravenous Every 12 hours 06/20/17 2140 06/21/17 0634   06/21/17 0600  ceFAZolin (ANCEF)  IVPB 2g/100 mL premix  Status:  Discontinued     2 g 200 mL/hr over 30 Minutes Intravenous To Surgery 06/20/17 1241 06/20/17 1249   06/21/17 0600  ceFAZolin (ANCEF) IVPB 2g/100 mL premix     2 g 200 mL/hr over 30 Minutes Intravenous On call to O.R. 06/20/17 1240 06/20/17 1850        Objective:   Vitals:   06/20/17 2245 06/21/17 0000 06/21/17 0606 06/21/17 0900  BP: 104/60 (!) 106/57 101/64 100/63  Pulse:   62   Resp: 16 16 16    Temp: 97.8 F (36.6 C) (!) 97.2 F (36.2 C) (!) 97.4 F (36.3 C)   TempSrc: Oral Oral Axillary   SpO2: 92% 92% 92%   Weight:      Height:        Wt Readings from Last 3 Encounters:  06/19/17 42.2 kg (93 lb)  01/08/16 43.6 kg (96 lb 3.2 oz)  08/18/14 44.9 kg (99 lb)     Intake/Output Summary (Last 24 hours) at 06/21/17 0956 Last data filed at 06/21/17 7673  Gross per 24 hour  Intake          2108.75 ml  Output              150 ml  Net          1958.75 ml     Physical Exam  Gen: not in distress HEENT:  moist mucosa, supple neck Chest: clear b/l, no added sounds CVS: N S1&S2, no murmurs,  GI: soft, NT, ND,  Musculoskeletal: warm,Clean dressing over her right hip     Data Review:    CBC  Recent Labs Lab 06/19/17 1119 06/20/17 0412 06/21/17 0315  WBC 9.4 9.1 11.0*  HGB 15.3* 13.6 11.8*  HCT 47.0* 41.7 37.9  PLT 795* 697* 568*  MCV 84.1 82.6 84.4  MCH 27.4 26.9 26.3  MCHC 32.6 32.6 31.1  RDW 15.0 14.7 14.7  LYMPHSABS 0.9  --   --   MONOABS 0.5  --   --   EOSABS 0.1  --   --   BASOSABS 0.1  --   --     Chemistries   Recent Labs Lab  06/19/17 1119 06/20/17 0412 06/21/17 0315  NA 139 137 139  K 3.8 3.5 3.4*  CL 105 107 110  CO2 25 23 24   GLUCOSE 89 102* 123*  BUN 19 17 20   CREATININE 0.99 0.99 1.17*  CALCIUM 9.0 8.0* 7.7*   ------------------------------------------------------------------------------------------------------------------ No results for input(s): CHOL, HDL, LDLCALC, TRIG, CHOLHDL, LDLDIRECT in the last 72 hours.  Lab Results  Component Value Date   HGBA1C 6.4 (H) 03/09/2014   ------------------------------------------------------------------------------------------------------------------ No results for input(s): TSH, T4TOTAL, T3FREE, THYROIDAB in the last 72 hours.  Invalid input(s): FREET3 ------------------------------------------------------------------------------------------------------------------ No results for input(s): VITAMINB12, FOLATE, FERRITIN, TIBC, IRON, RETICCTPCT in the last 72 hours.  Coagulation profile  Recent Labs Lab 06/19/17 1119  INR 0.96    No results for input(s): DDIMER in the last 72 hours.  Cardiac Enzymes No results for input(s): CKMB, TROPONINI, MYOGLOBIN in the last 168 hours.  Invalid input(s): CK ------------------------------------------------------------------------------------------------------------------ No results found for: BNP  Inpatient Medications  Scheduled Meds: . amLODipine  2.5 mg Oral Daily  . aspirin  81 mg Oral BID WC  . docusate sodium  100 mg Oral BID  . omega-3 acid ethyl esters  1 g Oral Daily  . senna  1 tablet Oral BID   Continuous Infusions: . sodium chloride  75 mL/hr at 06/20/17 2158  . sodium chloride    . acetaminophen    . methocarbamol (ROBAXIN)  IV     PRN Meds:.acetaminophen, HYDROcodone-acetaminophen, menthol-cetylpyridinium **OR** phenol, methocarbamol **OR** methocarbamol (ROBAXIN)  IV, metoCLOPramide **OR** metoCLOPramide (REGLAN) injection, morphine injection, ondansetron **OR** ondansetron (ZOFRAN) IV,  polyethylene glycol  Micro Results Recent Results (from the past 240 hour(s))  Surgical pcr screen     Status: None   Collection Time: 06/19/17 11:34 PM  Result Value Ref Range Status   MRSA, PCR NEGATIVE NEGATIVE Final   Staphylococcus aureus NEGATIVE NEGATIVE Final    Comment:        The Xpert SA Assay (FDA approved for NASAL specimens in patients over 77 years of age), is one component of a comprehensive surveillance program.  Test performance has been validated by [facility] for patients greater than or equal to 8 year old. It is not intended to diagnose infection nor to guide or monitor treatment.     Radiology Reports Dg Chest 1 View  Result Date: 06/19/2017 CLINICAL DATA:  Pt fell this AM at her home. Pt having right side hip pain from fall. Preop exam. Takes HTN meds and has a hx of having a stroke. EXAM: CHEST 1 VIEW COMPARISON:  04/12/2014 FINDINGS: Cardiac silhouette is normal in size. No mediastinal or hilar masses or evidence of adenopathy. Clear lungs. No pleural effusion or pneumothorax. Skeletal structures are diffusely demineralized but grossly intact. IMPRESSION: No acute cardiopulmonary disease. Electronically Signed   By: Lajean Manes M.D.   On: 06/19/2017 11:59   Pelvis Portable  Result Date: 06/20/2017 CLINICAL DATA:  Right hip replacement. EXAM: PORTABLE PELVIS 1-2 VIEWS COMPARISON:  None. FINDINGS: The patient is status post right hip replacement. Surgical hardware in the cerclage wire are in good position. Postoperative air seen in the soft tissues. The patient is status post previa replacement of the left hip as well. IMPRESSION: Acute right hip replacement as above. Electronically Signed   By: Dorise Bullion III M.D   On: 06/20/2017 20:56   Dg C-arm 61-120 Min  Result Date: 06/20/2017 CLINICAL DATA:  Intraoperative fluoroscopy during right hip replacement. FLUOROSCOPY TIME:  10 seconds. Images: 3 EXAM: OPERATIVE RIGHT HIP (WITH PELVIS IF PERFORMED)   VIEWS TECHNIQUE: Fluoroscopic spot image(s) were submitted for interpretation post-operatively. COMPARISON:  None. FINDINGS: Three images were obtained during right hip replacement. The acetabular and femoral components are in good position by the end of the study. IMPRESSION: Right hip replacement as above. Electronically Signed   By: Dorise Bullion III M.D   On: 06/20/2017 20:56   Dg Hip Operative Unilat W Or W/o Pelvis Right  Result Date: 06/20/2017 CLINICAL DATA:  Intraoperative fluoroscopy during right hip replacement. FLUOROSCOPY TIME:  10 seconds. Images: 3 EXAM: OPERATIVE RIGHT HIP (WITH PELVIS IF PERFORMED)  VIEWS TECHNIQUE: Fluoroscopic spot image(s) were submitted for interpretation post-operatively. COMPARISON:  None. FINDINGS: Three images were obtained during right hip replacement. The acetabular and femoral components are in good position by the end of the study. IMPRESSION: Right hip replacement as above. Electronically Signed   By: Dorise Bullion III M.D   On: 06/20/2017 20:56   Dg Hip Unilat With Pelvis 2-3 Views Right  Result Date: 06/19/2017 CLINICAL DATA:  Pt fell this AM at her home. Pt having right side hip pain from fall. EXAM: DG HIP (WITH OR WITHOUT PELVIS) 2-3V RIGHT COMPARISON:  None. FINDINGS: There is a displaced fracture of the right femoral neck, mid  cervical. The distal fracture component has displaced superiorly by 13 mm. There is mild varus angulation. No comminution. No other fractures.  No dislocation. Left hip arthroplasty appears well-seated and well-aligned. SI joints and symphysis pubis are normally aligned. Bones are demineralized. IMPRESSION: Displaced, non comminuted, mid cervical right femoral neck fracture. Mild varus angulation. Electronically Signed   By: Lajean Manes M.D.   On: 06/19/2017 12:00   Dg Femur [place], [place] 2 Views Right  Result Date: 06/19/2017 CLINICAL DATA:  Fracture right hip. EXAM: RIGHT FEMUR PORTABLE 2 VIEW COMPARISON:  None. FINDINGS:  The femur was imaged from below the hip through the knee. No additional fractures identified on this study. IMPRESSION: No fractures through the mid or distal femur. Electronically Signed   By: Dorise Bullion III M.D   On: 06/19/2017 13:10    Time Spent in minutes  25   Louellen Molder M.D on 06/21/2017 at 9:56 AM  Between 7am to 7pm - Pager - [phone]  After 7pm go to [email or web address] - password [facility]  [facility] -  Office  [phone]

## 2017-06-21 NOTE — Progress Notes (Signed)
Initial Nutrition Assessment  DOCUMENTATION CODES:   Not applicable  INTERVENTION:  Encourage adequate PO intake.   Family encouraged to bring in food from home/outside that pt prefers to eat.   NUTRITION DIAGNOSIS:   Increased nutrient needs related to  (post op healing) as evidenced by estimated needs.  GOAL:   Patient will meet greater than or equal to 90% of their needs  MONITOR:   PO intake, Labs, Weight trends, Skin, I & O's  REASON FOR ASSESSMENT:   Consult Assessment of nutrition requirement/status  ASSESSMENT:   83 y.o. female with medical history significant of stroke, CAD, hypertension, admitted with fall and right hip fracture patient has a displaced noncommunited mid cervical right femoral neck fracture with mild varus angulation.   PROCEDURE (8/20): Right hip hemiarthroplasty, anterior approach.   Family at bedside. They reports pt has been eating well currently and PTA with no other difficulties. Family report pt dislikes hospital food thus family has been bringing in food that pt prefers to eat. Weight has been stable. Unable to complete Nutrition-Focused physical exam at this time as pt was busy during time of visit.   Labs and medications reviewed.   Diet Order:  Diet regular Room service appropriate? Yes; Fluid consistency: Thin  Skin:   (Incision R hip)  Last BM:  8/18  Height:   Ht Readings from Last 1 Encounters:  06/19/17 4\' 9"  (1.448 m)    Weight:   Wt Readings from Last 1 Encounters:  06/19/17 93 lb (42.2 kg)    Ideal Body Weight:  43 kg  BMI:  Body mass index is 20.13 kg/m.  Estimated Nutritional Needs:   Kcal:  1200-1500   Protein:  50-60 grams  Fluid:  >/= 1.5 L/day  EDUCATION NEEDS:   No education needs identified at this time  Corrin Parker, MS, RD, LDN Pager # [phone] After hours/ weekend pager # [phone]

## 2017-06-21 NOTE — Progress Notes (Signed)
   Subjective:  Patient reports pain as mild.  No c/o.  Objective:   VITALS:   Vitals:   06/20/17 2245 06/21/17 0000 06/21/17 0606 06/21/17 0900  BP: 104/60 (!) 106/57 101/64 100/63  Pulse:   62   Resp: 16 16 16    Temp: 97.8 F (36.6 C) (!) 97.2 F (36.2 C) (!) 97.4 F (36.3 C)   TempSrc: Oral Oral Axillary   SpO2: 92% 92% 92%   Weight:      Height:        NAD ABD soft Sensation intact distally Intact pulses distally Dorsiflexion/Plantar flexion intact Incision: dressing C/D/I Compartment soft   Lab Results  Component Value Date   WBC 11.0 (H) 06/21/2017   HGB 11.8 (L) 06/21/2017   HCT 37.9 06/21/2017   MCV 84.4 06/21/2017   PLT 568 (H) 06/21/2017   BMET    Component Value Date/Time   NA 139 06/21/2017 0315   K 3.4 (L) 06/21/2017 0315   CL 110 06/21/2017 0315   CO2 24 06/21/2017 0315   GLUCOSE 123 (H) 06/21/2017 0315   BUN 20 06/21/2017 0315   CREATININE 1.17 (H) 06/21/2017 0315   CALCIUM 7.7 (L) 06/21/2017 0315   GFRNONAA 41 (L) 06/21/2017 0315   GFRAA 47 (L) 06/21/2017 0315     Assessment/Plan: 1 Day Post-Op   Principal Problem:   Hip fracture (HCC) Active Problems:   Hypertension   Thrombocytosis (HCC)   Displaced fracture of right femoral neck (HCC)   WBAT with walker ASA, SCDs, TEDs PO pain control D/C home tomorrow, has 24 hr support   Chrystine Frogge, Horald Pollen 06/21/2017, 12:59 PM   Rod Can, MD Cell [phone]

## 2017-06-21 NOTE — Evaluation (Signed)
Physical Therapy Evaluation Patient Details Name: Jill Castaneda MRN: [id number] DOB: 1928-03-27 Today's Date: 06/21/2017   History of Present Illness  83 y.o.femalewith medical history significant of stroke, CAD, hypertension, admitted with fall and right hip fracture patient has a displaced noncommunited mid cervical right femoral neck fracture with mild varus angulation. s/p R hip hemiarthroplasty with anterior approach on 06/20/17.   Clinical Impression  Patient presents with decreased independence and safety with mobility due to deficits listed in PT problem list.  She will benefit from skilled PT in the acute setting to allow return home with family and caregiver support (24/7) and HHPT.    Follow Up Recommendations Home health PT;Supervision/Assistance - 24 hour    Equipment Recommendations  None recommended by PT    Recommendations for Other Services       Precautions / Restrictions Precautions Precautions: Fall      Mobility  Bed Mobility Overal bed mobility: Needs Assistance Bed Mobility: Supine to Sit     Supine to sit: Min assist;+2 for safety/equipment;HOB elevated     General bed mobility comments: Min A to elevate trunk and transition hips  Transfers Overall transfer level: Needs assistance Equipment used: Rolling walker (2 wheeled) Transfers: Sit to/from Stand Sit to Stand: Min assist         General transfer comment: Min A for balance ins tanding, cues for hand placement  Ambulation/Gait Ambulation/Gait assistance: Min guard;Min assist Ambulation Distance (Feet): 130 Feet Assistive device: Rolling walker (2 wheeled) Gait Pattern/deviations: Step-through pattern;Decreased stride length;Antalgic     General Gait Details: only slightly antalgic, able to walk with cues for balance and walker safety  Stairs Stairs: Yes Stairs assistance: Min assist Stair Management: One rail Right;Step to pattern;Sideways Number of Stairs: 3 (x 2) General stair  comments: sideways technique with both hands to one rail and cues throughout for sequence, performed twice for getting sequence down  Wheelchair Mobility    Modified Rankin (Stroke Patients Only)       Balance Overall balance assessment: Needs assistance Sitting-balance support: Feet supported;No upper extremity supported Sitting balance-Leahy Scale: Good     Standing balance support: Bilateral upper extremity supported;During functional activity Standing balance-Leahy Scale: Poor Standing balance comment: Reliant on UE support                             Pertinent Vitals/Pain Pain Score: 0-No pain Pain Intervention(s): Monitored during session    Home Living Family/patient expects to be discharged to:: Private residence 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) Available Help at Discharge: Family;Available 24 hours/day Type of Home: House Home Access: Stairs to enter Entrance Stairs-Rails: Psychiatric nurse of Steps: 4 Home Layout: Two level;Able to live on main level with bedroom/bathroom Home Equipment: Gilford Rile - 2 wheels;Bedside commode;Tub bench;Wheelchair - manual Additional Comments: Lives with her granddaughter (who works as a Heritage manager)    Prior Function Level of Independence: Needs Water engineer / Transfers Assistance Needed: Refuses to use DME  ADL's / Homemaking Assistance Needed: Performs ADLs and performs spoinge bath at sink; supervised for tub shower once a week with family. Pt also performs simple cooking tasks.   Comments: Sponge bath at sink unaided; goes once a week for bathing upstairs at tub with assist     Hand Dominance   Dominant Hand: Right    Extremity/Trunk Assessment   Upper Extremity Assessment Upper Extremity Assessment: Overall WFL for tasks assessed    Lower Extremity  Assessment Lower Extremity Assessment: RLE deficits/detail RLE Deficits / Details: AROM grossly WFL but some pain with  movement    Cervical / Trunk Assessment Cervical / Trunk Assessment: Kyphotic  Communication   Communication: Prefers language other than [place];Other (comment) (Grandaughter signed paperwork to interpret)  Cognition Arousal/Alertness: Awake/alert Behavior During Therapy: WFL for tasks assessed/performed Overall Cognitive Status: Within Functional Limits for tasks assessed                                        General Comments General comments (skin integrity, edema, etc.): Education with grandaughter present as interpreter and caregiver    Exercises Total Joint Exercises Ankle Circles/Pumps: AROM;Both;10 reps;Supine Quad Sets: AROM;Left;10 reps;Supine Heel Slides: AAROM;AROM;Left;10 reps;Supine Hip ABduction/ADduction: AROM;Left;10 reps;Supine   Assessment/Plan    PT Assessment Patient needs continued PT services  PT Problem List Decreased mobility;Decreased strength;Decreased activity tolerance;Decreased balance;Decreased knowledge of use of DME       PT Treatment Interventions DME instruction;Gait training;Stair training;Balance training;Functional mobility training;Therapeutic exercise;Patient/family education;Therapeutic activities    PT Goals (Current goals can be found in the Care Plan section)  Acute Rehab PT Goals Patient Stated Goal: Go home PT Goal Formulation: With patient/family Time For Goal Achievement: 06/25/17 Potential to Achieve Goals: Good    Frequency Min 5X/week   Barriers to discharge        Co-evaluation PT/OT/SLP Co-Evaluation/Treatment: Yes Reason for Co-Treatment: For patient/therapist safety PT goals addressed during session: Proper use of DME;Strengthening/ROM;Mobility/safety with mobility         AM-PAC PT "6 Clicks" Daily Activity  Outcome Measure Difficulty turning over in bed (including adjusting bedclothes, sheets and blankets)?: A Little Difficulty moving from lying on back to sitting on the side of the bed?  : A Little Difficulty sitting down on and standing up from a chair with arms (e.g., wheelchair, bedside commode, etc,.)?: Unable Help needed moving to and from a bed to chair (including a wheelchair)?: A Little Help needed walking in hospital room?: A Little Help needed climbing 3-5 steps with a railing? : A Lot 6 Click Score: 15    End of Session Equipment Utilized During Treatment: Gait belt Activity Tolerance: Patient tolerated treatment well Patient left: in chair;with call bell/phone within reach;with family/visitor present   PT Visit Diagnosis: Other abnormalities of gait and mobility (R26.89)    Time: 5993-5701 PT Time Calculation (min) (ACUTE ONLY): 32 min   Charges:   PT Evaluation $PT Eval Moderate Complexity: 1 Mod     PT G CodesMagda Kiel, [place] [phone] 06/21/2017   Reginia Naas 06/21/2017, 2:36 PM

## 2017-06-21 NOTE — Care Management Note (Signed)
Case Management Note  Patient Details  Name: Jill Castaneda MRN: [id number] Date of Birth: 13-Nov-1927  Subjective/Objective:    83 yr old female admitted after a fall, patient underwent a right hip hemiarthroplasty, anterior approach.                 Action/Plan: Case manager spoke with Stevie Kern, [facility] Liaison concerning Home Health services. Patient is uninsured, but does not qualify for charity, due to amount of income her daughter has. Patient's granddaughter says that she is a physical therapist and will continue to provide care for her grandmother. She lives with her and her mom. They have DME.    Expected Discharge Date:   06/21/17               Expected Discharge Plan:  Home/Self Care  In-House Referral:  NA  Discharge planning Services  CM Consult  Post Acute Care Choice:    Choice offered to:  Adult Children  DME Arranged:  N/A DME Agency:  NA  HH Arranged:   (Patient does not qualify for [place] services) [place] Agency:     Status of Service:  Completed, signed off  If discussed at [place] of Stay Meetings, dates discussed:    Additional Comments:  Ninfa Meeker, RN 06/21/2017, 11:35 AM

## 2017-06-22 ENCOUNTER — Encounter (HOSPITAL_COMMUNITY): Payer: Self-pay | Admitting: Orthopedic Surgery

## 2017-06-22 DIAGNOSIS — E876 Hypokalemia: Secondary | ICD-10-CM

## 2017-06-22 DIAGNOSIS — S72001A Fracture of unspecified part of neck of right femur, initial encounter for closed fracture: Secondary | ICD-10-CM

## 2017-06-22 DIAGNOSIS — D473 Essential (hemorrhagic) thrombocythemia: Secondary | ICD-10-CM

## 2017-06-22 LAB — CBC
HCT: 34.9 % — ABNORMAL LOW (ref 36.0–46.0)
HEMOGLOBIN: 11.1 g/dL — AB (ref 12.0–15.0)
MCH: 27 pg (ref 26.0–34.0)
MCHC: 31.8 g/dL (ref 30.0–36.0)
MCV: 84.9 fL (ref 78.0–100.0)
PLATELETS: 484 10*3/uL — AB (ref 150–400)
RBC: 4.11 MIL/uL (ref 3.87–5.11)
RDW: 15.4 % (ref 11.5–15.5)
WBC: 6.8 10*3/uL (ref 4.0–10.5)

## 2017-06-22 LAB — BASIC METABOLIC PANEL
ANION GAP: 4 — AB (ref 5–15)
BUN: 14 mg/dL (ref 6–20)
CALCIUM: 7.9 mg/dL — AB (ref 8.9–10.3)
CO2: 22 mmol/L (ref 22–32)
Chloride: 111 mmol/L (ref 101–111)
Creatinine, Ser: 0.88 mg/dL (ref 0.44–1.00)
GFR calc Af Amer: >60 mL/min (ref >60)
GFR calc non Af Amer: 57 mL/min — ABNORMAL LOW (ref >60)
GLUCOSE: 97 mg/dL (ref 65–99)
POTASSIUM: 4.4 mmol/L (ref 3.5–5.1)
SODIUM: 137 mmol/L (ref 135–145)

## 2017-06-22 MED ORDER — HYDROCODONE-ACETAMINOPHEN 5-325 MG PO TABS: 1.0000 | ORAL_TABLET | Freq: Four times a day (QID) | ORAL | 0 refills | Status: DC | PRN

## 2017-06-22 MED ORDER — METHOCARBAMOL 500 MG PO TABS
500.0000 mg | ORAL_TABLET | Freq: Three times a day (TID) | ORAL | 0 refills | Status: DC | PRN
Start: 2017-06-22 — End: 2019-01-26

## 2017-06-22 MED ORDER — POLYETHYLENE GLYCOL 3350 17 G PO PACK: 17.0000 g | PACK | Freq: Every day | ORAL | 0 refills | Status: DC

## 2017-06-22 NOTE — Progress Notes (Signed)
Occupational Therapy Treatment Patient Details Name: Jill Castaneda MRN: [id number] DOB: 1927/12/24 Today's Date: 06/22/2017    History of present illness 83 y.o.femalewith medical history significant of stroke, CAD, hypertension, admitted with fall and right hip fracture patient has a displaced noncommunited mid cervical right femoral neck fracture with mild varus angulation. s/p R hip hemiarthroplasty with anterior approach on 06/20/17.    OT comments  Pt progressing towards established OT goals. Pt performed dressing with Min A for donning underwear and Min Guard A for safety in standing. Educated pt and granddaughter on compensatory strategies for low vision to increase pt safety and independence; pt and granddaughter verbalized understanding. Answered all questions in preparation for dc later today.    Follow Up Recommendations  No OT follow up;Supervision/Assistance - 24 hour    Equipment Recommendations  None recommended by OT    Recommendations for Other Services      Precautions / Restrictions Precautions Precautions: Fall Restrictions Weight Bearing Restrictions: Yes RLE Weight Bearing: Weight bearing as tolerated       Mobility Bed Mobility               General bed mobility comments: Pt in chair upon arrival  Transfers Overall transfer level: Needs assistance Equipment used: Rolling walker (2 wheeled) Transfers: Sit to/from Stand Sit to Stand: Min guard         General transfer comment: Pt continues to require VCs for hand placement    Balance Overall balance assessment: Needs assistance Sitting-balance support: Feet supported;No upper extremity supported Sitting balance-Leahy Scale: Good     Standing balance support: Bilateral upper extremity supported;During functional activity Standing balance-Leahy Scale: Fair Standing balance comment: Pt able to pull pants over hips in standing without UE support                           ADL  either performed or assessed with clinical judgement   ADL Overall ADL's : Needs assistance/impaired                     Lower Body Dressing: Minimal assistance;Sit to/from stand;With caregiver independent assisting Lower Body Dressing Details (indicate cue type and reason): Provided education on LB dressing techniques. Pt requiring Min A to bring R foot into pant leg. Min Guard A for standing balance during dressing Toilet Transfer: Building control surveyor Details (indicate cue type and reason): Pt requiring VCs for hand placement during toilet transfer         Functional mobility during ADLs: Rolling walker;Min guard;Minimal assistance (single LOB; pt able to correct) General ADL Comments: Provided pt with education on safety and dressing. Pt granddaughter present throughout session and reinforced education. Provided further education on low vision during ADLs such as bright strips of tape on stairs, contrasting colors, and bright stickers for microwave/stove. Educated pt on letting family assist while she is healing to prevent falls. Also provided education on weighted utensils and positioning to decrease tremors during ADLs/IADLs.      Vision       Perception     Praxis      Cognition Arousal/Alertness: Awake/alert Behavior During Therapy: WFL for tasks assessed/performed Overall Cognitive Status: Within Functional Limits for tasks assessed  Exercises     Shoulder Instructions       General Comments Pt granddaughter present throughout session. Provided education on compensatory strantegies for Low vision    Pertinent Vitals/ Pain       Pain Assessment: Faces Faces Pain Scale: Hurts a little bit Pain Location: R hip Pain Descriptors / Indicators: Discomfort;Guarding;Sore Pain Intervention(s): Monitored during session;Repositioned  Home Living                                           Prior Functioning/Environment              Frequency  Min 2X/week        Progress Toward Goals  OT Goals(current goals can now be found in the care plan section)  Progress towards OT goals: Progressing toward goals  Acute Rehab OT Goals Patient Stated Goal: Go home OT Goal Formulation: With patient Time For Goal Achievement: 07/05/17 Potential to Achieve Goals: Good ADL Goals Pt Will Perform Lower Body Dressing: with min guard assist;sit to/from stand Pt Will Transfer to Toilet: with min guard assist;bedside commode;ambulating Pt Will Perform Toileting - Clothing Manipulation and hygiene: with min guard assist;sit to/from stand  Plan Discharge plan remains appropriate    Co-evaluation                 AM-PAC PT "6 Clicks" Daily Activity     Outcome Measure   Help from another person eating meals?: None Help from another person taking care of personal grooming?: A Little Help from another person toileting, which includes using toliet, bedpan, or urinal?: A Little Help from another person bathing (including washing, rinsing, drying)?: A Little Help from another person to put on and taking off regular upper body clothing?: A Little Help from another person to put on and taking off regular lower body clothing?: A Little 6 Click Score: 19    End of Session Equipment Utilized During Treatment: Rolling walker  OT Visit Diagnosis: Unsteadiness on feet (R26.81);Other abnormalities of gait and mobility (R26.89);Muscle weakness (generalized) (M62.81);Pain;History of falling (Z91.81) Pain - Right/Left: Right Pain - part of body: Hip;Leg   Activity Tolerance Patient tolerated treatment well   Patient Left in chair;with call bell/phone within reach;with family/visitor present   Nurse Communication Mobility status;Weight bearing status        Time: 4709-6283 OT Time Calculation (min): 26 min  Charges: OT General Charges $OT Visit: 1  Procedure OT Treatments $Self Care/Home Management : 23-37 mins  Wallace, OTR/L Acute Rehab Pager: [phone] Office: [place] 06/22/2017, 1:28 PM

## 2017-06-22 NOTE — Discharge Summary (Signed)
Physician Discharge Summary  Jill Castaneda JXB:[id number] DOB: [birth year] DOA: 06/19/2017  PCP: Patient, No Pcp Per  Admit date: 06/19/2017 Discharge date: 06/22/2017  Admitted From: Home Disposition:  Home  Recommendations for Outpatient Follow-up:  1. Follow up with Dr. Delfino Lovett in 2 weeks. 2. Aspirin 81 mg bid for 30 days For DVT prophylaxis, then transition to once daily.   Home Health:None (Lives with granddaughter who is a physical therapist and will provide home health.) Equipment/Devices:Rolling walker  Discharge Condition:Fair CODE STATUS:Full code Diet recommendation: Regular    Discharge Diagnoses:  Principal problem Closed displaced fracture of right femoral neck ([place])  Active Problems:   Hypertension   Thrombocytosis (HCC)   Hypokalemia  Brief narrative/history of present illness Please refer to admission H&P for details, In brief, 83 year old active female with history of CVA, CAD, hypertension who was admitted with a mechanical fall with a displaced right femoral neck fracture. Patient underwent right hip hemiarthroplasty on 8/20.   Hospital course Principal Problem:  Displaced right femoral neck fracture ([place]) Status post right hip hemiarthroplasty on 8/20. Tolerated well. Up with therapy. Pain control with Vicodin when necessary. Robaxin when necessary for muscle spasms. Added bowel regimen. -Stable for discharge home with outpatient follow-up with orthopedics. Granddaughter will provide home health physical therapy. -due to prophylaxis with aspirin 81 mg twice a day for 30 days.  Active Problems: Essential hypertension Stable. Continue amlodipine.  Coronary artery disease / history of stroke Stable. Continue aspirin.   Hypokalemia Replenished  Acute postoperative blood loss anemia Mild drop Postop. Stable.    Family Communication  : Granddaughter at bedside (patient speaks russian and she interpreted for her)  Disposition Plan  :  Home   Consults  :  [place] [place] [facility]  Procedures  : Right hip hemiarthroplasty    Discharge Instructions   Allergies as of 06/22/2017   No Known Allergies     Medication List    STOP taking these medications   aspirin EC 81 MG tablet Replaced by:  aspirin 81 MG chewable tablet     TAKE these medications   amLODipine 2.5 MG tablet Commonly known as:  NORVASC TAKE ONE TABLET BY MOUTH ONCE DAILY   aspirin 81 MG chewable tablet Chew 1 tablet (81 mg total) by mouth 2 (two) times daily with a meal. Replaces:  aspirin EC 81 MG tablet   HYDROcodone-acetaminophen 5-325 MG tablet Commonly known as:  NORCO/VICODIN Take 1 tablet by mouth every 6 (six) hours as needed for moderate pain.   methocarbamol 500 MG tablet Commonly known as:  ROBAXIN Take 1 tablet (500 mg total) by mouth every 8 (eight) hours as needed for muscle spasms.   multivitamin with minerals Tabs tablet Take 1 tablet by mouth once a week.   omega-3 acid ethyl esters 1 g capsule Commonly known as:  LOVAZA Take 1 g by mouth daily.   polyethylene glycol packet Commonly known as:  MIRALAX / GLYCOLAX Take 17 g by mouth daily.            Discharge Care Instructions        Start     Ordered   06/22/17 0000  polyethylene glycol (MIRALAX / GLYCOLAX) packet  Daily     06/22/17 0906   06/22/17 0000  HYDROcodone-acetaminophen (NORCO/VICODIN) 5-325 MG tablet  Every 6 hours PRN     06/22/17 1109   06/22/17 0000  methocarbamol (ROBAXIN) 500 MG tablet  Every 8 hours PRN     06/22/17 1110  06/21/17 0000  aspirin 81 MG chewable tablet  2 times daily with meals     06/21/17 1300     Follow-up Information    Swinteck, Aaron Edelman, MD. Schedule an appointment as soon as possible for a visit in 2 week(s).   Specialty:  Orthopedic Surgery Why:  For wound re-check Contact information: [address] [address] [phone]          No Known  Allergies    Procedures/Studies: Dg Chest 1 View  Result Date: 06/19/2017 CLINICAL DATA:  Pt fell this AM at her home. Pt having right side hip pain from fall. Preop exam. Takes HTN meds and has a hx of having a stroke. EXAM: CHEST 1 VIEW COMPARISON:  04/12/2014 FINDINGS: Cardiac silhouette is normal in size. No mediastinal or hilar masses or evidence of adenopathy. Clear lungs. No pleural effusion or pneumothorax. Skeletal structures are diffusely demineralized but grossly intact. IMPRESSION: No acute cardiopulmonary disease. Electronically Signed   By: Lajean Manes M.D.   On: 06/19/2017 11:59   Pelvis Portable  Result Date: 06/20/2017 CLINICAL DATA:  Right hip replacement. EXAM: PORTABLE PELVIS 1-2 VIEWS COMPARISON:  None. FINDINGS: The patient is status post right hip replacement. Surgical hardware in the cerclage wire are in good position. Postoperative air seen in the soft tissues. The patient is status post previa replacement of the left hip as well. IMPRESSION: Acute right hip replacement as above. Electronically Signed   By: Dorise Bullion III M.D   On: 06/20/2017 20:56   Dg C-arm 61-120 Min  Result Date: 06/20/2017 CLINICAL DATA:  Intraoperative fluoroscopy during right hip replacement. FLUOROSCOPY TIME:  10 seconds. Images: 3 EXAM: OPERATIVE RIGHT HIP (WITH PELVIS IF PERFORMED)  VIEWS TECHNIQUE: Fluoroscopic spot image(s) were submitted for interpretation post-operatively. COMPARISON:  None. FINDINGS: Three images were obtained during right hip replacement. The acetabular and femoral components are in good position by the end of the study. IMPRESSION: Right hip replacement as above. Electronically Signed   By: Dorise Bullion III M.D   On: 06/20/2017 20:56   Dg Hip Operative Unilat W Or W/o Pelvis Right  Result Date: 06/20/2017 CLINICAL DATA:  Intraoperative fluoroscopy during right hip replacement. FLUOROSCOPY TIME:  10 seconds. Images: 3 EXAM: OPERATIVE RIGHT HIP (WITH PELVIS IF  PERFORMED)  VIEWS TECHNIQUE: Fluoroscopic spot image(s) were submitted for interpretation post-operatively. COMPARISON:  None. FINDINGS: Three images were obtained during right hip replacement. The acetabular and femoral components are in good position by the end of the study. IMPRESSION: Right hip replacement as above. Electronically Signed   By: Dorise Bullion III M.D   On: 06/20/2017 20:56   Dg Hip Unilat With Pelvis 2-3 Views Right  Result Date: 06/19/2017 CLINICAL DATA:  Pt fell this AM at her home. Pt having right side hip pain from fall. EXAM: DG HIP (WITH OR WITHOUT PELVIS) 2-3V RIGHT COMPARISON:  None. FINDINGS: There is a displaced fracture of the right femoral neck, mid cervical. The distal fracture component has displaced superiorly by 13 mm. There is mild varus angulation. No comminution. No other fractures.  No dislocation. Left hip arthroplasty appears well-seated and well-aligned. SI joints and symphysis pubis are normally aligned. Bones are demineralized. IMPRESSION: Displaced, non comminuted, mid cervical right femoral neck fracture. Mild varus angulation. Electronically Signed   By: Lajean Manes M.D.   On: 06/19/2017 12:00   Dg Femur [place], [place] 2 Views Right  Result Date: 06/19/2017 CLINICAL DATA:  Fracture right hip. EXAM:  RIGHT FEMUR PORTABLE 2 VIEW COMPARISON:  None. FINDINGS: The femur was imaged from below the hip through the knee. No additional fractures identified on this study. IMPRESSION: No fractures through the mid or distal femur. Electronically Signed   By: Dorise Bullion III M.D   On: 06/19/2017 13:10      Subjective: Feels better. Participated with physical therapy yesterday. Has not required much pain medications.  Discharge Exam: Vitals:   06/21/17 2100 06/22/17 0644  BP:  124/68  Pulse:  69  Resp:  16  Temp:  98.7 F (37.1 C)  SpO2: 92% 93%   Vitals:   06/21/17 1740 06/21/17 2036 06/21/17 2100 06/22/17 0644  BP: (!) 105/55 119/62  124/68  Pulse: 67  73  69  Resp:  16  16  Temp: 98.7 F (37.1 C) 99.1 F (37.3 C)  98.7 F (37.1 C)  TempSrc: Oral Oral  Oral  SpO2: (!) 89% (!) 79% 92% 93%  Weight:      Height:        General: Elderly pleasant female not in distress   HEENT: Moist mucosa, supple neck Chest: Clear bilaterally CVS: Normal S1 and S2, no murmurs GI: Soft, nondistended, nontender Musculoskeletal: Warm, clean dressing over the right hip     The results of significant diagnostics from this hospitalization (including imaging, microbiology, ancillary and laboratory) are listed below for reference.     Microbiology: Recent Results (from the past 240 hour(s))  Surgical pcr screen     Status: None   Collection Time: 06/19/17 11:34 PM  Result Value Ref Range Status   MRSA, PCR NEGATIVE NEGATIVE Final   Staphylococcus aureus NEGATIVE NEGATIVE Final    Comment:        The Xpert SA Assay (FDA approved for NASAL specimens in patients over 37 years of age), is one component of a comprehensive surveillance program.  Test performance has been validated by [facility] for patients greater than or equal to 32 year old. It is not intended to diagnose infection nor to guide or monitor treatment.      Labs: BNP (last 3 results) No results for input(s): BNP in the last 8760 hours. Basic Metabolic Panel:  Recent Labs Lab 06/19/17 1119 06/20/17 0412 06/21/17 0315 06/22/17 0301  NA 139 137 139 137  K 3.8 3.5 3.4* 4.4  CL 105 107 110 111  CO2 25 23 24 22   GLUCOSE 89 102* 123* 97  BUN 19 17 20 14   CREATININE 0.99 0.99 1.17* 0.88  CALCIUM 9.0 8.0* 7.7* 7.9*   Liver Function Tests: No results for input(s): AST, ALT, ALKPHOS, BILITOT, PROT, ALBUMIN in the last 168 hours. No results for input(s): LIPASE, AMYLASE in the last 168 hours. No results for input(s): AMMONIA in the last 168 hours. CBC:  Recent Labs Lab 06/19/17 1119 06/20/17 0412 06/21/17 0315 06/22/17 0301  WBC 9.4 9.1 11.0* 6.8  NEUTROABS 7.9*   --   --   --   HGB 15.3* 13.6 11.8* 11.1*  HCT 47.0* 41.7 37.9 34.9*  MCV 84.1 82.6 84.4 84.9  PLT 795* 697* 568* 484*   Cardiac Enzymes: No results for input(s): CKTOTAL, CKMB, CKMBINDEX, TROPONINI in the last 168 hours. BNP: Invalid input(s): POCBNP CBG: No results for input(s): GLUCAP in the last 168 hours. D-Dimer No results for input(s): DDIMER in the last 72 hours. Hgb A1c No results for input(s): HGBA1C in the last 72 hours. Lipid Profile No results for input(s): CHOL, HDL, LDLCALC, TRIG,  CHOLHDL, LDLDIRECT in the last 72 hours. Thyroid function studies No results for input(s): TSH, T4TOTAL, T3FREE, THYROIDAB in the last 72 hours.  Invalid input(s): FREET3 Anemia work up No results for input(s): VITAMINB12, FOLATE, FERRITIN, TIBC, IRON, RETICCTPCT in the last 72 hours. Urinalysis    Component Value Date/Time   COLORURINE YELLOW 06/19/2017 1110   APPEARANCEUR CLEAR 06/19/2017 1110   LABSPEC 1.011 06/19/2017 1110   PHURINE 7.0 06/19/2017 1110   GLUCOSEU NEGATIVE 06/19/2017 1110   HGBUR NEGATIVE 06/19/2017 1110   [place] 06/19/2017 1110   [place] 06/19/2017 1110   PROTEINUR NEGATIVE 06/19/2017 1110   NITRITE NEGATIVE 06/19/2017 1110   LEUKOCYTESUR NEGATIVE 06/19/2017 1110   Sepsis Labs Invalid input(s): PROCALCITONIN,  WBC,  LACTICIDVEN Microbiology Recent Results (from the past 240 hour(s))  Surgical pcr screen     Status: None   Collection Time: 06/19/17 11:34 PM  Result Value Ref Range Status   MRSA, PCR NEGATIVE NEGATIVE Final   Staphylococcus aureus NEGATIVE NEGATIVE Final    Comment:        The Xpert SA Assay (FDA approved for NASAL specimens in patients over 39 years of age), is one component of a comprehensive surveillance program.  Test performance has been validated by [facility] for patients greater than or equal to 27 year old. It is not intended to diagnose infection nor to guide or monitor treatment.      Time  coordinating discharge: < 30 minutes  SIGNED:   Louellen Molder, MD  [facility] 06/22/2017, 12:43 PM Pager   If 7PM-7AM, please contact night-coverage [email or web address] Password [email or web address]

## 2017-06-22 NOTE — Progress Notes (Signed)
Physical Therapy Treatment Patient Details Name: Jill Castaneda MRN: [id number] DOB: February 03, 1928 Today's Date: 06/22/2017    History of Present Illness 83 y.o.femalewith medical history significant of stroke, CAD, hypertension, admitted with fall and right hip fracture patient has a displaced noncommunited mid cervical right femoral neck fracture with mild varus angulation. s/p R hip hemiarthroplasty with anterior approach on 06/20/17.     PT Comments    Patient ambulating in room with family member upon arrival and getting ready to discharge. HEP and transfers reviewed with pt and family and all questions answered as far as mobility and activity progression is concerned. Pt will have 24 hour assistance upon d/c. Current plan remains appropriate.   Follow Up Recommendations  Home health PT;Supervision/Assistance - 24 hour     Equipment Recommendations  None recommended by PT    Recommendations for Other Services       Precautions / Restrictions Precautions Precautions: Fall Restrictions Weight Bearing Restrictions: Yes RLE Weight Bearing: Weight bearing as tolerated    Mobility  Bed Mobility               General bed mobility comments: pt ambulating in room with family member   Transfers                 General transfer comment: transfer demonstrated for pt and family member   Ambulation/Gait Ambulation/Gait assistance: Supervision Ambulation Distance (Feet): 10 Feet Assistive device: Rolling walker (2 wheeled) Gait Pattern/deviations: Step-through pattern;Decreased stride length;Antalgic;Trunk flexed;Decreased weight shift to right     General Gait Details: mildly antalgic gait; discussed safety and use of RW as well as limiting stepping backwards to avoid posterior LOB   Stairs         General stair comments: reviewed sequencing and technique with pt and family member  Wheelchair Mobility    Modified Rankin (Stroke Patients Only)        Balance Overall balance assessment: Needs assistance Sitting-balance support: Feet supported;No upper extremity supported Sitting balance-Leahy Scale: Good     Standing balance support: Bilateral upper extremity supported;During functional activity Standing balance-Leahy Scale: Poor Standing balance comment: Reliant on UE support                            Cognition Arousal/Alertness: Awake/alert Behavior During Therapy: WFL for tasks assessed/performed Overall Cognitive Status: Within Functional Limits for tasks assessed                                        Exercises      General Comments General comments (skin integrity, edema, etc.): reviewed HEP frequency       Pertinent Vitals/Pain Pain Assessment: Faces Faces Pain Scale: Hurts a little bit Pain Location: R hip Pain Descriptors / Indicators: Discomfort;Guarding;Sore Pain Intervention(s): Monitored during session    Home Living                      Prior Function            PT Goals (current goals can now be found in the care plan section) Acute Rehab PT Goals Patient Stated Goal: Go home PT Goal Formulation: With patient/family Time For Goal Achievement: 06/25/17 Potential to Achieve Goals: Good Progress towards PT goals: Progressing toward goals    Frequency    Min 5X/week  PT Plan Current plan remains appropriate    Co-evaluation              AM-PAC PT "6 Clicks" Daily Activity  Outcome Measure  Difficulty turning over in bed (including adjusting bedclothes, sheets and blankets)?: A Little Difficulty moving from lying on back to sitting on the side of the bed? : A Little Difficulty sitting down on and standing up from a chair with arms (e.g., wheelchair, bedside commode, etc,.)?: A Lot Help needed moving to and from a bed to chair (including a wheelchair)?: A Little Help needed walking in hospital room?: A Little Help needed climbing 3-5 steps  with a railing? : A Little 6 Click Score: 17    End of Session Equipment Utilized During Treatment: Gait belt Activity Tolerance: Patient tolerated treatment well Patient left: with call bell/phone within reach;with family/visitor present Nurse Communication: Mobility status PT Visit Diagnosis: Other abnormalities of gait and mobility (R26.89)     Time: 4627-0350 PT Time Calculation (min) (ACUTE ONLY): 10 min  Charges:  $Therapeutic Activity: 8-22 mins                    G Codes:       Earney Navy, PTA Pager: [phone]     Darliss Cheney 06/22/2017, 12:04 PM

## 2017-06-23 ENCOUNTER — Other Ambulatory Visit: Payer: Self-pay

## 2017-06-23 ENCOUNTER — Emergency Department (HOSPITAL_COMMUNITY): Payer: Self-pay

## 2017-06-23 ENCOUNTER — Emergency Department (HOSPITAL_COMMUNITY)
Admission: EM | Admit: 2017-06-23 | Discharge: 2017-06-23 | Disposition: A | Discharge status: Home / Self Care | Payer: Self-pay | Attending: Emergency Medicine | Admitting: Emergency Medicine

## 2017-06-23 ENCOUNTER — Encounter (HOSPITAL_COMMUNITY): Payer: Self-pay

## 2017-06-23 DIAGNOSIS — Z79899 Other long term (current) drug therapy: Secondary | ICD-10-CM | POA: Insufficient documentation

## 2017-06-23 DIAGNOSIS — Z96641 Presence of right artificial hip joint: Secondary | ICD-10-CM | POA: Insufficient documentation

## 2017-06-23 DIAGNOSIS — I2699 Other pulmonary embolism without acute cor pulmonale: Secondary | ICD-10-CM | POA: Insufficient documentation

## 2017-06-23 DIAGNOSIS — I10 Essential (primary) hypertension: Secondary | ICD-10-CM | POA: Insufficient documentation

## 2017-06-23 DIAGNOSIS — I2510 Atherosclerotic heart disease of native coronary artery without angina pectoris: Secondary | ICD-10-CM | POA: Insufficient documentation

## 2017-06-23 LAB — BASIC METABOLIC PANEL
ANION GAP: 6 (ref 5–15)
BUN: 11 mg/dL (ref 6–20)
CHLORIDE: 107 mmol/L (ref 101–111)
CO2: 25 mmol/L (ref 22–32)
Calcium: 8.4 mg/dL — ABNORMAL LOW (ref 8.9–10.3)
Creatinine, Ser: 0.98 mg/dL (ref 0.44–1.00)
GFR calc Af Amer: 58 mL/min — ABNORMAL LOW (ref >60)
GFR, EST NON AFRICAN AMERICAN: 50 mL/min — AB (ref >60)
GLUCOSE: 95 mg/dL (ref 65–99)
POTASSIUM: 4.3 mmol/L (ref 3.5–5.1)
Sodium: 138 mmol/L (ref 135–145)

## 2017-06-23 LAB — CBC
HEMATOCRIT: 38.5 % (ref 36.0–46.0)
HEMOGLOBIN: 12.3 g/dL (ref 12.0–15.0)
MCH: 27 pg (ref 26.0–34.0)
MCHC: 31.9 g/dL (ref 30.0–36.0)
MCV: 84.4 fL (ref 78.0–100.0)
Platelets: 629 10*3/uL — ABNORMAL HIGH (ref 150–400)
RBC: 4.56 MIL/uL (ref 3.87–5.11)
RDW: 15 % (ref 11.5–15.5)
WBC: 8 10*3/uL (ref 4.0–10.5)

## 2017-06-23 LAB — BRAIN NATRIURETIC PEPTIDE: B NATRIURETIC PEPTIDE 5: 144.1 pg/mL — AB (ref 0.0–100.0)

## 2017-06-23 LAB — HEPATIC FUNCTION PANEL
ALK PHOS: 45 U/L (ref 38–126)
ALT: 23 U/L (ref 14–54)
AST: 70 U/L — AB (ref 15–41)
Albumin: 2.8 g/dL — ABNORMAL LOW (ref 3.5–5.0)
BILIRUBIN DIRECT: 0.1 mg/dL (ref 0.1–0.5)
BILIRUBIN INDIRECT: 1.2 mg/dL — AB (ref 0.3–0.9)
Total Bilirubin: 1.3 mg/dL — ABNORMAL HIGH (ref 0.3–1.2)
Total Protein: 5.9 g/dL — ABNORMAL LOW (ref 6.5–8.1)

## 2017-06-23 LAB — LIPASE, BLOOD: Lipase: 31 U/L (ref 11–51)

## 2017-06-23 LAB — I-STAT TROPONIN, ED: Troponin i, poc: 0.04 ng/mL (ref 0.00–0.08)

## 2017-06-23 MED ORDER — RIVAROXABAN 15 MG PO TABS
15.0000 mg | ORAL_TABLET | Freq: Once | ORAL | Status: DC
  Filled 2017-06-23: qty 1

## 2017-06-23 MED ORDER — ENOXAPARIN SODIUM 150 MG/ML ~~LOC~~ SOLN: 1.5000 mg/kg | SUBCUTANEOUS | 2 refills | Status: DC

## 2017-06-23 MED ORDER — IOPAMIDOL (ISOVUE-370) INJECTION 76%
INTRAVENOUS | Status: AC
  Administered 2017-06-23: 100 mL
  Filled 2017-06-23: qty 100

## 2017-06-23 MED ORDER — ENOXAPARIN SODIUM 80 MG/0.8ML ~~LOC~~ SOLN
1.5000 mg/kg | Freq: Once | SUBCUTANEOUS | Status: AC
  Administered 2017-06-23: 22:00:00 70 mg via SUBCUTANEOUS
  Filled 2017-06-23: qty 0.8

## 2017-06-23 MED ORDER — RIVAROXABAN (XARELTO) EDUCATION KIT FOR DVT/PE PATIENTS
PACK | Freq: Once | Status: DC
  Filled 2017-06-23: qty 1

## 2017-06-23 NOTE — ED Triage Notes (Signed)
Pt endorses CP and low spo2 sat x 3 days. Pt had recent right hip replacement and was d/c from here yesterday and did not tell anyone in the hospital that she was having chest discomfort. Pt's spo2 at home last night was in the 70s at time but pt denies shob. VSS in triage. Pt sent here to rule out PE. Pt already had CXR.

## 2017-06-23 NOTE — Care Management (Signed)
ED CM consulted by ED Pharmacist conceriing medication assistance with anti-coagulants. Patient was recently discharged from [facility] hospital s/p fall with right hip hemiarthroplasty .  Patient is from [place] and is uninsured  so does not qualify for assistance. ED CM and ED Pharmacist discussed the possible cost of most anti-coagulants which would be several hundred dollars. Discussed with Dr. Vallery Ridge , informed her that the [facility] has provided some sample kits of Lovenox. CM and Pharmacist discussed this with patient and family who are agreeable to receive the assistance. Updated Dr. Vallery Ridge she will prescribe Lovenox. ED Pharmacist provided patient and family with teaching and instructions on administration of Lovenox, patient and family was given Lovenox by  ED Pharmacist. Patient will  follow up with PCP next week as per family. No further ED CM needs identified.

## 2017-06-23 NOTE — ED Provider Notes (Signed)
[place] DEPT Provider Note   CSN: [id number] Arrival date & time: 06/23/17  1659     History   Chief Complaint Chief Complaint  Patient presents with  . Chest Pain    HPI Jill Castaneda is a 83 y.o. female.  HPI Patient is status post right hip replacement 8\20. She was discharged from the hospital yesterday. Today she has developed left-sided chest pain. It is sharp in quality with it is worse with breathing in. She has felt short of breath. Her granddaughter also has noted a small amount of dry cough developing just since yesterday. No sputum or fever. Her granddaughter notes a slight amount of increased swelling at her left ankle. Patient is denying pain in the lower extremities. Patient's granddaughter reports they are watching an area of slight redness around the hip incision site marked it out yesterday and it has not gotten any worse. Patient's granddaughter reports that she is not requiring any significant amount of pain medication. She is only taken an occasional Tylenol. She was seen at [facility] today. Based on patient's presentation and risk factor with recent hip replacement, there was concern for pulmonary embolus. Patient was referred to the emergency department for CT of the chest to rule out pulmonary embolus and further evaluate patient's symptoms. Past Medical History:  Diagnosis Date  . Cataract   . Complication of anesthesia    Difficulty waking up "  . Coronary artery disease   . Hip fracture ([place]) 06/2017   right hip  . Hypertension   . Osteoporosis   . Stroke [facility])     Patient Active Problem List   Diagnosis Date Noted  . Closed displaced fracture of right femoral neck ([place])   . Hypokalemia   . Thrombocytosis ([place]) 06/20/2017  . Displaced fracture of right femoral neck ([place]) 06/20/2017  . NSTEMI (non-ST elevated myocardial infarction) ([place]) 04/15/2014  . Non-STEMI (non-ST elevated myocardial infarction) ([place]) 04/13/2014  .  Hypertension   . CVA (cerebral infarction) 03/08/2014    Past Surgical History:  Procedure Laterality Date  . ANTERIOR APPROACH HEMI HIP ARTHROPLASTY Right 06/20/2017   Procedure: ANTERIOR APPROACH HEMI HIP ARTHROPLASTY;  Surgeon: Rod Can, MD;  Location: [place];  Service: Orthopedics;  Laterality: Right;  . FRACTURE SURGERY    . SHOULDER HARDWARE REMOVAL Left   . SHOULDER HEMI-ARTHROPLASTY Left   . TOTAL HIP ARTHROPLASTY Right     OB History    No data available       Home Medications    Prior to Admission medications   Medication Sig Start Date End Date Taking? Authorizing Provider  amLODipine (NORVASC) 2.5 MG tablet TAKE ONE TABLET BY MOUTH ONCE DAILY Patient taking differently: TAKE 2.5 mg TABLET BY MOUTH ONCE DAILY 02/22/17  Yes Jacqulynn Cadet, [place], PA-C  aspirin 81 MG chewable tablet Chew 1 tablet (81 mg total) by mouth 2 (two) times daily with a meal. 06/21/17  Yes Swinteck, Aaron Edelman, MD  HYDROcodone-acetaminophen (NORCO/VICODIN) 5-325 MG tablet Take 1 tablet by mouth every 6 (six) hours as needed for moderate pain. 06/22/17  Yes Dhungel, Nishant, MD  methocarbamol (ROBAXIN) 500 MG tablet Take 1 tablet (500 mg total) by mouth every 8 (eight) hours as needed for muscle spasms. 06/22/17  Yes Dhungel, Nishant, MD  Multiple Vitamin (MULTIVITAMIN WITH MINERALS) TABS tablet Take 1 tablet by mouth once a week.    Yes [provider]  omega-3 acid ethyl esters (LOVAZA) 1 g capsule Take 1 g by mouth daily.  Yes [provider]  polyethylene glycol (MIRALAX / GLYCOLAX) packet Take 17 g by mouth daily. 06/22/17  Yes Dhungel, Nishant, MD  enoxaparin (LOVENOX) 150 MG/ML injection Inject 0.47 mLs (70 mg total) into the skin daily. 06/23/17   Charlesetta Shanks, MD    Family History Family History  Problem Relation Age of Onset  . Hypertension Father   . Heart disease Father   . Hypertension Sister   . Heart disease Sister   . Stroke Mother   . Cancer Brother     Social  History Social History  Substance Use Topics  . Smoking status: Never Smoker  . Smokeless tobacco: Never Used  . Alcohol use No     Allergies   Patient has no known allergies.   Review of Systems Review of Systems 10 Systems reviewed and are negative for acute change except as noted in the HPI.   Physical Exam Updated Vital Signs BP (!) 143/77 (BP Location: Left Arm)   Pulse 82   Temp 98.7 F (37.1 C) (Oral)   Resp 16   Ht 4' 9"  (1.448 m)   Wt 47.2 kg (104 lb)   SpO2 95%   BMI 22.51 kg/m   Physical Exam  Constitutional: She appears well-developed and well-nourished. No distress.  HENT:  Head: Normocephalic and atraumatic.  Eyes: Conjunctivae and EOM are normal.  Neck: Neck supple.  Cardiovascular: Normal rate, regular rhythm, normal heart sounds and intact distal pulses.   No murmur heard. Pulmonary/Chest: Effort normal and breath sounds normal. No respiratory distress.  Abdominal: Soft. There is no tenderness.  Musculoskeletal:  Patient has a dressing over her lateral right hip. There is some faint erythema that has a marker outline. This is not extending beyond the outline placed yesterday. Patient expresses no pain. I can't appreciate any significant swelling of lower extremities. Patient does have varicose veins. She has some swelling of the ankle itself on the left which reportedly is greater than usual. Her calves and lower legs do not appear edematous or tender. Patient has varicosities and some scars of the lower legs, skin condition is very good without signs of any active wounds or cellulitis.  Neurological: She is alert. No cranial nerve deficit. She exhibits normal muscle tone. Coordination normal.  Patient is Russian-speaking. She is interacting in an normal fashion with her granddaughter answering questions and following commands.  Skin: Skin is warm and dry.  Psychiatric: She has a normal mood and affect.  Nursing note and vitals reviewed.    ED  Treatments / Results  Labs (all labs ordered are listed, but only abnormal results are displayed) Labs Reviewed  BASIC METABOLIC PANEL - Abnormal; Notable for the following:       Result Value   Calcium 8.4 (*)    GFR calc non Af Amer 50 (*)    GFR calc Af Amer 58 (*)    All other components within normal limits  CBC - Abnormal; Notable for the following:    Platelets 629 (*)    All other components within normal limits  HEPATIC FUNCTION PANEL - Abnormal; Notable for the following:    Total Protein 5.9 (*)    Albumin 2.8 (*)    AST 70 (*)    Total Bilirubin 1.3 (*)    Indirect Bilirubin 1.2 (*)    All other components within normal limits  BRAIN NATRIURETIC PEPTIDE - Abnormal; Notable for the following:    B Natriuretic Peptide 144.1 (*)  All other components within normal limits  LIPASE, BLOOD  I-STAT TROPONIN, ED    EKG  EKG Interpretation None       Radiology Ct Angio Chest Pe W/cm &/or Wo Cm  Result Date: 06/23/2017 CLINICAL DATA:  Chest pain. EXAM: CT ANGIOGRAPHY CHEST WITH CONTRAST TECHNIQUE: Multidetector CT imaging of the chest was performed using the standard protocol during bolus administration of intravenous contrast. Multiplanar CT image reconstructions and MIPs were obtained to evaluate the vascular anatomy. CONTRAST:  58 mL of Isovue 370 intravenously. COMPARISON:  CT scan of April 12, 2014. FINDINGS: Cardiovascular: Linear filling defect is seen in lower lobe branch of right pulmonary artery consistent with acute pulmonary embolus. 4.1 cm ascending thoracic aortic aneurysm is noted. Atherosclerosis is noted as well. Normal cardiac size. No pericardial effusion. Mediastinum/Nodes: No enlarged mediastinal, hilar, or axillary lymph nodes. Thyroid gland, trachea, and esophagus demonstrate no significant findings. Lungs/Pleura: No pneumothorax is noted. Minimal bilateral posterior basilar subsegmental atelectasis is noted. Upper Abdomen: No acute abnormality.  Musculoskeletal: No chest wall abnormality. No acute or significant osseous findings. Review of the MIP images confirms the above findings. IMPRESSION: Linear filling defect seen in lower lobe branch right pulmonary artery consistent with acute pulmonary embolus. Critical Value/emergent results were called by telephone at the time of interpretation on 06/23/2017 at 7:51 pm to Dr. Charlesetta Shanks , who verbally acknowledged these results. 4.1 cm ascending thoracic aortic aneurysm. Recommend annual imaging followup by CTA or MRA. This recommendation follows 2010 ACCF/AHA/AATS/ACR/ASA/SCA/SCAI/SIR/STS/SVM Guidelines for the Diagnosis and Management of Patients with Thoracic Aortic Disease. Circulation. 2010; 121: O459-X774. Aortic Atherosclerosis (ICD10-I70.0). Electronically Signed   By: Marijo Conception, M.D.   On: 06/23/2017 19:52    Procedures Procedures (including critical care time)  Medications Ordered in ED Medications  rivaroxaban (XARELTO) Education Kit for DVT/PE patients (not administered)  Rivaroxaban (XARELTO) tablet 15 mg (not administered)  enoxaparin (LOVENOX) injection 70 mg (not administered)  iopamidol (ISOVUE-370) 76 % injection (100 mLs  Contrast Given 06/23/17 1930)     Initial Impression / Assessment and Plan / ED Course  I have reviewed the triage vital signs and the nursing notes.  Pertinent labs & imaging results that were available during my care of the patient were reviewed by me and considered in my medical decision making (see chart for details).     Final Clinical Impressions(s) / ED Diagnoses   Final diagnoses:  Other acute pulmonary embolism without acute cor pulmonale (HCC)  Status post right hip replacement  Patient presents as all and above. She is identified to have peripheral pulmonary embolus without signs of cor pulmonale. Patient's vital signs are stable. She does not have restricted distress. Plan is to treat for outpatient. Family reports that they need  an inexpensive option for anticoagulation. Although the patient is old enough for Medicare, she does not of Social Security card and does not have Medicare. Fortunately social services had a 3 month supply of Lovenox that they were able to dispense. Family felt comfortable doing injections and wished to use the Lovenox provided as the only alternative that could likely afford would be warfarin.  New Prescriptions New Prescriptions   ENOXAPARIN (LOVENOX) 150 MG/ML INJECTION    Inject 0.47 mLs (70 mg total) into the skin daily.     Charlesetta Shanks, MD 06/26/17 2145

## 2017-06-23 NOTE — ED Notes (Signed)
On way to XR 

## 2017-06-23 NOTE — Discharge Instructions (Signed)
1. Take Lovenox 1.5 mg/kg once daily. 2. Because your blood clot was caused as a complication of surgery, you should be able to discontinue blood thinners after 3 months of treatment. 3. Watch for any signs of bleeding complications and return to the emergency department immediately if you develop severe headache, bloody stool, unusual pain or other concerning symptoms. 4. See her doctor as soon as possible to have ongoing management and rechecks for your treatment plan.

## 2017-06-23 NOTE — ED Notes (Signed)
Pt to CT at this time.  Granddaughter with pt.

## 2019-01-25 ENCOUNTER — Encounter (HOSPITAL_COMMUNITY): Payer: Self-pay

## 2019-01-25 ENCOUNTER — Emergency Department (HOSPITAL_COMMUNITY): Payer: Medicaid Other

## 2019-01-25 ENCOUNTER — Other Ambulatory Visit: Payer: Self-pay

## 2019-01-25 ENCOUNTER — Inpatient Hospital Stay (HOSPITAL_COMMUNITY)
Admission: EM | Admit: 2019-01-25 | Discharge: 2019-01-29 | DRG: 064 | Disposition: A | Discharge status: Rehabilitation Facility | Payer: Medicaid Other | Attending: Internal Medicine | Admitting: Internal Medicine

## 2019-01-25 DIAGNOSIS — D473 Essential (hemorrhagic) thrombocythemia: Secondary | ICD-10-CM | POA: Diagnosis present

## 2019-01-25 DIAGNOSIS — R29703 NIHSS score 3: Secondary | ICD-10-CM | POA: Diagnosis not present

## 2019-01-25 DIAGNOSIS — R29701 NIHSS score 1: Secondary | ICD-10-CM | POA: Diagnosis not present

## 2019-01-25 DIAGNOSIS — D471 Chronic myeloproliferative disease: Secondary | ICD-10-CM

## 2019-01-25 DIAGNOSIS — E785 Hyperlipidemia, unspecified: Secondary | ICD-10-CM | POA: Diagnosis present

## 2019-01-25 DIAGNOSIS — I452 Bifascicular block: Secondary | ICD-10-CM | POA: Diagnosis present

## 2019-01-25 DIAGNOSIS — R0602 Shortness of breath: Secondary | ICD-10-CM

## 2019-01-25 DIAGNOSIS — Z66 Do not resuscitate: Secondary | ICD-10-CM | POA: Diagnosis present

## 2019-01-25 DIAGNOSIS — R471 Dysarthria and anarthria: Secondary | ICD-10-CM

## 2019-01-25 DIAGNOSIS — G8194 Hemiplegia, unspecified affecting left nondominant side: Secondary | ICD-10-CM | POA: Diagnosis present

## 2019-01-25 DIAGNOSIS — Z823 Family history of stroke: Secondary | ICD-10-CM

## 2019-01-25 DIAGNOSIS — R531 Weakness: Secondary | ICD-10-CM

## 2019-01-25 DIAGNOSIS — I2510 Atherosclerotic heart disease of native coronary artery without angina pectoris: Secondary | ICD-10-CM | POA: Diagnosis present

## 2019-01-25 DIAGNOSIS — R4781 Slurred speech: Secondary | ICD-10-CM | POA: Diagnosis present

## 2019-01-25 DIAGNOSIS — I10 Essential (primary) hypertension: Secondary | ICD-10-CM | POA: Diagnosis present

## 2019-01-25 DIAGNOSIS — R4701 Aphasia: Secondary | ICD-10-CM

## 2019-01-25 DIAGNOSIS — R1310 Dysphagia, unspecified: Secondary | ICD-10-CM | POA: Diagnosis present

## 2019-01-25 DIAGNOSIS — I6340 Cerebral infarction due to embolism of unspecified cerebral artery: Principal | ICD-10-CM | POA: Diagnosis present

## 2019-01-25 DIAGNOSIS — R29704 NIHSS score 4: Secondary | ICD-10-CM | POA: Diagnosis not present

## 2019-01-25 DIAGNOSIS — R29705 NIHSS score 5: Secondary | ICD-10-CM | POA: Diagnosis not present

## 2019-01-25 DIAGNOSIS — R29702 NIHSS score 2: Secondary | ICD-10-CM | POA: Diagnosis present

## 2019-01-25 DIAGNOSIS — Z7982 Long term (current) use of aspirin: Secondary | ICD-10-CM

## 2019-01-25 DIAGNOSIS — M810 Age-related osteoporosis without current pathological fracture: Secondary | ICD-10-CM | POA: Diagnosis present

## 2019-01-25 DIAGNOSIS — Z8249 Family history of ischemic heart disease and other diseases of the circulatory system: Secondary | ICD-10-CM

## 2019-01-25 DIAGNOSIS — N179 Acute kidney failure, unspecified: Secondary | ICD-10-CM | POA: Diagnosis present

## 2019-01-25 DIAGNOSIS — D751 Secondary polycythemia: Secondary | ICD-10-CM | POA: Diagnosis present

## 2019-01-25 DIAGNOSIS — D75839 Thrombocytosis, unspecified: Secondary | ICD-10-CM

## 2019-01-25 DIAGNOSIS — Z79899 Other long term (current) drug therapy: Secondary | ICD-10-CM

## 2019-01-25 DIAGNOSIS — Z96641 Presence of right artificial hip joint: Secondary | ICD-10-CM | POA: Diagnosis present

## 2019-01-25 DIAGNOSIS — R29810 Facial weakness: Secondary | ICD-10-CM | POA: Diagnosis present

## 2019-01-25 DIAGNOSIS — I63411 Cerebral infarction due to embolism of right middle cerebral artery: Secondary | ICD-10-CM

## 2019-01-25 DIAGNOSIS — C946 Myelodysplastic disease, not classified: Secondary | ICD-10-CM | POA: Diagnosis present

## 2019-01-25 DIAGNOSIS — J9601 Acute respiratory failure with hypoxia: Secondary | ICD-10-CM | POA: Diagnosis present

## 2019-01-25 DIAGNOSIS — I6930 Unspecified sequelae of cerebral infarction: Secondary | ICD-10-CM

## 2019-01-25 DIAGNOSIS — Z86711 Personal history of pulmonary embolism: Secondary | ICD-10-CM

## 2019-01-25 DIAGNOSIS — I639 Cerebral infarction, unspecified: Secondary | ICD-10-CM | POA: Diagnosis present

## 2019-01-25 LAB — CBC
HCT: 53.6 % — ABNORMAL HIGH (ref 36.0–46.0)
Hemoglobin: 16.7 g/dL — ABNORMAL HIGH (ref 12.0–15.0)
MCH: 27.4 pg (ref 26.0–34.0)
MCHC: 31.2 g/dL (ref 30.0–36.0)
MCV: 87.9 fL (ref 80.0–100.0)
Platelets: 1024 10*3/uL (ref 150–400)
RBC: 6.1 MIL/uL — ABNORMAL HIGH (ref 3.87–5.11)
RDW: 16.5 % — ABNORMAL HIGH (ref 11.5–15.5)
WBC: 8.5 10*3/uL (ref 4.0–10.5)
nRBC: 0 % (ref 0.0–0.2)

## 2019-01-25 LAB — COMPREHENSIVE METABOLIC PANEL
ALT: 15 U/L (ref 0–44)
AST: 32 U/L (ref 15–41)
Albumin: 4.2 g/dL (ref 3.5–5.0)
Alkaline Phosphatase: 59 U/L (ref 38–126)
Anion gap: 11 (ref 5–15)
BUN: 19 mg/dL (ref 8–23)
CO2: 26 mmol/L (ref 22–32)
Calcium: 9.8 mg/dL (ref 8.9–10.3)
Chloride: 102 mmol/L (ref 98–111)
Creatinine, Ser: 0.96 mg/dL (ref 0.44–1.00)
GFR calc Af Amer: >60 mL/min (ref >60)
GFR calc non Af Amer: 52 mL/min — ABNORMAL LOW (ref >60)
Glucose, Bld: 106 mg/dL — ABNORMAL HIGH (ref 70–99)
Potassium: 4.1 mmol/L (ref 3.5–5.1)
Sodium: 139 mmol/L (ref 135–145)
Total Bilirubin: 0.8 mg/dL (ref 0.3–1.2)
Total Protein: 7.5 g/dL (ref 6.5–8.1)

## 2019-01-25 LAB — DIFFERENTIAL
Abs Immature Granulocytes: 0.03 10*3/uL (ref 0.00–0.07)
Basophils Absolute: 0.1 10*3/uL (ref 0.0–0.1)
Basophils Relative: 1 %
Eosinophils Absolute: 0.2 10*3/uL (ref 0.0–0.5)
Eosinophils Relative: 2 %
Immature Granulocytes: 0 %
Lymphocytes Relative: 14 %
Lymphs Abs: 1.2 10*3/uL (ref 0.7–4.0)
Monocytes Absolute: 0.6 10*3/uL (ref 0.1–1.0)
Monocytes Relative: 7 %
Neutro Abs: 6.4 10*3/uL (ref 1.7–7.7)
Neutrophils Relative %: 76 %

## 2019-01-25 LAB — PROTIME-INR
INR: 0.9 (ref 0.8–1.2)
Prothrombin Time: 12 seconds (ref 11.4–15.2)

## 2019-01-25 LAB — ETHANOL: Alcohol, Ethyl (B): <10 mg/dL (ref <10)

## 2019-01-25 LAB — APTT: aPTT: 42 seconds — ABNORMAL HIGH (ref 24–36)

## 2019-01-25 LAB — I-STAT CREATININE, ED: Creatinine, Ser: 0.9 mg/dL (ref 0.44–1.00)

## 2019-01-25 NOTE — ED Provider Notes (Signed)
[place] DEPT Provider Note   CSN: [id number] Arrival date & time: 01/25/19  2154    History   Chief Complaint Chief Complaint  Patient presents with   Aphasia    HPI Jill Castaneda is a 85 y.o. female.     HPI   85 year old female with dysarthria.  There is a language barrier.  Patient is primarily [place].  History is via her daughter.  She reports last known normal at 9 PM this evening.  Daughter found patient slumped forward in a seated position drooling.  She was awake.  Speech is very dysarthric.  Her cognition seemed to be intact though.  Patient lives at home.  She walks unassisted.  She feeds herself and changes or so.  She does need some assistance with bathing because of a fall risk.  She has had prior strokes and has a disconjugate gaze and some coordination issues with the right upper extremity since that time.  Daughter has not noticed any other deficits.  Patient herself has no complaints.  She denies any acute pain.  She denies any acute numbness, tingling or focal loss of strength.  Past Medical History:  Diagnosis Date   Cataract    Complication of anesthesia    Difficulty waking up "   Coronary artery disease    Hip fracture ([place]) 06/2017   right hip   Hypertension    Osteoporosis    Stroke [facility])     Patient Active Problem List   Diagnosis Date Noted   Closed displaced fracture of right femoral neck ([place])    Hypokalemia    Thrombocytosis ([place]) 06/20/2017   Displaced fracture of right femoral neck ([place]) 06/20/2017   NSTEMI (non-ST elevated myocardial infarction) ([place]) 04/15/2014   Non-STEMI (non-ST elevated myocardial infarction) ([place]) 04/13/2014   Hypertension    CVA (cerebral infarction) 03/08/2014    Past Surgical History:  Procedure Laterality Date   ANTERIOR APPROACH HEMI HIP ARTHROPLASTY Right 06/20/2017   Procedure: ANTERIOR APPROACH HEMI HIP ARTHROPLASTY;  Surgeon: Rod Can,  MD;  Location: [place];  Service: Orthopedics;  Laterality: Right;   FRACTURE SURGERY     SHOULDER HARDWARE REMOVAL Left    SHOULDER HEMI-ARTHROPLASTY Left    TOTAL HIP ARTHROPLASTY Right      OB History   No obstetric history on file.      Home Medications    Prior to Admission medications   Medication Sig Start Date End Date Taking? Authorizing Provider  amLODipine (NORVASC) 2.5 MG tablet TAKE ONE TABLET BY MOUTH ONCE DAILY Patient taking differently: TAKE 2.5 mg TABLET BY MOUTH ONCE DAILY 02/22/17   Harrison Mons, PA  aspirin 81 MG chewable tablet Chew 1 tablet (81 mg total) by mouth 2 (two) times daily with a meal. 06/21/17   Swinteck, Aaron Edelman, MD  enoxaparin (LOVENOX) 150 MG/ML injection Inject 0.47 mLs (70 mg total) into the skin daily. 06/23/17   Charlesetta Shanks, MD  HYDROcodone-acetaminophen (NORCO/VICODIN) 5-325 MG tablet Take 1 tablet by mouth every 6 (six) hours as needed for moderate pain. 06/22/17   Dhungel, Flonnie Overman, MD  methocarbamol (ROBAXIN) 500 MG tablet Take 1 tablet (500 mg total) by mouth every 8 (eight) hours as needed for muscle spasms. 06/22/17   Dhungel, Flonnie Overman, MD  Multiple Vitamin (MULTIVITAMIN WITH MINERALS) TABS tablet Take 1 tablet by mouth once a week.     [provider]  omega-3 acid ethyl esters (LOVAZA) 1 g capsule Take 1 g by mouth daily.  [provider]  polyethylene glycol (MIRALAX / GLYCOLAX) packet Take 17 g by mouth daily. 06/22/17   Dhungel, Flonnie Overman, MD    Family History Family History  Problem Relation Age of Onset   Hypertension Father    Heart disease Father    Hypertension Sister    Heart disease Sister    Stroke Mother    Cancer Brother     Social History Social History   Tobacco Use   Smoking status: Never Smoker   Smokeless tobacco: Never Used  Substance Use Topics   Alcohol use: No   Drug use: No     Allergies   Patient has no known allergies.   Review of Systems Review of  Systems  All systems reviewed and negative, other than as noted in HPI.  Physical Exam Updated Vital Signs There were no vitals taken for this visit.  Physical Exam Vitals signs and nursing note reviewed.  Constitutional:      General: She is not in acute distress.    Appearance: She is well-developed.  HENT:     Head: Normocephalic and atraumatic.  Eyes:     General:        Right eye: No discharge.        Left eye: No discharge.     Conjunctiva/sclera: Conjunctivae normal.  Neck:     Musculoskeletal: Neck supple.  Cardiovascular:     Rate and Rhythm: Normal rate and regular rhythm.     Heart sounds: Normal heart sounds. No murmur. No friction rub. No gallop.   Pulmonary:     Effort: Pulmonary effort is normal. No respiratory distress.     Breath sounds: Normal breath sounds.  Abdominal:     General: There is no distension.     Palpations: Abdomen is soft.     Tenderness: There is no abdominal tenderness.  Musculoskeletal:        General: No tenderness.  Skin:    General: Skin is warm and dry.  Neurological:     Mental Status: She is alert.     Comments: Patient is alert.  Per daughter's report, she is oriented x3.  She follows commands.  She has a mildly disconjugate gaze but extraocular muscle function appears to be intact.  Tremor noted with right upper extremity but strength is 5 out of 5 bilateral upper and lower extremities.  Psychiatric:        Behavior: Behavior normal.        Thought Content: Thought content normal.      ED Treatments / Results  Labs (all labs ordered are listed, but only abnormal results are displayed) Labs Reviewed  APTT - Abnormal; Notable for the following components:      Result Value   aPTT 42 (*)    All other components within normal limits  CBC - Abnormal; Notable for the following components:   RBC 6.10 (*)    Hemoglobin 16.7 (*)    HCT 53.6 (*)    RDW 16.5 (*)    All other components within normal limits  COMPREHENSIVE  METABOLIC PANEL - Abnormal; Notable for the following components:   Glucose, Bld 106 (*)    GFR calc non Af Amer 52 (*)    All other components within normal limits  ETHANOL  PROTIME-INR  DIFFERENTIAL  URINALYSIS, ROUTINE W REFLEX MICROSCOPIC  I-STAT CREATININE, ED    EKG None  Radiology Ct Head Code Stroke Wo Contrast  Result Date: 01/25/2019 CLINICAL DATA:  Code  stroke. 85 year old female with slurred speech and right side weakness. EXAM: CT HEAD WITHOUT CONTRAST TECHNIQUE: Contiguous axial images were obtained from the base of the skull through the vertex without intravenous contrast. COMPARISON:  Head CT 08/18/2014. Brain MRI 03/09/2014. FINDINGS: Brain: Cerebral volume is not significantly changed. No ventriculomegaly. No acute intracranial hemorrhage identified. No midline shift, mass effect, or evidence of intracranial mass lesion. 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 Small areas of cortical and subcortical white matter hypodensity/encephalomalacia in the posterior left MCA territory on series 2, image 20 and 18 appear increased but not brand new. There is a similar chronic appearing much smaller right parietal cortical infarct best seen on sagittal image 8. No other cortically based infarct identified. Vascular: Tortuous. Calcified atherosclerosis at the skull base. No suspicious intracranial vascular hyperdensity. Skull: No acute osseous abnormality identified. Sinuses/Orbits: Visualized paranasal sinuses and mastoids are stable and well pneumatized. Other: Small nonspecific left forehead scalp soft tissue polypoid lesion on series 3, image 52 is new since 2015 but probably benign. Interval postoperative changes to both globes. ASPECTS [facility] Stroke Program Early CT Score) - Ganglionic level infarction (caudate, lentiform nuclei, internal  capsule, insula, M1-M3 cortex): - Supraganglionic infarction (M4-M6 cortex): Total score (0-10 with 10 being normal): IMPRESSION: 1. Advanced chronic ischemic disease with progression in both cerebral and cerebellar hemispheres since 2015. Several bilateral areas are age indeterminate. 2. No acute intracranial hemorrhage or mass effect. 3.  No suspicious intracranial vascular hyperdensity. Study discussed by telephone with Dr. Virgel Manifold on 01/25/2019 at 22:40 . Electronically Signed   By: Genevie Ann M.D.   On: 01/25/2019 22:41    Procedures Procedures (including critical care time)  Medications Ordered in ED Medications - No data to display   Initial Impression / Assessment and Plan / ED Course  I have reviewed the triage vital signs and the nursing notes.  Pertinent labs & imaging results that were available during my care of the patient were reviewed by me and considered in my medical decision making (see chart for details).  85 year old female with dysarthria per daughter.  Exam is hindered by language barrier.  Patient speaks [place] only.  Her daughter reports that her speech continues to sound dysarthric although improved from earlier.  She states that she does not seem confused.  Questionable new right-sided facial weakness.  No symptoms or other our only acute neurologic findings.  She was made a code stroke and evaluated by teleneurology. Neurology felt that symptoms not secondary to CVA. Will w/u for other possible cause of symptoms.   Final Clinical Impressions(s) / ED Diagnoses   Final diagnoses:  Generalized weakness    Dysarthria  Thrombocytosis [facility])    ED Discharge Orders    None       Virgel Manifold, MD 01/29/19 [phone]

## 2019-01-25 NOTE — Consult Note (Signed)
[facility] TeleNeurology Consult Services   Date of Service:   01/25/2019 22:29:53  Impression:     .  Generalized weakness  Comments/Sign-Out: Generalized weakness, history and exam not suggestive of stroke. Work up for systemic process if none identified consider MRI brain and EEG. Check orthostatic vitals. Labs pending. CT nothing acute.  Metrics: Last Known Well: 01/25/2019 20:30:00 [facility] Notification Time: 01/25/2019 22:29:53 Arrival Time: 01/25/2019 21:54:00 Stamp Time: 01/25/2019 22:29:53 Time First Login Attempt: 01/25/2019 22:32:00 Video Start Time: 01/25/2019 22:32:00  Symptoms: generalized weakness and trouble speaking NIHSS Start Assessment Time: 01/25/2019 22:36:10 Patient is not a candidate for tPA. Patient was not deemed candidate for tPA thrombolytics because of Presentation not consistent with acute stroke. Video End Time: 01/25/2019 22:48:21  CT head showed no acute hemorrhage or acute core infarct.  Clinical Presentation is not Suggestive of Large Vessel Occlusive Disease, Patient is not a Candidate for Thrombectomy  ED Physician notified of diagnostic impression and management plan on 01/25/2019 22:51:26  Our recommendations are outlined below.  Recommendations:     .  Activate Stroke Protocol Admission/Order Set     .  Stroke/Telemetry Floor     .  Neuro Checks     .  Bedside Swallow Eval     .  DVT Prophylaxis     .  IV Fluids, Normal Saline     .  Head of Bed Below 30 Degrees     .  Euglycemia and Avoid Hyperthermia (PRN Acetaminophen)     .  Initiate Aspirin 81 MG Daily  Routine Consultation with [facility] for Follow up Care    ------------------------------------------------------------------------------  History of Present Illness: Patient is a 85 year old Female, RH.  Patient was brought by private transportation with symptoms of generalized weakness and trouble speaking  [place] speaking, prior  stroke, prior PE, not on anticoagulation. Pre granddaughter cold today and unwell, at 2100 slumped over, depressed LOA, drooling, now weak and slow speech, not slurred. Trouble swallowing. No sore throat.  CT head showed no acute hemorrhage or acute core infarct.   Examination: Alert, oriented to self, month, place. Follows commands.  Granddaughter translates and reports speech clear but slow. Moves 4 extremities antigravity No gaze or face palsy. 1A: Level of Consciousness - Alert; keenly responsive + 0 1B: Ask Month and Age - Both Questions Right + 0 1C: Blink Eyes & Squeeze Hands - Performs Both Tasks + 0 2: Test Horizontal Extraocular Movements - Normal + 0 3: Test Visual Fields - No Visual Loss + 0 4: Test Facial Palsy (Use Grimace if Obtunded) - Normal symmetry + 0 5A: Test Left Arm Motor Drift - No Drift for 10 Seconds + 0 5B: Test Right Arm Motor Drift - No Drift for 10 Seconds + 0 6A: Test Left Leg Motor Drift - No Drift for 5 Seconds + 0 6B: Test Right Leg Motor Drift - No Drift for 5 Seconds + 0 7: Test Limb Ataxia (FNF/Heel-Shin) - No Ataxia + 0 8: Test Sensation - Normal; No sensory loss + 0 9: Test Language/Aphasia - Mild-Moderate Aphasia: Some Obvious Changes, Without Significant Limitation + 1 10: Test Dysarthria - Normal + 0 11: Test Extinction/Inattention - No abnormality + 0  NIHSS Score: 1  Patient was informed the Neurology Consult would happen via TeleHealth consult by way of interactive audio and video telecommunications and consented to receiving care in this manner.  Due to the immediate potential for life-threatening deterioration due to underlying acute  neurologic illness, I spent 35 minutes providing critical care. This time includes time for face to face visit via telemedicine, review of medical records, imaging studies and discussion of findings with providers, the patient and/or family.   Dr Lita Mains   [facility] [phone]   Case  [id number]

## 2019-01-25 NOTE — ED Triage Notes (Signed)
Granddaughter states she began having slurred speech about 45 minutes ago, she has hx of strokes and an MI, pt is not a candidate for TPA per granddaughter

## 2019-01-25 NOTE — ED Notes (Signed)
Attempted to collect urine sample, but stool contaminated sample. Will apply purewick and attempt again.

## 2019-01-26 ENCOUNTER — Inpatient Hospital Stay (HOSPITAL_COMMUNITY): Payer: Medicaid Other

## 2019-01-26 ENCOUNTER — Encounter (HOSPITAL_COMMUNITY): Payer: Self-pay | Admitting: Family Medicine

## 2019-01-26 ENCOUNTER — Inpatient Hospital Stay (HOSPITAL_COMMUNITY): Payer: Self-pay

## 2019-01-26 ENCOUNTER — Other Ambulatory Visit: Payer: Self-pay

## 2019-01-26 ENCOUNTER — Observation Stay (HOSPITAL_COMMUNITY): Payer: Medicaid Other

## 2019-01-26 DIAGNOSIS — I2510 Atherosclerotic heart disease of native coronary artery without angina pectoris: Secondary | ICD-10-CM | POA: Diagnosis not present

## 2019-01-26 DIAGNOSIS — R531 Weakness: Secondary | ICD-10-CM | POA: Diagnosis present

## 2019-01-26 DIAGNOSIS — I639 Cerebral infarction, unspecified: Secondary | ICD-10-CM

## 2019-01-26 DIAGNOSIS — R4701 Aphasia: Secondary | ICD-10-CM | POA: Diagnosis not present

## 2019-01-26 DIAGNOSIS — I6340 Cerebral infarction due to embolism of unspecified cerebral artery: Secondary | ICD-10-CM | POA: Diagnosis not present

## 2019-01-26 DIAGNOSIS — R471 Dysarthria and anarthria: Secondary | ICD-10-CM | POA: Diagnosis present

## 2019-01-26 DIAGNOSIS — R29705 NIHSS score 5: Secondary | ICD-10-CM | POA: Diagnosis not present

## 2019-01-26 DIAGNOSIS — I452 Bifascicular block: Secondary | ICD-10-CM | POA: Diagnosis not present

## 2019-01-26 DIAGNOSIS — R1310 Dysphagia, unspecified: Secondary | ICD-10-CM | POA: Diagnosis not present

## 2019-01-26 DIAGNOSIS — I6930 Unspecified sequelae of cerebral infarction: Secondary | ICD-10-CM

## 2019-01-26 DIAGNOSIS — N179 Acute kidney failure, unspecified: Secondary | ICD-10-CM | POA: Diagnosis not present

## 2019-01-26 DIAGNOSIS — D473 Essential (hemorrhagic) thrombocythemia: Secondary | ICD-10-CM | POA: Diagnosis not present

## 2019-01-26 DIAGNOSIS — J9601 Acute respiratory failure with hypoxia: Secondary | ICD-10-CM | POA: Diagnosis not present

## 2019-01-26 DIAGNOSIS — M810 Age-related osteoporosis without current pathological fracture: Secondary | ICD-10-CM | POA: Diagnosis not present

## 2019-01-26 DIAGNOSIS — R29703 NIHSS score 3: Secondary | ICD-10-CM | POA: Diagnosis not present

## 2019-01-26 DIAGNOSIS — Z66 Do not resuscitate: Secondary | ICD-10-CM | POA: Diagnosis not present

## 2019-01-26 DIAGNOSIS — D751 Secondary polycythemia: Secondary | ICD-10-CM | POA: Diagnosis not present

## 2019-01-26 DIAGNOSIS — R29810 Facial weakness: Secondary | ICD-10-CM | POA: Diagnosis not present

## 2019-01-26 DIAGNOSIS — I10 Essential (primary) hypertension: Secondary | ICD-10-CM | POA: Diagnosis not present

## 2019-01-26 DIAGNOSIS — R29701 NIHSS score 1: Secondary | ICD-10-CM | POA: Diagnosis not present

## 2019-01-26 DIAGNOSIS — E785 Hyperlipidemia, unspecified: Secondary | ICD-10-CM | POA: Diagnosis not present

## 2019-01-26 DIAGNOSIS — G8194 Hemiplegia, unspecified affecting left nondominant side: Secondary | ICD-10-CM | POA: Diagnosis not present

## 2019-01-26 DIAGNOSIS — C946 Myelodysplastic disease, not classified: Secondary | ICD-10-CM | POA: Diagnosis not present

## 2019-01-26 DIAGNOSIS — R29704 NIHSS score 4: Secondary | ICD-10-CM | POA: Diagnosis not present

## 2019-01-26 DIAGNOSIS — R29702 NIHSS score 2: Secondary | ICD-10-CM | POA: Diagnosis not present

## 2019-01-26 DIAGNOSIS — R4781 Slurred speech: Secondary | ICD-10-CM | POA: Diagnosis not present

## 2019-01-26 LAB — URINALYSIS, ROUTINE W REFLEX MICROSCOPIC
Bilirubin Urine: NEGATIVE
Glucose, UA: NEGATIVE mg/dL
Hgb urine dipstick: NEGATIVE
Ketones, ur: 5 mg/dL — AB
Leukocytes,Ua: NEGATIVE
Nitrite: NEGATIVE
Protein, ur: NEGATIVE mg/dL
Specific Gravity, Urine: 1.006 (ref 1.005–1.030)
pH: 8 (ref 5.0–8.0)

## 2019-01-26 LAB — LIPID PANEL
Cholesterol: 234 mg/dL — ABNORMAL HIGH (ref 0–200)
HDL: 62 mg/dL (ref >40)
LDL Cholesterol: 154 mg/dL — ABNORMAL HIGH (ref 0–99)
TRIGLYCERIDES: 88 mg/dL (ref <150)
Total CHOL/HDL Ratio: 3.8 RATIO
VLDL: 18 mg/dL (ref 0–40)

## 2019-01-26 LAB — CK: Total CK: 42 U/L (ref 38–234)

## 2019-01-26 LAB — ECHOCARDIOGRAM LIMITED BUBBLE STUDY
Height: 56 in
WEIGHTICAEL: 1590.84 [oz_av]

## 2019-01-26 LAB — HEMOGLOBIN A1C
Hgb A1c MFr Bld: 5.8 % — ABNORMAL HIGH (ref 4.8–5.6)
Mean Plasma Glucose: 119.76 mg/dL

## 2019-01-26 LAB — VITAMIN B12: Vitamin B-12: 359 pg/mL (ref 180–914)

## 2019-01-26 LAB — TSH: TSH: 7.553 u[IU]/mL — ABNORMAL HIGH (ref 0.350–4.500)

## 2019-01-26 LAB — T4, FREE: Free T4: 1 ng/dL (ref 0.82–1.77)

## 2019-01-26 MED ORDER — ACETAMINOPHEN 650 MG RE SUPP: 650.0000 mg | RECTAL | Status: DC | PRN

## 2019-01-26 MED ORDER — ACETAMINOPHEN 325 MG PO TABS: 650.0000 mg | ORAL_TABLET | ORAL | Status: DC | PRN

## 2019-01-26 MED ORDER — LABETALOL HCL 5 MG/ML IV SOLN: 5.0000 mg | INTRAVENOUS | Status: DC | PRN

## 2019-01-26 MED ORDER — ASPIRIN 300 MG RE SUPP: 300.0000 mg | Freq: Every day | RECTAL | Status: DC

## 2019-01-26 MED ORDER — SODIUM CHLORIDE 0.9 % IV SOLN
INTRAVENOUS | Status: DC
  Administered 2019-01-26: 05:00:00 via INTRAVENOUS

## 2019-01-26 MED ORDER — CLOPIDOGREL BISULFATE 75 MG PO TABS
75.0000 mg | ORAL_TABLET | Freq: Every day | ORAL | Status: DC
  Administered 2019-01-27 – 2019-01-29 (×3): 75 mg via ORAL
  Filled 2019-01-26 (×3): qty 1

## 2019-01-26 MED ORDER — STROKE: EARLY STAGES OF RECOVERY BOOK
Freq: Once | Status: AC
  Administered 2019-01-26: 05:00:00

## 2019-01-26 MED ORDER — PERFLUTREN LIPID MICROSPHERE
1.0000 mL | INTRAVENOUS | Status: AC | PRN
  Administered 2019-01-26: 5 mL via INTRAVENOUS
  Filled 2019-01-26: qty 10

## 2019-01-26 MED ORDER — ENOXAPARIN SODIUM 40 MG/0.4ML ~~LOC~~ SOLN: 40.0000 mg | SUBCUTANEOUS | Status: DC

## 2019-01-26 MED ORDER — ATORVASTATIN CALCIUM 40 MG PO TABS
40.0000 mg | ORAL_TABLET | Freq: Every day | ORAL | Status: DC
  Administered 2019-01-26 – 2019-01-28 (×3): 40 mg via ORAL
  Filled 2019-01-26 (×3): qty 1

## 2019-01-26 MED ORDER — ASPIRIN 325 MG PO TABS
325.0000 mg | ORAL_TABLET | Freq: Every day | ORAL | Status: DC
  Administered 2019-01-26: 325 mg via ORAL
  Filled 2019-01-26: qty 1

## 2019-01-26 MED ORDER — STROKE: EARLY STAGES OF RECOVERY BOOK
Freq: Once | Status: AC
  Administered 2019-01-26: 14:00:00

## 2019-01-26 MED ORDER — ASPIRIN EC 81 MG PO TBEC
81.0000 mg | DELAYED_RELEASE_TABLET | Freq: Every day | ORAL | Status: DC
  Administered 2019-01-27 – 2019-01-29 (×3): 81 mg via ORAL
  Filled 2019-01-26 (×3): qty 1

## 2019-01-26 MED ORDER — ACETAMINOPHEN 160 MG/5ML PO SOLN: 650.0000 mg | ORAL | Status: DC | PRN

## 2019-01-26 MED ORDER — SENNOSIDES-DOCUSATE SODIUM 8.6-50 MG PO TABS: 1.0000 | ORAL_TABLET | Freq: Every evening | ORAL | Status: DC | PRN

## 2019-01-26 MED ORDER — ENOXAPARIN SODIUM 30 MG/0.3ML ~~LOC~~ SOLN
30.0000 mg | SUBCUTANEOUS | Status: DC
  Administered 2019-01-26 – 2019-01-29 (×4): 30 mg via SUBCUTANEOUS
  Filled 2019-01-26 (×5): qty 0.3

## 2019-01-26 MED ORDER — ASPIRIN 300 MG RE SUPP
300.0000 mg | RECTAL | Status: AC
  Administered 2019-01-26: 300 mg via RECTAL
  Filled 2019-01-26: qty 1

## 2019-01-26 MED ORDER — IOHEXOL 350 MG/ML SOLN
50.0000 mL | Freq: Once | INTRAVENOUS | Status: AC | PRN
  Administered 2019-01-26: 50 mL via INTRAVENOUS

## 2019-01-26 MED ORDER — RESOURCE THICKENUP CLEAR PO POWD
ORAL | Status: DC | PRN
  Filled 2019-01-26: qty 125

## 2019-01-26 NOTE — Procedures (Signed)
Echo attempted. Patient with speech therapy after returning from imaging. Will attempt later.

## 2019-01-26 NOTE — Progress Notes (Signed)
Pt admitted from [facility] ED with stroke diagnosis, pt alert, speaks [place] only, grand daughter at bedside, settled in bed with call light at bedside, tele monitor put and verified on pt, Neurologist on call paged and notified of pt's arrival, was however reassured and will continue to monitor, safety concern addressed accordingly. Obasogie-Asidi, Katoya Amato Efe

## 2019-01-26 NOTE — Discharge Summary (Deleted)
Ms. Beckum wants to go home despite our recommendation for admission to [facility] for further evaluation of gen weakness and speech change with concern for acute CVA. Her granddaughter is here, agrees with our recommendation for admission, but is supportive of the patient's decision to go back home. Ms. Borromeo is fully oriented and she and her family understand our concerns, particularly given failure of swallow screen in ED. They plan to leave AMA.

## 2019-01-26 NOTE — Progress Notes (Signed)
Patient off the floor for MRA.

## 2019-01-26 NOTE — TOC Initial Note (Signed)
Transition of Care [facility]) - Initial/Assessment Note    Patient Details  Name: Tashanti Dalporto MRN: [id number] Date of Birth: [birth year]  [facility] CM/SW Contact:    Pollie Friar, RN Phone Number: 01/26/2019, 11:48 AM  Clinical Narrative:                 Pt ambulatory at home per Granddaughter who is a PT. May need medication assist at d/c depending on the meds.   Expected Discharge Plan: [place] Barriers to Discharge: Continued Medical Work up   Patient Goals and CMS Choice        Expected Discharge Plan and Services Expected Discharge Plan: [place]   Discharge Planning Services: CM Consult   Living arrangements for the past 2 months: Single Family Home                          Prior Living Arrangements/Services Living arrangements for the past 2 months: Single Family Home Lives with:: Other (Comment)(adult granddaughter) Patient language and need for interpreter reviewed:: Yes(pt speaks russian) Do you feel safe going back to the place where you live?: Yes      Need for Family Participation in Patient Care: Yes (Comment) Care giver support system in place?: Yes (comment)(Granddaughter states she has 24 hour supervision) Current home services: DME(wheelchair, walker, cane, shower seat, 3 n 1) Criminal Activity/Legal Involvement Pertinent to Current Situation/Hospitalization: No - Comment as needed  Activities of Daily Living Home Assistive Devices/Equipment: None ADL Screening (condition at time of admission) Patient's cognitive ability adequate to safely complete daily activities?: Yes Is the patient deaf or have difficulty hearing?: No Does the patient have difficulty seeing, even when wearing glasses/contacts?: No Does the patient have difficulty concentrating, remembering, or making decisions?: No Patient able to express need for assistance with ADLs?: Yes(Needs Office manager) Does the patient have  difficulty dressing or bathing?: Yes Independently performs ADLs?: No Communication: Needs assistance Is this a change from baseline?: Pre-admission baseline Dressing (OT): Needs assistance Is this a change from baseline?: Change from baseline, expected to last <3days Grooming: Needs assistance Is this a change from baseline?: Change from baseline, expected to last <3 days Feeding: Independent Bathing: Needs assistance Is this a change from baseline?: Change from baseline, expected to last <3 days Toileting: Needs assistance Is this a change from baseline?: Change from baseline, expected to last <3 days In/Out Bed: Needs assistance Is this a change from baseline?: Change from baseline, expected to last <3 days Walks in Home: Independent Does the patient have difficulty walking or climbing stairs?: Yes Weakness of Legs: Right Weakness of Arms/Hands: Right  Permission Sought/Granted                  Emotional Assessment Appearance:: Appears older than stated age Attitude/Demeanor/Rapport: Unable to Assess(Granddaughter answered questions for patient) Affect (typically observed): Unable to Assess Orientation: : Oriented to Self(per RN notes)   Psych Involvement: No (comment)  Admission diagnosis:  Aphasia [R47.01] Thrombocytosis ([place]) [D47.3] Dysarthria [R47.1] Generalized weakness [R53.1] Patient Active Problem List   Diagnosis Date Noted  . Dysarthria 01/26/2019  . Coronary artery disease 01/26/2019  . History of stroke with residual deficit 01/26/2019  . CVA (cerebral vascular accident) ([place]) 01/26/2019  . Generalized weakness   . Closed displaced fracture of right femoral neck ([place])   . Hypokalemia   . Thrombocytosis ([place]) 06/20/2017  . Displaced fracture of right  femoral neck ([place]) 06/20/2017  . NSTEMI (non-ST elevated myocardial infarction) ([place]) 04/15/2014  . Non-STEMI (non-ST elevated myocardial infarction) ([place]) 04/13/2014  . Hypertension   . CVA (cerebral  infarction) 03/08/2014   PCP: [facility] [place]:   [place], [place] - [address] [place] [address] Phone: [phone] Fax: [phone]     Social Determinants of Health (SDOH) Interventions  Pt doesn't have insurance. Medications have been affordable per granddaughter.  Family provides needed transportation.  Readmission Risk Interventions No flowsheet data found.

## 2019-01-26 NOTE — ED Provider Notes (Signed)
Patient has profound thrombocytosis with failed swallowing screen.    Case d/w Dr. Lorraine Lax who believes patient needs MRI and additional work up.  [facility].    Will start rectal ASA for thrombocytosis in the setting of dysarthria with failed swallow screen    [place] , MD 01/26/19 [id number]

## 2019-01-26 NOTE — Evaluation (Addendum)
Physical Therapy Evaluation Patient Details Name: Jill Castaneda MRN: [id number] DOB: 1928/04/22 Today's Date: 01/26/2019   History of Present Illness  Pt is an 85 y/o female admitted secondary to slurred speech. MRI of the brain revealed an acute infarct in the posterior limb of the internal capsule. PMH including but not limited to coronary artery disease, hypertension, left midbrain and left thalamic stroke in 2015 with some residual diplopia and right-sided tremor.    Clinical Impression  Pt presented supine in bed with HOB elevated, awake and willing to participate in therapy session. Pt's granddaughter was present throughout and requested to act as pt's interpreter. Prior to admission, pt ambulated with use of a RW and was independent with ADLs. Pt lives with her granddaughter who is a Chief Strategy Officer PT. She is able to provide 24/7 supervision/assistance if needed upon d/c. Pt currently requires max A for bed mobility, mod A x2 for transfers and min A x2 with RW to take side steps at EOB. Pt had just ambulated to and from bathroom prior to arrival of PT and was tired; therefore, further gait training was not performed. Pt is an excellent candidate for further intensive therapy services at [facility] to maximize her independence with functional mobility prior to returning home with family support. PT will continue to follow acutely to progress mobility as tolerated.     Follow Up Recommendations [facility]    Equipment Recommendations  None recommended by PT    Recommendations for Other Services Rehab consult     Precautions / Restrictions Precautions Precautions: Fall Restrictions Weight Bearing Restrictions: No      Mobility  Bed Mobility Overal bed mobility: Needs Assistance Bed Mobility: Supine to Sit;Sit to Supine     Supine to sit: Max assist Sit to supine: Max assist   General bed mobility comments: bed in flat position, assistance needed for movement of bilateral LEs onto and off of  bed, assistance needed for trunk management as well  Transfers Overall transfer level: Needs assistance Equipment used: Rolling walker (2 wheeled) Transfers: Sit to/from Stand Sit to Stand: Mod assist;+2 safety/equipment;+2 physical assistance         General transfer comment: assistance for stability with transition into standing from sitting EOB  Ambulation/Gait Ambulation/Gait assistance: Min assist;+2 safety/equipment;+2 physical assistance   Assistive device: Rolling walker (2 wheeled)       General Gait Details: pt able to take several side steps at EOB towards her L side with RW and min A x2 for safety and stability  Stairs            Wheelchair Mobility    Modified Rankin (Stroke Patients Only) Modified Rankin (Stroke Patients Only) Pre-Morbid Rankin Score: Moderate disability Modified Rankin: Moderately severe disability     Balance Overall balance assessment: Needs assistance Sitting-balance support: Feet supported Sitting balance-Leahy Scale: Fair     Standing balance support: Bilateral upper extremity supported Standing balance-Leahy Scale: Poor                               Pertinent Vitals/Pain Pain Assessment: No/denies pain    Home Living Family/patient expects to be discharged to:: Private residence Living Arrangements: Other relatives(granddaughter) Available Help at Discharge: Family;Available 24 hours/day Type of Home: House       Home Layout: One level Home Equipment: Walker - 2 wheels      Prior Function Level of Independence: Independent with assistive device(s)  Comments: ambulates with RW, dresses and bathes herself     Hand Dominance        Extremity/Trunk Assessment   Upper Extremity Assessment Upper Extremity Assessment: Generalized weakness    Lower Extremity Assessment Lower Extremity Assessment: Generalized weakness    Cervical / Trunk Assessment Cervical / Trunk Assessment:  Kyphotic  Communication   Communication: Expressive difficulties  Cognition Arousal/Alertness: Awake/alert Behavior During Therapy: Flat affect Overall Cognitive Status: Within Functional Limits for tasks assessed                                        General Comments      Exercises     Assessment/Plan    PT Assessment Patient needs continued PT services  PT Problem List Decreased strength;Decreased balance;Decreased mobility;Decreased coordination       PT Treatment Interventions DME instruction;Gait training;Stair training;Therapeutic exercise;Therapeutic activities;Functional mobility training;Balance training;Neuromuscular re-education;Patient/family education    PT Goals (Current goals can be found in the Care Plan section)  Acute Rehab PT Goals Patient Stated Goal: per granddaughter - to regain as much function as possible PT Goal Formulation: With family Time For Goal Achievement: 02/09/19 Potential to Achieve Goals: Good    Frequency Min 4X/week   Barriers to discharge        Co-evaluation               AM-PAC PT "6 Clicks" Mobility  Outcome Measure Help needed turning from your back to your side while in a flat bed without using bedrails?: Total Help needed moving from lying on your back to sitting on the side of a flat bed without using bedrails?: Total Help needed moving to and from a bed to a chair (including a wheelchair)?: A Lot Help needed standing up from a chair using your arms (e.g., wheelchair or bedside chair)?: A Lot Help needed to walk in hospital room?: A Lot Help needed climbing 3-5 steps with a railing? : Total 6 Click Score: 9    End of Session   Activity Tolerance: Patient limited by fatigue Patient left: in bed;with call bell/phone within reach;with family/visitor present Nurse Communication: Mobility status PT Visit Diagnosis: Muscle weakness (generalized) (M62.81)    Time: 1426-1450 PT Time Calculation  (min) (ACUTE ONLY): 24 min   Charges:   PT Evaluation $PT Eval Moderate Complexity: 1 Mod PT Treatments $Therapeutic Activity: 8-22 mins        Sherie Don, PT, DPT  Acute Rehabilitation Services Pager [phone] Office [place] 01/26/2019, 3:54 PM

## 2019-01-26 NOTE — Evaluation (Signed)
Speech Language Pathology Evaluation Patient Details Name: Jill Castaneda MRN: [id number] DOB: 01/02/1928 Today's Date: 01/26/2019 Time: 1962-2297 SLP Time Calculation (min) (ACUTE ONLY): 17 min  Problem List:  Patient Active Problem List   Diagnosis Date Noted  . Dysarthria 01/26/2019  . Coronary artery disease 01/26/2019  . History of stroke with residual deficit 01/26/2019  . CVA (cerebral vascular accident) ([place]) 01/26/2019  . Generalized weakness   . Closed displaced fracture of right femoral neck ([place])   . Hypokalemia   . Thrombocytosis ([place]) 06/20/2017  . Displaced fracture of right femoral neck ([place]) 06/20/2017  . NSTEMI (non-ST elevated myocardial infarction) ([place]) 04/15/2014  . Non-STEMI (non-ST elevated myocardial infarction) ([place]) 04/13/2014  . Hypertension   . CVA (cerebral infarction) 03/08/2014   Past Medical History:  Past Medical History:  Diagnosis Date  . Cataract   . Complication of anesthesia    Difficulty waking up "  . Coronary artery disease   . Hip fracture ([place]) 06/2017   right hip  . Hypertension   . Osteoporosis   . Stroke [facility])    Past Surgical History:  Past Surgical History:  Procedure Laterality Date  . ANTERIOR APPROACH HEMI HIP ARTHROPLASTY Right 06/20/2017   Procedure: ANTERIOR APPROACH HEMI HIP ARTHROPLASTY;  Surgeon: Rod Can, MD;  Location: [place];  Service: Orthopedics;  Laterality: Right;  . FRACTURE SURGERY    . SHOULDER HARDWARE REMOVAL Left   . SHOULDER HEMI-ARTHROPLASTY Left   . TOTAL HIP ARTHROPLASTY Right    HPI:  Pt is an 85 year old female who presents with slurred speech and difficulty swallowing. MRI showed  a new posterior limb of the internal capsule stroke on the right as well as a small area of cortical stroke in the right frontal cortex. PMH includes: CAD, HTN, CVA (with expressive aphasia that improved almost back to baseline per granddaughter, no other SLP needs at that time)   Assessment / Plan /  Recommendation Clinical Impression  Pt was seen with the help of her granddaughter translating, who reports that her speech is slurred and slow compared to baseline. Vocal quality is very soft, which likely further complicates intelligibility. Although her granddaughter does appear to understand what the pt says well, speech appears quite effortful. Other than her speech, her gradndaughter believes she is at her cognitive-linguistic baseline, which does include mild expressive language deficits since prior CVA. She follows one-step commands well, is oriented x4, and demonstrated appropriate recall, awareness, and reasoning related to current hospitalization. Will continue to follow for dysarthria (and swallowing) at this time,    SLP Assessment  SLP Recommendation/Assessment: Patient needs continued Speech Lanaguage Pathology Services SLP Visit Diagnosis: Dysarthria and anarthria (R47.1)    Follow Up Recommendations  Home health SLP;24 hour supervision/assistance    Frequency and Duration min 2x/week  2 weeks      SLP Evaluation Cognition  Overall Cognitive Status: Within Functional Limits for tasks assessed       Comprehension  Auditory Comprehension Overall Auditory Comprehension: Appears within functional limits for tasks assessed    Expression Expression Primary Mode of Expression: Verbal Verbal Expression Overall Verbal Expression: Impaired at baseline   Oral / Motor  Oral Motor/Sensory Function Overall Oral Motor/Sensory Function: Generalized oral weakness Motor Speech Overall Motor Speech: Impaired Phonation: Low vocal intensity Articulation: Impaired(per family)   GO                    Venita Sheffield Keion Neels 01/26/2019, 11:14 AM  [name], M.A.  [place] Acute Environmental education officer [phone] Office [phone]

## 2019-01-26 NOTE — Progress Notes (Signed)
Patient off the floor for Test.

## 2019-01-26 NOTE — Progress Notes (Signed)
SLP Cancellation Note  Patient Details Name: Jill Castaneda MRN: [id number] DOB: February 16, 1928   Cancelled treatment:       Reason Eval/Treat Not Completed: Patient at procedure or test/unavailable   Talbert Nan 01/26/2019, 10:11 AM  [name], M.A. [place] Acute Environmental education officer [phone] Office [phone]

## 2019-01-26 NOTE — Progress Notes (Signed)
Rehab Admissions Coordinator Note:  Patient was screened by Cleatrice Burke for appropriateness for an Inpatient Acute Rehab Consult per PT rec,.  At this time, we are recommending Inpatient Rehab consult.  Cleatrice Burke RN MSN 01/26/2019, 4:19 PM  I can be reached at [phone].

## 2019-01-26 NOTE — H&P (Addendum)
History and Physical    Jill Castaneda KNL:[id number] DOB: 1928/09/07 DOA: 01/25/2019  PCP: Patient, No Pcp Per   Patient coming from: Home   Chief Complaint: Generalized weakness, slurred speech   HPI: Jill Castaneda is a 85 y.o. female with medical history significant for coronary artery disease, multiple strokes with residual deficits, hypertension, and history of PE after a hip replacement, now presenting to emergency department for evaluation of generalized weakness and speech disturbance.  Patient is accompanied by her granddaughter who assists with the history.  She had reportedly been in her usual state of health and was having an uneventful day when she was noted to be slumped over in her chair, drooling, and with slurred speech.  This was at approximately 9:30 PM on 01/25/2019.  Patient had not been complaining of anything and specifically denies chest pain, palpitations, or headache.  She has chronic gaze deviation and some right upper extremity deficits from her prior CVAs and no new focal weakness or numbness has been noted.  There was no fall or trauma preceding this.  Patient has not been coughing, denies dysuria or abdominal pain, and has not been febrile.  ED Course: Upon arrival to the ED, patient is found to be afebrile, saturating well on room air, diastolic blood pressure 790, and vitals otherwise normal.  EKG features a sinus rhythm with RBBB and LAFB.  Chest x-ray is negative for acute cardiopulmonary disease.  Noncontrast head CT is negative for acute intracranial hemorrhage or mass-effect, but notable for advanced chronic cerebral ischemic disease that has progressed since 2015 with several age-indeterminate areas.  Chemistry panel is unremarkable and CBC notable for polycythemia and thrombocytosis.  Urinalysis features 5 ketones.  Neurology was consulted by the ED physician and MRI brain and hospitalist admission to [facility] was recommended.  Review of Systems:  All other  systems reviewed and apart from HPI, are negative.  Past Medical History:  Diagnosis Date   Cataract    Complication of anesthesia    Difficulty waking up "   Coronary artery disease    Hip fracture ([place]) 06/2017   right hip   Hypertension    Osteoporosis    Stroke [facility])     Past Surgical History:  Procedure Laterality Date   ANTERIOR APPROACH HEMI HIP ARTHROPLASTY Right 06/20/2017   Procedure: ANTERIOR APPROACH HEMI HIP ARTHROPLASTY;  Surgeon: Rod Can, MD;  Location: [place];  Service: Orthopedics;  Laterality: Right;   FRACTURE SURGERY     SHOULDER HARDWARE REMOVAL Left    SHOULDER HEMI-ARTHROPLASTY Left    TOTAL HIP ARTHROPLASTY Right      reports that she has never smoked. She has never used smokeless tobacco. She reports that she does not drink alcohol or use drugs.  No Known Allergies  Family History  Problem Relation Age of Onset   Hypertension Father    Heart disease Father    Hypertension Sister    Heart disease Sister    Stroke Mother    Cancer Brother      Prior to Admission medications   Medication Sig Start Date End Date Taking? Authorizing Provider  amLODipine (NORVASC) 2.5 MG tablet TAKE ONE TABLET BY MOUTH ONCE DAILY Patient taking differently: TAKE 2.5 mg TABLET BY MOUTH ONCE DAILY 02/22/17  Yes Jeffery, [place]le, PA  Ascorbic Acid (VITAMIN C) 1000 MG tablet Take 1,000 mg by mouth daily.   Yes [provider]  aspirin 81 MG chewable tablet Chew 1 tablet (81 mg total) by  mouth 2 (two) times daily with a meal. Patient taking differently: Chew 81 mg by mouth daily.  06/21/17  Yes Swinteck, Aaron Edelman, MD  calcium-vitamin D (OSCAL WITH D) 500-200 MG-UNIT tablet Take 1 tablet by mouth 3 (three) times a week.   Yes [provider]  cholecalciferol (VITAMIN D3) 25 MCG (1000 UT) tablet Take 1,000 Units by mouth daily.   Yes [provider]  ferrous sulfate 325 (65 FE) MG tablet Take 325 mg by mouth daily with  breakfast.   Yes [provider]  Multiple Vitamin (MULTIVITAMIN WITH MINERALS) TABS tablet Take 2 tablets by mouth daily.    Yes [provider]  omega-3 acid ethyl esters (LOVAZA) 1 g capsule Take 1 g by mouth daily.   Yes [provider]    Physical Exam: Vitals:   01/25/19 2330 01/25/19 2341 01/26/19 0130  BP: (!) 175/88  (!) 173/109  Pulse: 75  93  Resp: 15  16  Temp:  97.8 F (36.6 C)   TempSrc:  Oral   SpO2: 92%  91%    Constitutional: NAD, calm, appears frail  Eyes: PERTLA, lids and conjunctivae normal ENMT: Mucous membranes are moist. Posterior pharynx clear of any exudate or lesions.   Neck: normal, supple, no masses, no thyromegaly Respiratory: clear to auscultation bilaterally, no wheezing, no crackles. Normal respiratory effort.    Cardiovascular: S1 & S2 heard, regular rate and rhythm. No extremity edema.   Abdomen: No distension, no tenderness, soft. Bowel sounds active.  Musculoskeletal: no clubbing / cyanosis. No joint deformity upper and lower extremities.    Skin: no significant rashes, lesions, ulcers. Warm, dry, well-perfused. Neurologic: Dysconjugate gaze, PERRL. Subtle right lower facial weakness. Sensation intact, patellar DTRs intact. Dysmetria involving RUE. Strength 5/5 in all 4 limbs.  Psychiatric: Alert and oriented to person, place, and situation. Pleasant, cooperative.    Labs on Admission: I have personally reviewed following labs and imaging studies  CBC: Recent Labs  Lab 01/25/19 2245  WBC 8.5  NEUTROABS 6.4  HGB 16.7*  HCT 53.6*  MCV 87.9  PLT 9,702*   Basic Metabolic Panel: Recent Labs  Lab 01/25/19 2245 01/25/19 2254  NA 139  --   K 4.1  --   CL 102  --   CO2 26  --   GLUCOSE 106*  --   BUN 19  --   CREATININE 0.96 0.90  CALCIUM 9.8  --    GFR: CrCl cannot be calculated (Unknown ideal weight.). Liver Function Tests: Recent Labs  Lab 01/25/19 2245  AST 32  ALT 15  ALKPHOS 59  BILITOT 0.8    PROT 7.5  ALBUMIN 4.2   No results for input(s): LIPASE, AMYLASE in the last 168 hours. No results for input(s): AMMONIA in the last 168 hours. Coagulation Profile: Recent Labs  Lab 01/25/19 2245  INR 0.9   Cardiac Enzymes: No results for input(s): CKTOTAL, CKMB, CKMBINDEX, TROPONINI in the last 168 hours. BNP (last 3 results) No results for input(s): PROBNP in the last 8760 hours. HbA1C: No results for input(s): HGBA1C in the last 72 hours. CBG: No results for input(s): GLUCAP in the last 168 hours. Lipid Profile: No results for input(s): CHOL, HDL, LDLCALC, TRIG, CHOLHDL, LDLDIRECT in the last 72 hours. Thyroid Function Tests: No results for input(s): TSH, T4TOTAL, FREET4, T3FREE, THYROIDAB in the last 72 hours. Anemia Panel: No results for input(s): VITAMINB12, FOLATE, FERRITIN, TIBC, IRON, RETICCTPCT in the last 72 hours. Urine analysis:  Component Value Date/Time   COLORURINE STRAW (A) 01/26/2019 0101   APPEARANCEUR CLEAR 01/26/2019 0101   LABSPEC 1.006 01/26/2019 0101   PHURINE 8.0 01/26/2019 0101   GLUCOSEU NEGATIVE 01/26/2019 0101   HGBUR NEGATIVE 01/26/2019 0101   BILIRUBINUR NEGATIVE 01/26/2019 0101   KETONESUR 5 (A) 01/26/2019 0101   PROTEINUR NEGATIVE 01/26/2019 0101   NITRITE NEGATIVE 01/26/2019 0101   LEUKOCYTESUR NEGATIVE 01/26/2019 0101   Sepsis Labs: @LABRCNTIP (procalcitonin:4,lacticidven:4) )No results found for this or any previous visit (from the past 240 hour(s)).   Radiological Exams on Admission: Dg Chest 2 View  Result Date: 01/26/2019 CLINICAL DATA:  Altered mental status. EXAM: CHEST - 2 VIEW COMPARISON:  Chest radiograph 06/19/2017. CT 06/23/2017 FINDINGS: The patient is rotated. Heart size normal for technique. Aortic tortuosity. No pulmonary edema. No focal airspace disease. No pleural effusion or pneumothorax. Bones are under mineralized with scoliosis. IMPRESSION: Rotated exam without acute abnormality. Electronically Signed   By:  Keith Rake M.D.   On: 01/26/2019 00:14   Ct Head Code Stroke Wo Contrast  Result Date: 01/25/2019 CLINICAL DATA:  Code stroke. 85 year old female with slurred speech and right side weakness. EXAM: CT HEAD WITHOUT CONTRAST TECHNIQUE: Contiguous axial images were obtained from the base of the skull through the vertex without intravenous contrast. COMPARISON:  Head CT 08/18/2014. Brain MRI 03/09/2014. FINDINGS: Brain: Cerebral volume is not significantly changed. No ventriculomegaly. No acute intracranial hemorrhage identified. No midline shift, mass effect, or evidence of intracranial mass lesion. 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 Small areas of cortical and subcortical white matter hypodensity/encephalomalacia in the posterior left MCA territory on series 2, image 20 and 18 appear increased but not brand new. There is a similar chronic appearing much smaller right parietal cortical infarct best seen on sagittal image 8. No other cortically based infarct identified. Vascular: Tortuous. Calcified atherosclerosis at the skull base. No suspicious intracranial vascular hyperdensity. Skull: No acute osseous abnormality identified. Sinuses/Orbits: Visualized paranasal sinuses and mastoids are stable and well pneumatized. Other: Small nonspecific left forehead scalp soft tissue polypoid lesion on series 3, image 52 is new since 2015 but probably benign. Interval postoperative changes to both globes. ASPECTS [facility] Stroke Program Early CT Score) - Ganglionic level infarction (caudate, lentiform nuclei, internal capsule, insula, M1-M3 cortex): - Supraganglionic infarction (M4-M6 cortex): Total score (0-10 with 10 being normal): IMPRESSION: 1. Advanced chronic ischemic disease with progression in both cerebral and cerebellar hemispheres since 2015. Several  bilateral areas are age indeterminate. 2. No acute intracranial hemorrhage or mass effect. 3.  No suspicious intracranial vascular hyperdensity. Study discussed by telephone with Dr. Virgel Manifold on 01/25/2019 at 22:40 . Electronically Signed   By: Genevie Ann M.D.   On: 01/25/2019 22:41    EKG: Independently reviewed. Sinus rhythm, RBBB, LAFB.    Assessment/Plan   1. Generalized weakness, dysarthria  - Presents with generalized weakness and dysarthria with dysarthria improving in ED but gen weakness unchanged  - Head CT with advanced chronic ischemic disease that has progressed since 2015 with some age-indeterminate areas  - Neurology is consulting and much appreciated, recommending MRI brain and medical admission   - There is a gaze deviation and RUE dysmetria that is chronic per family  - Acute ischemic CVA is on DDx, but generalized weakness/lethargy is the primary finding on admission and DDx is broad  - Check MRI  brain, serum CK, TSH, B12, and folate  - Continue neuro checks and cardiac monitoring  - Consult with PT/OT/SLP  - Continue ASA   2. Hypertension  - DBP in low 100's in ED  - Permit HTN to 194/174 while evaluating for possible acute CVA    3. CAD  - No anginal complaints  - Continue ASA   4. Thrombocytosis  - Platelets 1024k on admission, up from priors in 500-800k range  - Repeat CBC in am with smear review, check iron-studies, ESR, and CRP    DVT prophylaxis: Lovenox  Code Status: DNR; confirmed with patient   Family Communication: Granddaughter updated at bedside Consults called: Neurology  Admission status: Observation     Vianne Bulls, MD [facility] Pager [phone]  If 7PM-7AM, please contact night-coverage [email or web address] Password [email or web address]  01/26/2019, 2:15 AM

## 2019-01-26 NOTE — ED Notes (Signed)
Patient verbalized permission for RN to sign for discharge.

## 2019-01-26 NOTE — Progress Notes (Signed)
OT Cancellation Note  Patient Details Name: Jill Castaneda MRN: [id number] DOB: [birth year]   Cancelled Treatment:    Reason Eval/Treat Not Completed: Patient at procedure or test/ unavailable. Pt returning to room from procedure and now with SLP. Plan to reattempt.   Tyrone Schimke, OT Acute Rehabilitation Services Pager: [phone] Office: [phone]  01/26/2019, 10:14 AM

## 2019-01-26 NOTE — Evaluation (Signed)
Clinical/Bedside Swallow Evaluation Patient Details  Name: Jill Castaneda MRN: [id number] Date of Birth: 26-Dec-1927  Today's Date: 01/26/2019 Time: SLP Start Time (ACUTE ONLY): 1014 SLP Stop Time (ACUTE ONLY): 1030 SLP Time Calculation (min) (ACUTE ONLY): 16 min  Past Medical History:  Past Medical History:  Diagnosis Date  . Cataract   . Complication of anesthesia    Difficulty waking up "  . Coronary artery disease   . Hip fracture ([place]) 06/2017   right hip  . Hypertension   . Osteoporosis   . Stroke [facility])    Past Surgical History:  Past Surgical History:  Procedure Laterality Date  . ANTERIOR APPROACH HEMI HIP ARTHROPLASTY Right 06/20/2017   Procedure: ANTERIOR APPROACH HEMI HIP ARTHROPLASTY;  Surgeon: Rod Can, MD;  Location: [place];  Service: Orthopedics;  Laterality: Right;  . FRACTURE SURGERY    . SHOULDER HARDWARE REMOVAL Left   . SHOULDER HEMI-ARTHROPLASTY Left   . TOTAL HIP ARTHROPLASTY Right    HPI:  Pt is an 85 year old female who presents with slurred speech and difficulty swallowing. MRI showed  a new posterior limb of the internal capsule stroke on the right as well as a small area of cortical stroke in the right frontal cortex. PMH includes: CAD, HTN, CVA (with expressive aphasia that improved almost back to baseline per granddaughter, no other SLP needs at that time)   Assessment / Plan / Recommendation Clinical Impression  Pt has generalized weakness and reduced labial seal, but clears her mouth well given boluses of puree. Mild anterior spillage is observed with thin liquids, less so when consumed via straw. Her granddaughter, who translates for her, says that she has been touching her face frequently "as if she can't feel it." Multiple swallows and immediate coughing is observed on larger boluses of thin liquids and both her reflexive and volitional coughing appear weak. Per pt's granddaughter, she and the pt would like to have MBS done to see what could be  a safe consistency and to facilitate treatment planning. MBS scheduled with radiology for later today. SLP Visit Diagnosis: Dysphagia, unspecified (R13.10)    Aspiration Risk  Moderate aspiration risk    Diet Recommendation NPO   Medication Administration: Via alternative means    Other  Recommendations Oral Care Recommendations: Oral care QID   Follow up Recommendations (tba)      Frequency and Duration            Prognosis Prognosis for Safe Diet Advancement: Good      Swallow Study   General HPI: Pt is an 85 year old female who presents with slurred speech and difficulty swallowing. MRI showed  a new posterior limb of the internal capsule stroke on the right as well as a small area of cortical stroke in the right frontal cortex. PMH includes: CAD, HTN, CVA (with expressive aphasia that improved almost back to baseline per granddaughter, no other SLP needs at that time) Type of Study: Bedside Swallow Evaluation Previous Swallow Assessment: none in chart Diet Prior to this Study: NPO Temperature Spikes Noted: No Respiratory Status: Room air History of Recent Intubation: No Behavior/Cognition: Alert;Cooperative;Pleasant mood Oral Cavity Assessment: Dry Oral Care Completed by SLP: No Oral Cavity - Dentition: Edentulous;Dentures, not available(per family, removed because she was "choking" on them) Vision: Functional for self-feeding Self-Feeding Abilities: Needs assist Patient Positioning: Upright in bed Baseline Vocal Quality: Low vocal intensity Volitional Cough: Weak Volitional Swallow: Able to elicit    Oral/Motor/Sensory Function Overall Oral Motor/Sensory Function: Generalized oral weakness  Ice Chips Ice chips: Impaired Presentation: Spoon Oral Phase Impairments: Reduced labial seal   Thin Liquid Thin Liquid: Impaired Presentation: Self Fed;Spoon;Straw Oral Phase Impairments: Reduced labial seal Pharyngeal  Phase Impairments: Multiple swallows;Cough - Immediate     Nectar Thick Nectar Thick Liquid: Not tested   Honey Thick Honey Thick Liquid: Not tested   Puree Puree: Within functional limits Presentation: Spoon   Solid     Solid: Not tested      [name] 01/26/2019,11:05 AM  [name], M.A. [place] Acute Environmental education officer [phone] Office [phone]

## 2019-01-26 NOTE — Progress Notes (Signed)
Patient arrived back to the unit from CTA and Barium swallow.

## 2019-01-26 NOTE — Consult Note (Signed)
Neurology Consultation  Reason for Consult: Slurred speech Referring Physician: Dr. Eulogio Bear  CC: Slurred speech History is obtained from: Patient's granddaughter who acted as the interpreter as patient is Russian-speaking only, chart  HPI: Jill Castaneda is a 85 y.o. female past medical history significant for coronary artery disease, hypertension, left midbrain and left thalamic stroke in 2015 with some residual diplopia and right-sided tremor, last known normal 9 AM on 01/25/2019 when she was found slumped over the couch by family.  After waking up, it was noted that her speech is extremely slurred and she was drooling from her mouth.  Family was concerned for strokelike symptoms and brought her to the emergency room for evaluation.  She was evaluated in the emergency room and admitted for the new onset slurred speech, possible left-sided weakness as well. The patient's granddaughter confirmed that at baseline patient speech is much clearer than what it is right now.  She is also having difficulty swallowing.  There is no preceding illness, fevers, chills.  There is no chest pain nausea vomiting shortness of breath. The patient is able to do her activities of daily living   LKW: ?0900 on 01/25/2019 tpa given?: no, evaluated by telemedicine neurology and decision made that. Premorbid modified Rankin scale (mRS): 2-lives with granddaughter who takes care of her and walks without a cane or walker, needs help with a shower only for fall prevention but is able to bathe herself feed herself normally.  ROS: ROS was performed and is negative except as noted in the HPI.   Past Medical History:  Diagnosis Date  . Cataract   . Complication of anesthesia    Difficulty waking up "  . Coronary artery disease   . Hip fracture ([place]) 06/2017   right hip  . Hypertension   . Osteoporosis   . Stroke [facility])     Family History  Problem Relation Age of Onset  . Hypertension Father   . Heart disease  Father   . Hypertension Sister   . Heart disease Sister   . Stroke Mother   . Cancer Brother    Social History:   reports that she has never smoked. She has never used smokeless tobacco. She reports that she does not drink alcohol or use drugs.  Medications  Current Facility-Administered Medications:  .   stroke: mapping our early stages of recovery book, , Does not apply, Once, Svalina, Gorica, MD .  0.9 %  sodium chloride infusion, , Intravenous, Continuous, Opyd, Ilene Qua, MD, Last Rate: 90 mL/hr at 01/26/19 0521 .  acetaminophen (TYLENOL) tablet 650 mg, 650 mg, Oral, Q4H PRN **OR** acetaminophen (TYLENOL) solution 650 mg, 650 mg, Per Tube, Q4H PRN **OR** acetaminophen (TYLENOL) suppository 650 mg, 650 mg, Rectal, Q4H PRN, Opyd, Timothy S, MD .  aspirin suppository 300 mg, 300 mg, Rectal, Daily **OR** aspirin tablet 325 mg, 325 mg, Oral, Daily, Opyd, Timothy S, MD .  enoxaparin (LOVENOX) injection 30 mg, 30 mg, Subcutaneous, Q24H, Opyd, Timothy S, MD .  labetalol (NORMODYNE,TRANDATE) injection 5 mg, 5 mg, Intravenous, Q2H PRN, Opyd, Timothy S, MD .  senna-docusate (Senokot-S) tablet 1 tablet, 1 tablet, Oral, QHS PRN, Opyd, Ilene Qua, MD  Exam: Current vital signs: BP (!) 148/90 (BP Location: Left Arm)   Pulse 78   Temp 98 F (36.7 C) (Oral)   Resp 20   Ht 4\' 8"  (1.422 m)   Wt 45.1 kg   SpO2 94%   BMI 22.29 kg/m  Vital signs  in last 24 hours: Temp:  [97.7 F (36.5 C)-98 F (36.7 C)] 98 F (36.7 C) (03/27 0700) Pulse Rate:  [75-102] 78 (03/27 0700) Resp:  [15-22] 20 (03/27 0700) BP: (136-186)/(86-109) 148/90 (03/27 0700) SpO2:  [90 %-94 %] 94 % (03/27 0700) Weight:  [45.1 kg] 45.1 kg (03/27 0500) Granddaughter at bedside who acted as Biochemist, clinical.  Patient does not speak [place]. General: Awake alert in no distress HEENT: Normocephalic atraumatic, edentulous CVS: S1 is heard regular rate rhythm [place] soft nondistended nontender Extremities: Warm well perfused  with no edema Neurological exam Patient awake alert oriented x3 Her speech is severely dysarthric and very difficult to comprehend. She has no aphasia-naming comprehension repetition intact. Cranial nerves: Pupils are pinpoint and sluggishly reactive bilaterally, no restriction of movement of eyes in either direction, does not complain of diplopia, visual fields are full to threat on both sides, face appears symmetric, auditory acuity is moderately reduced bilaterally, tongue midline. Motor exam: She has a symmetric 5/5 strength in both upper extremities with no vertical drift.  She has mild weakness in the left leg 4+/5 and normal 5/5 on the right leg with no drift in either legs for 5 seconds. Sensory exam: Intact light touch all over Cerebellar: She is ataxic on finger-nose-finger on both right and left  Gait testing is deferred at this time  NIHSS- 3   Labs I have reviewed labs in epic and the results pertinent to this consultation are:  CBC    Component Value Date/Time   WBC 8.5 01/25/2019 2245   RBC 6.10 (H) 01/25/2019 2245   HGB 16.7 (H) 01/25/2019 2245   HCT 53.6 (H) 01/25/2019 2245   PLT 1,024 (HH) 01/25/2019 2245   MCV 87.9 01/25/2019 2245   MCH 27.4 01/25/2019 2245   MCHC 31.2 01/25/2019 2245   RDW 16.5 (H) 01/25/2019 2245   LYMPHSABS 1.2 01/25/2019 2245   MONOABS 0.6 01/25/2019 2245   EOSABS 0.2 01/25/2019 2245   BASOSABS 0.1 01/25/2019 2245    CMP     Component Value Date/Time   NA 139 01/25/2019 2245   K 4.1 01/25/2019 2245   CL 102 01/25/2019 2245   CO2 26 01/25/2019 2245   GLUCOSE 106 (H) 01/25/2019 2245   BUN 19 01/25/2019 2245   CREATININE 0.90 01/25/2019 2254   CALCIUM 9.8 01/25/2019 2245   PROT 7.5 01/25/2019 2245   ALBUMIN 4.2 01/25/2019 2245   AST 32 01/25/2019 2245   ALT 15 01/25/2019 2245   ALKPHOS 59 01/25/2019 2245   BILITOT 0.8 01/25/2019 2245   GFRNONAA 52 (L) 01/25/2019 2245   GFRAA >60 01/25/2019 2245   Imaging I have reviewed the  images obtained: CT of the head done as an acute code stroke at the outside hospital shows no acute changes. MRI examination of the brain completed this morning shows acute infarct in the posterior limb of the internal capsule on the right extending into the carotid body.  Small area of acute infarct in the right parietal cortex as well.  Advanced chronic microvascular ischemia.  Assessment: 85 year old man past medical history of coronary artery disease hypertension left midbrain and left thalamic stroke in 2015 with some residual diplopia and right-sided tremor last known normal 9 AM on 01/05/2019 found to be dysarthric and mildly weak on the left side.  Outside the window for IV TPA and no cortical signs for endovascular intervention. Found to have a new posterior limb of the internal capsule stroke on the right as  well as a small area of cortical stroke in the right frontal cortex. Although this is most likely small vessel disease, a cardiac source for an embolic phenomenon should be evaluated as well. Impression: Acute ischemic stroke-small vessel versus cardio [place]  Recommendations: Telemetry Aspirin 325 Atorvastatin 80 CTA head and neck 2D echocardiogram Fasting lipid panel Hemoglobin A1c Frequent neurochecks PT OT Speech eval  Might need outpatient cardiac monitoring if inpatient monitoring is unremarkable for atrial fibrillation.  Stroke team will follow with you tomorrow.  -- Amie Portland, MD [facility] Pager: [phone] If 7pm to 7am, please call on call as listed on AMION.

## 2019-01-26 NOTE — Progress Notes (Addendum)
Patient admitted after midnight. See H&P for details. Presented with left sided weakness and slurred speech. Workup reveals acute infarct in the posterior limb internal capsule on the right extending into the caudate body. Small area of acute infarct right parietal cortex. Admitted for stoke work up.  PE: Gen: awake alert no acute distress CV: rrr no mgr no LE edema Resp: no increased work of breathing. BS distant but clear Abd: flat soft +Bs Neuro: speech slurred, difficulty swallowing, left grip 3/5 right grip 4/5, LLE strength 3/5 right LE strength 4/5.   Santiago Glad m. Black, NP  Patient admitted after-midnight.  She speaks russian and will not interact with an IPAD well so it is important that her granddaughter to be at bedside so appropriate neuro checks/assessments can be done.  Work up in-process- CTA, echo, MBS [employer]

## 2019-01-26 NOTE — Progress Notes (Deleted)
NEUROLOGY CONSULTATION  Chief complaint: Slurred speech Requesting physician: Eulogio Bear, DO  HPI: Patient evaluated at bedside with granddaughter providing interpretation. Granddaughter recounts that patient was in her normal state of health until yesterday morning around 9am when she found her slumped over on the couch. After waking her up she noted the patient was having slurred speech and was drooling. She recounts prior strokes with residual diplopia, right arm tremor/dysmetria, and left eye movement limitation; she has been on aspirin alone for the last 5 years due to issues with polypharmacy in the past. She has not had recent illness and has had good oral intake. Since admission, granddaughter states she still has some dysarthria and new left sided weakness.  Patient currently denies pain or discomfort. She wonders when she can go home.  Objective: Current vital signs: BP (!) 148/90 (BP Location: Left Arm)   Pulse 78   Temp 98 F (36.7 C) (Oral)   Resp 20   Ht 4\' 8"  (1.422 m)   Wt 45.1 kg   SpO2 94%   BMI 22.29 kg/m  Vital signs in last 24 hours: Temp:  [97.7 F (36.5 C)-98 F (36.7 C)] 98 F (36.7 C) (03/27 0700) Pulse Rate:  [75-102] 78 (03/27 0700) Resp:  [15-22] 20 (03/27 0700) BP: (136-186)/(86-109) 148/90 (03/27 0700) SpO2:  [90 %-94 %] 94 % (03/27 0700) Weight:  [45.1 kg] 45.1 kg (03/27 0500)  Neurologic Exam: Neurological Examination Mental Status: Alert, oriented to self, state, month and year. Family states still having significant dysarthria Cranial Nerves: II: Visual fields grossly normal III,IV, VI: ptosis not present on right, extra-ocular motions intact bilaterally pupils equal, round, reactive to light  V,VII: smile symmetric, facial light touch sensation normal bilaterally VIII: hearing normal bilaterally IX,X: soft palate rises symmetrically XI: bilateral shoulder shrug with lag on the left XII: midline tongue extension Motor: Right : Upper  extremity   4/5   Lower extremity   4/5    Left:     Upper extremity   4-/5  Lower extremity   4-/5     Tone and bulk:normal tone throughout; no atrophy noted Sensory: Subjectively intact light touch bilaterally but extinction on the left Cerebellar: Significant dysmetria on RUE, limited attempt at finger to nose on LUE  Lab Results: Results for orders placed or performed during the hospital encounter of 01/25/19 (from the past 48 hour(s))  Ethanol     Status: None   Collection Time: 01/25/19 10:45 PM  Result Value Ref Range   Alcohol, Ethyl (B) <10 <10 mg/dL    Comment: (NOTE) Lowest detectable limit for serum alcohol is 10 mg/dL. For medical purposes only. Performed at [facility], [place] [address], [place], [place] [address]   Protime-INR     Status: None   Collection Time: 01/25/19 10:45 PM  Result Value Ref Range   Prothrombin Time 12.0 11.4 - 15.2 seconds   INR 0.9 0.8 - 1.2    Comment: (NOTE) INR goal varies based on device and disease states. Performed at [facility], [place] [address], [place], [place] [address]   APTT     Status: Abnormal   Collection Time: 01/25/19 10:45 PM  Result Value Ref Range   aPTT 42 (H) 24 - 36 seconds    Comment:        IF BASELINE aPTT IS ELEVATED, SUGGEST PATIENT RISK ASSESSMENT BE USED TO DETERMINE APPROPRIATE ANTICOAGULANT THERAPY. Performed at [facility], [place] [address]  [place], [place] [address]   CBC     Status: Abnormal   Collection Time: 01/25/19 10:45 PM  Result Value Ref Range   WBC 8.5 4.0 - 10.5 K/uL   RBC 6.10 (H) 3.87 - 5.11 MIL/uL   Hemoglobin 16.7 (H) 12.0 - 15.0 g/dL   HCT 53.6 (H) 36.0 - 46.0 %   MCV 87.9 80.0 - 100.0 fL   MCH 27.4 26.0 - 34.0 pg   MCHC 31.2 30.0 - 36.0 g/dL   RDW 16.5 (H) 11.5 - 15.5 %   Platelets 1,024 (HH) 150 - 400 K/uL    Comment: REPEATED TO VERIFY THIS CRITICAL RESULT HAS VERIFIED AND BEEN CALLED TO RN JENNIFER BY ALEXIS  CRUICKSHANK ON 03 26 2020 AT 2348, AND HAS BEEN READ BACK.     nRBC 0.0 0.0 - 0.2 %    Comment: Performed at [facility], [place] [address] [place], [place] [address]  Differential     Status: None   Collection Time: 01/25/19 10:45 PM  Result Value Ref Range   Neutrophils Relative % 76 %   Neutro Abs 6.4 1.7 - 7.7 K/uL   Lymphocytes Relative 14 %   Lymphs Abs 1.2 0.7 - 4.0 K/uL   Monocytes Relative 7 %   Monocytes Absolute 0.6 0.1 - 1.0 K/uL   Eosinophils Relative 2 %   Eosinophils Absolute 0.2 0.0 - 0.5 K/uL   Basophils Relative 1 %   Basophils Absolute 0.1 0.0 - 0.1 K/uL   Immature Granulocytes 0 %   Abs Immature Granulocytes 0.03 0.00 - 0.07 K/uL    Comment: Performed at [facility], [place] [address] [place], [place] [address]  Comprehensive metabolic panel     Status: Abnormal   Collection Time: 01/25/19 10:45 PM  Result Value Ref Range   Sodium 139 135 - 145 mmol/L   Potassium 4.1 3.5 - 5.1 mmol/L   Chloride 102 98 - 111 mmol/L   CO2 26 22 - 32 mmol/L   Glucose, Bld 106 (H) 70 - 99 mg/dL   BUN 19 8 - 23 mg/dL   Creatinine, Ser 0.96 0.44 - 1.00 mg/dL   Calcium 9.8 8.9 - 10.3 mg/dL   Total Protein 7.5 6.5 - 8.1 g/dL   Albumin 4.2 3.5 - 5.0 g/dL   AST 32 15 - 41 U/L   ALT 15 0 - 44 U/L   Alkaline Phosphatase 59 38 - 126 U/L   Total Bilirubin 0.8 0.3 - 1.2 mg/dL   GFR calc non Af Amer 52 (L) >60 mL/min   GFR calc Af Amer >60 >60 mL/min   Anion gap 11 5 - 15    Comment: Performed at [facility], [place] [address], [place], [place] [address]  I-stat Creatinine, ED     Status: None   Collection Time: 01/25/19 10:54 PM  Result Value Ref Range   Creatinine, Ser 0.90 0.44 - 1.00 mg/dL  Hemoglobin A1c     Status: Abnormal   Collection Time: 01/25/19 10:55 PM  Result Value Ref Range   Hgb A1c MFr Bld 5.8 (H) 4.8 - 5.6 %    Comment: (NOTE) Pre diabetes:          5.7%-6.4% Diabetes:              >6.4% Glycemic control  for   <7.0% adults with diabetes    Mean Plasma Glucose 119.76 mg/dL    Comment: Performed at [facility], [address] [place], [place] [address]  Lipid panel     Status: Abnormal   Collection Time: 01/25/19 10:55 PM  Result Value Ref Range   Cholesterol 234 (H) 0 - 200 mg/dL   Triglycerides 88 <150 mg/dL   HDL 62 >40 mg/dL   Total CHOL/HDL Ratio 3.8 RATIO   VLDL 18 0 - 40 mg/dL   LDL Cholesterol 154 (H) 0 - 99 mg/dL    Comment:        Total Cholesterol/HDL:CHD Risk Coronary Heart Disease Risk Table                     Men   Women  1/2 Average Risk   3.4   3.3  Average Risk       5.0   4.4  2 X Average Risk   9.6   7.1  3 X Average Risk  23.4   11.0        Use the calculated Patient Ratio above and the CHD Risk Table to determine the patient's CHD Risk.        ATP III CLASSIFICATION (LDL):  <100     mg/dL   Optimal  100-129  mg/dL   Near or Above                    Optimal  130-159  mg/dL   Borderline  160-189  mg/dL   High  >190     mg/dL   Very High Performed at [place] [address], [place], [place] [address]   Urinalysis, Routine w reflex microscopic     Status: Abnormal   Collection Time: 01/26/19  1:01 AM  Result Value Ref Range   Color, Urine STRAW (A) YELLOW   APPearance CLEAR CLEAR   Specific Gravity, Urine 1.006 1.005 - 1.030   pH 8.0 5.0 - 8.0   Glucose, UA NEGATIVE NEGATIVE mg/dL   Hgb urine dipstick NEGATIVE NEGATIVE   Bilirubin Urine NEGATIVE NEGATIVE   Ketones, ur 5 (A) NEGATIVE mg/dL   Protein, ur NEGATIVE NEGATIVE mg/dL   Nitrite NEGATIVE NEGATIVE   Leukocytes,Ua NEGATIVE NEGATIVE    Comment: Performed at [place] [address], [place], [place] [address]  Vitamin B12     Status: None   Collection Time: 01/26/19  7:12 AM  Result Value Ref Range   Vitamin B-12 359 180 - 914 pg/mL    Comment: (NOTE) This assay is not validated for testing neonatal or myeloproliferative syndrome  specimens for Vitamin B12 levels. Performed at [facility], [place] [address], [place], [place] [address]   CK     Status: None   Collection Time: 01/26/19  7:12 AM  Result Value Ref Range   Total CK 42 38 - 234 U/L    Comment: Performed at [facility], [place] [address] [place], [place] [address]  TSH     Status: Abnormal   Collection Time: 01/26/19  7:12 AM  Result Value Ref Range   TSH 7.553 (H) 0.350 - 4.500 uIU/mL    Comment: Performed by a 3rd Generation assay with a functional sensitivity of <=0.01 uIU/mL. Performed at [facility], [place] [address] [place], [place] [address]     No results found for this or any previous visit (from the past 240 hour(s)).  Lipid Panel Recent Labs    01/25/19 2255  CHOL 234*  TRIG 88  HDL 62  CHOLHDL 3.8  VLDL 18  LDLCALC 154*    Studies/Results: Dg Chest 2 View  Result Date: 01/26/2019 CLINICAL DATA:  Altered mental status. EXAM: CHEST - 2 VIEW COMPARISON:  Chest radiograph 06/19/2017. CT 06/23/2017 FINDINGS: The patient is rotated. Heart size normal for technique. Aortic tortuosity. No pulmonary edema. No focal airspace disease. No pleural effusion or pneumothorax. Bones are under mineralized with scoliosis. IMPRESSION: Rotated exam without acute abnormality. Electronically Signed   By: Keith Rake M.D.   On: 01/26/2019 00:14   Mr Brain Wo Contrast  Result Date: 01/26/2019 CLINICAL DATA:  Dysarthria.  History of stroke. EXAM: MRI HEAD WITHOUT CONTRAST TECHNIQUE: Multiplanar, multiecho pulse sequences of the brain and surrounding structures were obtained without intravenous contrast. COMPARISON:  CT head 01/25/2019.  MRI head 03/09/2014 FINDINGS: Brain: Acute infarct in the right basal ganglia involving the posterior limb internal capsule and caudate body. Small area of acute infarct in the right parietal cortex. Mild atrophy. Extensive chronic ischemic changes throughout the white matter. Chronic infarcts in the  thalamus bilaterally. Small chronic infarcts in the cerebellum bilaterally. Chronic infarct in the left midbrain. Negative for hemorrhage mass or midline shift. Vascular: Normal arterial flow voids Skull and upper cervical spine: Negative Sinuses/Orbits: Mild mucosal edema paranasal sinuses. Bilateral cataract surgery Other: None IMPRESSION: Acute infarct in the posterior limb internal capsule on the right extending into the caudate body. Small area of acute infarct right parietal cortex Advanced chronic microvascular ischemia. Electronically Signed   By: Franchot Gallo M.D.   On: 01/26/2019 07:10   Ct Head Code Stroke Wo Contrast  Result Date: 01/25/2019 CLINICAL DATA:  Code stroke. 85 year old female with slurred speech and right side weakness. EXAM: CT HEAD WITHOUT CONTRAST TECHNIQUE: Contiguous axial images were obtained from the base of the skull through the vertex without intravenous contrast. COMPARISON:  Head CT 08/18/2014. Brain MRI 03/09/2014. FINDINGS: Brain: Cerebral volume is not significantly changed. No ventriculomegaly. No acute intracranial hemorrhage identified. No midline shift, mass effect, or evidence of intracranial mass lesion. 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 Small areas of cortical and subcortical white matter hypodensity/encephalomalacia in the posterior left MCA territory on series 2, image 20 and 18 appear increased but not brand new. There is a similar chronic appearing much smaller right parietal cortical infarct best seen on sagittal image 8. No other cortically based infarct identified. Vascular: Tortuous. Calcified atherosclerosis at the skull base. No suspicious intracranial vascular hyperdensity. Skull: No acute osseous abnormality identified. Sinuses/Orbits: Visualized paranasal sinuses and mastoids are stable and well  pneumatized. Other: Small nonspecific left forehead scalp soft tissue polypoid lesion on series 3, image 52 is new since 2015 but probably benign. Interval postoperative changes to both globes. ASPECTS [facility] Stroke Program Early CT Score) - Ganglionic level infarction (caudate, lentiform nuclei, internal capsule, insula, M1-M3 cortex): - Supraganglionic infarction (M4-M6 cortex): Total score (0-10 with 10 being normal): IMPRESSION: 1. Advanced chronic ischemic disease with progression in both cerebral and cerebellar hemispheres since 2015. Several bilateral areas are age indeterminate. 2. No acute intracranial hemorrhage or mass effect. 3.  No suspicious intracranial vascular hyperdensity. Study discussed by telephone with Dr. Virgel Manifold on 01/25/2019 at 22:40 . Electronically Signed   By: Genevie Ann M.D.   On: 01/25/2019 22:41    Medications:  Scheduled: . aspirin  300 mg Rectal Daily   Or  .  aspirin  325 mg Oral Daily  . enoxaparin (LOVENOX) injection  30 mg Subcutaneous Q24H    Assessment/Plan: Jill Castaneda is an 85yo female with PMH of thrombocytosis, CVAs with residual right sided deficits, HTN, HLD presenting with dysarthria and left sided weakness found to have acute, right internal capsule and parietal cortex strokes. Strokes likely 2/2 arthrosclerotic disease based on history and imaging, however can't discount affect of thrombocytosis >7N though uncertain of chronicity at this level.  1. HgbA1c, fasting lipid panel - done; start high intensity statin 2. MRI - done; MRA  of the brain without contrast 3. PT consult, OT consult, Speech consult 4. Echocardiogram 5. Carotid dopplers 6. Prophylactic therapy-Antiplatelet med: Aspirin - dose 81mg  daily, plavix 75mg  daily for now 7. Risk factor modification 8. Telemetry monitoring 9. Frequent neuro checks   LOS: 0 days   Alphonzo Grieve, MD PGY3 01/26/2019  8:59 AM

## 2019-01-26 NOTE — Progress Notes (Signed)
Modified Barium Swallow Progress Note  Patient Details  Name: Jill Castaneda MRN: [id number] Date of Birth: 06/18/1928  Today's Date: 01/26/2019  Modified Barium Swallow completed.  Full report located under Chart Review in the Imaging Section.  Brief recommendations include the following:  Clinical Impression  Pt has a moderate oral and mild pharyngeal dysphagia with generalized fatigue that also likely contributes to aspiration risk. Her granddaughter was present for education as well as translation. She will only take very small bites/sips, resisting SLP if attempting to provide larger boluses, and she could not suck thickened liquid up via straw. She has weak lingual manipulation that results in poor bolus cohesion, incomplete posterior transit that leaves oral residue, and premature spillage that reaches the pyriform sinuses before the swallow. Premature spillage with thin liquids spills directly into the airway before the swallow. Pt has a cough reaction (1-2 seconds delayed) but this does not move aspirated material. Nectar thick liquids and purees are better contained above the valleculae and do not enter the airway. Recommend starting Dys 1 diet and nectar thick liquids by cup or spoon. Suspect that pt will need smaller, more frequent offerings of food/liquid so that she does not fatigue across a meal, as she appeared to fatigue even during this study. Fatigue could further increase her risk of aspiration. Diet and precautions were reviewed with handouts provided. Will continue to follow acutely.   Swallow Evaluation Recommendations       SLP Diet Recommendations: Dysphagia 1 (Puree) solids;Nectar thick liquid   Liquid Administration via: Cup;Spoon;No straw   Medication Administration: Crushed with puree   Supervision: Staff to assist with self feeding;Full supervision/cueing for compensatory strategies   Compensations: Slow rate;Small sips/bites   Postural Changes: Seated upright  at 90 degrees   Oral Care Recommendations: Oral care BID   Other Recommendations: Order thickener from pharmacy;Prohibited food (jello, ice cream, thin soups);Remove water pitcher    Jill Castaneda 01/26/2019,2:06 PM   [name], M.A. [facility] education officer [phone] Office [phone]

## 2019-01-26 NOTE — ED Notes (Signed)
Patient transported to x-ray. ?

## 2019-01-26 NOTE — ED Notes (Signed)
[facility] contacted for transport. Paperwork printed and at nursing station.  

## 2019-01-26 NOTE — Progress Notes (Signed)
  Echocardiogram 2D Echocardiogram limited has been performed with Definity due to contact precautions.  Jill Castaneda 01/26/2019, 2:02 PM

## 2019-01-27 ENCOUNTER — Inpatient Hospital Stay (HOSPITAL_COMMUNITY): Payer: Medicaid Other

## 2019-01-27 DIAGNOSIS — I6930 Unspecified sequelae of cerebral infarction: Secondary | ICD-10-CM

## 2019-01-27 DIAGNOSIS — I639 Cerebral infarction, unspecified: Secondary | ICD-10-CM

## 2019-01-27 NOTE — Progress Notes (Addendum)
STROKE TEAM PROGRESS NOTE   SUBJECTIVE (INTERVAL HISTORY) Her granddaughter is at the bedside serves as interpreter.  Patient lying in bed, pleasant, mildly lethargic, but fully orientated.  Has mild left-sided weakness, PT OT recommend CR.   OBJECTIVE Vitals:   01/26/19 2305 01/27/19 0412 01/27/19 0800 01/27/19 1242  BP: (!) 142/80 140/79 121/72 (!) 146/77  Pulse: 63 (!) 58 66 66  Resp: 14 16 16 18   Temp:  98.4 F (36.9 C) 98.8 F (37.1 C) 98.9 F (37.2 C)  TempSrc:  Oral Oral Oral  SpO2: 91% 95% 93% 90%  Weight:      Height:        CBC:  Recent Labs  Lab 01/25/19 2245  WBC 8.5  NEUTROABS 6.4  HGB 16.7*  HCT 53.6*  MCV 87.9  PLT 1,024*    Basic Metabolic Panel:  Recent Labs  Lab 01/25/19 2245 01/25/19 2254  NA 139  --   K 4.1  --   CL 102  --   CO2 26  --   GLUCOSE 106*  --   BUN 19  --   CREATININE 0.96 0.90  CALCIUM 9.8  --     Lipid Panel:     Component Value Date/Time   CHOL 234 (H) 01/25/2019 2255   TRIG 88 01/25/2019 2255   HDL 62 01/25/2019 2255   CHOLHDL 3.8 01/25/2019 2255   VLDL 18 01/25/2019 2255   LDLCALC 154 (H) 01/25/2019 2255   HgbA1c:  Lab Results  Component Value Date   HGBA1C 5.8 (H) 01/25/2019   Urine Drug Screen: No results found for: LABOPIA, COCAINSCRNUR, LABBENZ, AMPHETMU, THCU, LABBARB  Alcohol Level     Component Value Date/Time   ETH <10 01/25/2019 2245    IMAGING  Ct Angio Head W Or Wo Contrast Ct Angio Neck W Or Wo Contrast 01/26/2019 IMPRESSION:  1. Negative for large vessel occlusion. Acute infarcts on the right are consistent with small vessel occlusion. Extensive chronic microvascular ischemic change in the brain  2. No significant carotid or vertebral atherosclerotic disease in the neck.    Mr Brain Wo Contrast 01/26/2019 IMPRESSION:  Acute infarct in the posterior limb internal capsule on the right extending into the caudate body. Small area of acute infarct right parietal cortex.  Advanced chronic  microvascular ischemia.    Ct Head Code Stroke Wo Contrast 01/25/2019 IMPRESSION:  1. Advanced chronic ischemic disease with progression in both cerebral and cerebellar hemispheres since 2015. Several bilateral areas are age indeterminate.  2. No acute intracranial hemorrhage or mass effect.  3.  No suspicious intracranial vascular hyperdensity.    Dg Chest 2 View 01/26/2019 IMPRESSION:  Rotated exam without acute abnormality.    Dg Swallowing Func-speech Pathology 01/27/2019  IMPRESSIONS  DIET RECOMMENDATION 01/26/2019  Diet Recommendations - Dysphagia 1 (Puree) solids;Nectar thick liquid Liquid  Administration via Cup;Spoon;No straw Medication Administration Crushed with puree  Follow up Recommendations - Home health SLP;24 hour supervision/assistance          Vas [place] Lower Extremity Venous (dvt) 01/27/2019 Summary:  Right: There is no evidence of deep vein thrombosis in the lower extremity. However, portions of this examination were limited - see technologist comments above. No cystic structure found in the popliteal fossa.  Left: There is no evidence of deep vein thrombosis in the lower extremity. No cystic structure found in the popliteal fossa.     Transthoracic Echocardiogram Bubble Study 01/26/2019 IMPRESSIONS  1. The left ventricle has normal systolic function,  with an ejection fraction of 60-65%. Left ventricular diastolic function could not be evaluated. No evidence of left ventricular regional wall motion abnormalities.  2. The right ventricle has normal systolc function. The cavity was normal. There is no increase in right ventricular wall thickness.  3. Unable to perform bubble study due to poor image quality.  4. The tricuspid valve was grossly normal.  5. The aortic valve is tricuspid.  6. The aortic root is normal in size and structure.  7. No intracardiac thrombi or masses were visualized.   EKG - SR rate 71 BPM. (See cardiology reading for complete  details)   PHYSICAL EXAM  Temp:  [98.4 F (36.9 C)-98.9 F (37.2 C)] 98.5 F (36.9 C) (03/28 1610) Pulse Rate:  [58-72] 72 (03/28 1610) Resp:  [14-18] 18 (03/28 1610) BP: (121-158)/(72-90) 146/84 (03/28 1610) SpO2:  [90 %-95 %] 92 % (03/28 1610)  General - Well nourished, well developed, mildly lethargic.  Ophthalmologic - fundi not visualized due to noncooperation.  Cardiovascular - Regular rate and rhythm.  Neuro - mildly lethargic, but awake alert, orientated to place, time and people, however difficult to tell me her age.  Russian-speaking, paucity of speech but answer question appropriately, able to name and repeat, following all simple commands.  PERRL, EOMI, visual field full.  Mild left facial droop, and tongue protrusion to the left.  Muscle strength bilateral upper extremity symmetrical, however chronic right upper extremity gross intention and postural tremor.  Left lower extremity 4/5 proximal and 4+/5 distal.  Right lower extremity 5/5.  DTR 1+, no Babinski.  Sensation symmetrical.  Coordination intact on the left, however gross intention and postural tremor on the right.  Gait not tested but as per granddaughter, patient has left leg dragging while walking with walker with PT/OT.   ASSESSMENT/PLAN Ms. Daci Stubbe is a 85 y.o. female with history of  coronary artery disease, hypertension, left midbrain and left thalamic stroke in 2015 with some residual diplopia and right-sided tremor presenting with slumped over at home, slurred speech, drooling, difficulty swallowing and left sided weakness. She did not receive IV t-PA due to tele-neurology decision.  Stroke:  Acute infarcts on the right BG/CR and right parietal cortex, Bolick pattern, concerning for cardioembolic source vs. ?? essential thrombocythemia.  Resultant left facial droop and left hemiparesis  CT head - Advanced chronic ischemic disease with progression in both cerebral and cerebellar hemispheres since 2015.  Several bilateral areas are age indeterminate.   MRI head - Acute infarct in the PLIC on the right extending into the caudate body. Small area of acute infarct right parietal cortex.  CTA H&N -unremarkable, bilateral fetal PCA  2D Echo - EF 60 - 65%. No cardiac source of emboli identified.  LE venous Doppler negative for DVT  Recommend outpatient 30-day cardiac event monitoring to rule out A. fib  LDL - 154  HgbA1c - 5.8  VTE prophylaxis - Lovenox  Diet - Dysphagia 1 with nectar thick liquids  aspirin 81 mg daily prior to admission, now on aspirin 81 mg daily and clopidogrel 75 mg daily.  Recommend DAPT for 3 weeks and then Plavix alone.  Patient counseled to be compliant with her antithrombotic medications  Ongoing aggressive stroke risk factor management  Therapy recommendations:  CIR recommended  Disposition:  Pending  Thrombocytosis  Platelet in 2015 running from [phone]  Platelet in 2018 ranging from [phone]  This admission platelet 1024  Concerning for essential thrombocythemia which can related to recurrent stroke  Continue antiplatelet  Discussed with Dr. Waldron Labs, will have oncology/hematology curbside consult  History of stroke  04/2014 admitted for right-sided weakness and diplopia.  MRI showed left midbrain, left thalamic infarcts, as well as bilateral left parietal, right frontal and right cerebellum infarcts.  LDL 88 and A1c 6.4 carotid Doppler 2D echo unremarkable.  MRA negative.  Patient deemed not a candidate for anticoagulation at that time and discharged with aspirin.  08/2014 ED visit for worsening diplopia with left eye INO and right arm intention tremor.  CT no acute abnormality.  Patient and family refused admission for inpatient work-up.  Hypertension  Stable . Permissive hypertension (OK if < 220/120) but gradually normalize in 3-5 days . Long-term BP goal normotensive  Hyperlipidemia  Lipid lowering medication PTA:  none  LDL 154,  goal < 70  Current lipid lowering medication: Lipitor 40 mg daily  Continue statin at discharge  Other Stroke Risk Factors  Advanced age  Family hx stroke (mother)  Coronary artery disease  Other Active Problems  Thrombocytosis - 1,024  Dysphagia  Hospital day # 1  Neurology will sign off. Please call with questions. Pt will follow up with stroke clinic at [facility] with Dr. Leonie Man in about 4 weeks. Thanks for the consult.  Rosalin Hawking, MD PhD Stroke Neurology 01/27/2019 5:36 PM  I spent  35 minutes in total face-to-face time with the patient, more than 50% of which was spent in counseling and coordination of care, reviewing test results, images and medication, and discussing the diagnosis of embolic stroke, recurrent embolic stroke, thrombocythemia, treatment plan and potential prognosis. This patient's care requiresreview of multiple databases, neurological assessment, discussion with family, other specialists and medical decision making of high complexity. I had long discussion with granddaughter at bedside, updated pt current condition, treatment plan and potential prognosis. She expressed understanding and appreciation.  I also discussed with Dr. Waldron Labs.    To contact Stroke Continuity provider, please refer to [email or web address] After hours, contact General Neurology

## 2019-01-27 NOTE — Progress Notes (Signed)
  Speech Language Pathology Treatment: Dysphagia  Patient Details Name: Jill Castaneda MRN: [id number] DOB: 11-29-1927 Today's Date: 01/27/2019 Time: 5993-5701 SLP Time Calculation (min) (ACUTE ONLY): 30 min  Assessment / Plan / Recommendation Clinical Impression  Patient seen to address dysphagia goals with granddaughter present for education. SLP demonstrated and educated granddaughter on oral care, swallow safety, MBS results, prognosis for diet advancement. Granddaughter asked about exercises and SLP suggested that encouraging patient to eat is best for the time being, because just the act of swallowing will help her, and important to not fatigue patient too much during the day. Patient consumed small bites of puree solids and teaspoon sips of nectar thick liquids without overt s/s of aspiration or penetration. She did not have dentures in place but after speaking with granddaughter, it seems as though her dentures are ill-fitting at this time anyway and likely would not provide any benefit. Granddaughter is safe to feed patient and provide oral care and use thickener powder as needed.     HPI HPI: Pt is an 85 year old female who presents with slurred speech and difficulty swallowing. MRI showed  a new posterior limb of the internal capsule stroke on the right as well as a small area of cortical stroke in the right frontal cortex. PMH includes: CAD, HTN, CVA (with expressive aphasia that improved almost back to baseline per granddaughter, no other SLP needs at that time)      SLP Plan  Continue with current plan of care       Recommendations  Diet recommendations: Dysphagia 1 (puree);Nectar-thick liquid Liquids provided via: Teaspoon Medication Administration: Crushed with puree Supervision: Full supervision/cueing for compensatory strategies Compensations: Slow rate;Small sips/bites;Minimize environmental distractions Postural Changes and/or Swallow Maneuvers: Seated upright 90 degrees                 Oral Care Recommendations: Oral care QID Follow up Recommendations: Home health SLP;24 hour supervision/assistance SLP Visit Diagnosis: Dysphagia, oropharyngeal phase (R13.12) Plan: Continue with current plan of care       [place], MA, CCC-SLP Speech Therapy [facility]

## 2019-01-27 NOTE — Progress Notes (Signed)
*  PRELIMINARY RESULTS* Vascular Ultrasound Bilateral lower extremity venous duplex has been completed.  Please see CV Proc tab for preliminary results.  Everrett Coombe 01/27/2019, 4:44 PM

## 2019-01-27 NOTE — Evaluation (Signed)
Occupational Therapy Evaluation Patient Details Name: Jill Castaneda MRN: [id number] DOB: 03/22/1928 Today's Date: 01/27/2019    History of Present Illness Pt is an 85 y/o female admitted secondary to slurred speech. MRI of the brain revealed an acute infarct in the posterior limb of the internal capsule. PMH including but not limited to coronary artery disease, hypertension, left midbrain and left thalamic stroke in 2015 with some residual diplopia and right-sided tremor.   Clinical Impression   Pt PTA: living with family and independent with ADL and mobility; assisted with IADLs. Pt currently, limited by residual double vision in a new environment that pt reports worsening to vision. Pt with weakness on L side and incoordination of LUE and dragging LLE on ground with mobility. Pt performing grooming at sink with  MinA; minA for UB bathing and maxA for LB toilet hygiene and LB ADL. Pt requires increased assist for mobility as she uses RW and is unsafe without maximal cueing for tasks. Pt would greatly benefit from continued OT skilled services for ADL, mobility and safety in CIR setting. OT to follow acutely. Next sessions to focus on ADL, mobility progression and LUE coordination to increase ability to feed self as there are tremors in R arm.   Granddaughter as Optometrist as pt does not respond to ipad.    Follow Up Recommendations  CIR;Supervision/Assistance - 24 hour    Equipment Recommendations  None recommended by OT    Recommendations for Other Services Rehab consult     Precautions / Restrictions Precautions Precautions: Fall Restrictions Weight Bearing Restrictions: No      Mobility Bed Mobility Overal bed mobility: Needs Assistance Bed Mobility: Supine to Sit;Sit to Supine     Supine to sit: Max assist Sit to supine: Max assist   General bed mobility comments: bed in flat position, assistance needed for movement of bilateral LEs onto and off of bed, assistance needed  for trunk management as well  Transfers Overall transfer level: Needs assistance Equipment used: Rolling walker (2 wheeled) Transfers: Sit to/from Stand Sit to Stand: Mod assist;+2 safety/equipment;+2 physical assistance         General transfer comment: assistance for stability with transition into standing from sitting EOB    Balance Overall balance assessment: Needs assistance Sitting-balance support: Feet supported Sitting balance-Leahy Scale: Fair     Standing balance support: Bilateral upper extremity supported Standing balance-Leahy Scale: Poor                             ADL either performed or assessed with clinical judgement   ADL Overall ADL's : Needs assistance/impaired Eating/Feeding: Moderate assistance;Sitting(LUE poor coordination)   Grooming: Minimal assistance;Wash/dry hands;Wash/dry face;Sitting   Upper Body Bathing: Minimal assistance;Standing   Lower Body Bathing: Maximal assistance;Sit to/from stand   Upper Body Dressing : Minimal assistance;Standing   Lower Body Dressing: Maximal assistance;Sitting/lateral leans;Sit to/from stand   Toilet Transfer: Minimal assistance;BSC;Ambulation   Toileting- Clothing Manipulation and Hygiene: Moderate assistance;Sitting/lateral lean;Sit to/from stand       Functional mobility during ADLs: Minimal assistance;+2 for physical assistance;Rolling walker;Cueing for safety;Cueing for sequencing General ADL Comments: Pt requiring additional assist for ADL as pt is very weak in LUE/LLE and RUE with tremors. Pt with low vision- diplopia- already tried glasses taping and pt would not wear them.     Vision Baseline Vision/History: (diplopia) Vision Assessment?: Vision impaired- to be further tested in functional context(diplopia)     Perception  Praxis      Pertinent Vitals/Pain       Hand Dominance Right   Extremity/Trunk Assessment Upper Extremity Assessment Upper Extremity Assessment:  Generalized weakness;RUE deficits/detail;LUE deficits/detail RUE Deficits / Details: tremor RUE Sensation: WNL RUE Coordination: decreased fine motor LUE Deficits / Details: decreased coordination LUE Sensation: WNL LUE Coordination: WNL   Lower Extremity Assessment Lower Extremity Assessment: Generalized weakness;Defer to PT evaluation;LLE deficits/detail LLE Deficits / Details: LLE drags on floor, but WFLs for strength testing   Cervical / Trunk Assessment Cervical / Trunk Assessment: Kyphotic   Communication Communication Communication: Expressive difficulties   Cognition Arousal/Alertness: Awake/alert Behavior During Therapy: Flat affect Overall Cognitive Status: Within Functional Limits for tasks assessed                                     General Comments  Pt lives with granddaughter who assisted only as needed for IADLs mostly- superivsion 24/7 for pt at home. Pt requuiring increased assist for mobility and ADL. Pt unsafe to go home and required intesive OT skilled services.    Exercises     Shoulder Instructions      Home Living Family/patient expects to be discharged to:: Private residence Living Arrangements: Other relatives(granddaughter) Available Help at Discharge: Family;Available 24 hours/day Type of Home: House Home Access: Stairs to enter [employer] of Steps: 4 Entrance Stairs-Rails: Right;Left(gdaughter plans to be on one side of her) Home Layout: Two level Alternate Level Stairs-Number of Steps: 13 steps Alternate Level Stairs-Rails: Left Bathroom Shower/Tub: Other (comment)(pt sponge bathes)   Bathroom Toilet: Handicapped height     Home Equipment: Environmental consultant - 2 wheels;Bedside commode;Toilet riser;Transport chair      Lives With: Family    Prior Functioning/Environment Level of Independence: Independent with assistive device(s)        Comments: ambulates with RW, dresses and bathes herself        OT Problem  List: Decreased strength;Decreased activity tolerance;Impaired balance (sitting and/or standing);Impaired vision/perception;Decreased coordination;Decreased safety awareness;Impaired UE functional use      OT Treatment/Interventions: Self-care/ADL training;Therapeutic exercise;Neuromuscular education;Energy conservation;DME and/or AE instruction;Therapeutic activities;Patient/family education;Balance training;Visual/perceptual remediation/compensation    OT Goals(Current goals can be found in the care plan section) Acute Rehab OT Goals Patient Stated Goal: per granddaughter - to regain as much function as possible OT Goal Formulation: With family Time For Goal Achievement: 02/10/19 Potential to Achieve Goals: Good ADL Goals Pt Will Perform Eating: with adaptive utensils;sitting;with set-up Pt Will Perform Grooming: with modified independence;standing Pt Will Perform Lower Body Dressing: with modified independence;sit to/from stand Pt Will Perform Toileting - Clothing Manipulation and hygiene: with modified independence;sit to/from stand  OT Frequency: Min 3X/week   Barriers to D/C:            Co-evaluation              AM-PAC OT "6 Clicks" Daily Activity     Outcome Measure Help from another person eating meals?: A Lot Help from another person taking care of personal grooming?: A Little Help from another person toileting, which includes using toliet, bedpan, or urinal?: A Lot Help from another person bathing (including washing, rinsing, drying)?: A Lot Help from another person to put on and taking off regular upper body clothing?: A Little Help from another person to put on and taking off regular lower body clothing?: A Lot 6 Click Score: 14   End of Session Equipment Utilized  During Treatment: Gait belt;Rolling walker Nurse Communication: Mobility status  Activity Tolerance: Patient tolerated treatment well Patient left: in bed;with call bell/phone within reach;with bed  alarm set  OT Visit Diagnosis: Unsteadiness on feet (R26.81);Muscle weakness (generalized) (M62.81);Cognitive communication deficit (R41.841);Hemiplegia and hemiparesis Symptoms and signs involving cognitive functions: Cerebral infarction Hemiplegia - Right/Left: Left Hemiplegia - dominant/non-dominant: Non-Dominant Hemiplegia - caused by: Cerebral infarction                Time: 8550-1586 OT Time Calculation (min): 26 min Charges:  OT General Charges $OT Visit: 1 Visit OT Evaluation $OT Eval Moderate Complexity: 1 Mod OT Treatments $Self Care/Home Management : 8-22 mins  Ebony Hail Harold Hedge) Marsa Aris OTR/L Acute Rehabilitation Services Pager: [phone] Office: [phone]   Fredda Hammed 01/27/2019, 11:22 AM

## 2019-01-27 NOTE — Progress Notes (Signed)
Physical Therapy Treatment Patient Details Name: Jill Castaneda MRN: [id number] DOB: 06-Jun-1928 Today's Date: 01/27/2019    History of Present Illness Pt is an 85 y/o female admitted secondary to slurred speech. MRI of the brain revealed an acute infarct in the posterior limb of the internal capsule. PMH including but not limited to coronary artery disease, hypertension, left midbrain and left thalamic stroke in 2015 with some residual diplopia and right-sided tremor.    PT Comments    Pt making steady progress with functional mobility. She tolerated ambulating a further distance and required less overall physical assistance than previous session. Pt's granddaughter was present throughout, very engaged and active in care. Pt would continue to benefit from skilled physical therapy services at this time while admitted and after d/c to address the below listed limitations in order to improve overall safety and independence with functional mobility.   Follow Up Recommendations  CIR     Equipment Recommendations  None recommended by PT    Recommendations for Other Services       Precautions / Restrictions Precautions Precautions: Fall Restrictions Weight Bearing Restrictions: No    Mobility  Bed Mobility Overal bed mobility: Needs Assistance Bed Mobility: Supine to Sit;Sit to Supine     Supine to sit: Mod assist Sit to supine: Min assist   General bed mobility comments: increased time and effort, assistance needed for bilateral LE movement off of bed and for trunk elevation; min A to return L LE onto bed  Transfers Overall transfer level: Needs assistance Equipment used: Rolling walker (2 wheeled) Transfers: Sit to/from Stand Sit to Stand: Min assist;+2 safety/equipment;+2 physical assistance         General transfer comment: assistance for stability with transition into standing from sitting EOB; verbal and tactile cueing for safe hand  placement  Ambulation/Gait Ambulation/Gait assistance: Min assist Gait Distance (Feet): 30 Feet Assistive device: Rolling walker (2 wheeled) Gait Pattern/deviations: Decreased step length - left;Decreased stance time - right;Decreased stride length;Decreased dorsiflexion - left;Decreased weight shift to left;Trunk flexed;Step-to pattern;Step-through pattern Gait velocity: decreased   General Gait Details: pt with intermittent step through on L LE but decreased with fatigue and required verbal and tactile cueing; pt required constant min A for stability, safety with navigation around obstacles and RW management   Stairs             Wheelchair Mobility    Modified Rankin (Stroke Patients Only) Modified Rankin (Stroke Patients Only) Pre-Morbid Rankin Score: Moderate disability Modified Rankin: Moderately severe disability     Balance Overall balance assessment: Needs assistance Sitting-balance support: Feet supported Sitting balance-Leahy Scale: Fair     Standing balance support: Bilateral upper extremity supported Standing balance-Leahy Scale: Poor                              Cognition Arousal/Alertness: Awake/alert Behavior During Therapy: WFL for tasks assessed/performed Overall Cognitive Status: Within Functional Limits for tasks assessed                                        Exercises      General Comments General comments (skin integrity, edema, etc.): Pt lives with granddaughter who assisted only as needed for IADLs mostly- superivsion 24/7 for pt at home. Pt requuiring increased assist for mobility and ADL. Pt unsafe to go home and required intesive  OT skilled services.      Pertinent Vitals/Pain Pain Assessment: No/denies pain    Home Living Family/patient expects to be discharged to:: Private residence Living Arrangements: Other relatives(granddaughter) Available Help at Discharge: Family;Available 24 hours/day Type of  Home: House Home Access: Stairs to enter Entrance Stairs-Rails: Right;Left(gdaughter plans to be on one side of her) Home Layout: Two level Home Equipment: Walker - 2 wheels;Bedside commode;Toilet riser;Transport chair      Prior Function Level of Independence: Independent with assistive device(s)      Comments: ambulates with RW, dresses and bathes herself   PT Goals (current goals can now be found in the care plan section) Acute Rehab PT Goals Patient Stated Goal: per granddaughter - to regain as much function as possible PT Goal Formulation: With family Time For Goal Achievement: 02/09/19 Potential to Achieve Goals: Good Progress towards PT goals: Progressing toward goals    Frequency    Min 4X/week      PT Plan Current plan remains appropriate    Co-evaluation              AM-PAC PT "6 Clicks" Mobility   Outcome Measure  Help needed turning from your back to your side while in a flat bed without using bedrails?: A Lot Help needed moving from lying on your back to sitting on the side of a flat bed without using bedrails?: A Lot Help needed moving to and from a bed to a chair (including a wheelchair)?: A Little Help needed standing up from a chair using your arms (e.g., wheelchair or bedside chair)?: A Little Help needed to walk in hospital room?: A Lot Help needed climbing 3-5 steps with a railing? : Total 6 Click Score: 13    End of Session   Activity Tolerance: Patient tolerated treatment well Patient left: in bed;with call bell/phone within reach;with family/visitor present Nurse Communication: Mobility status PT Visit Diagnosis: Muscle weakness (generalized) (M62.81)     Time: 2505-3976 PT Time Calculation (min) (ACUTE ONLY): 24 min  Charges:  $Gait Training: 8-22 mins $Therapeutic Activity: 8-22 mins                     Sherie Don, PT, DPT  Acute Rehabilitation Services Pager [phone] Office [place] 01/27/2019, 11:43 AM

## 2019-01-27 NOTE — Progress Notes (Signed)
PROGRESS NOTE                                                                                                                                                                                                             Patient Demographics:    Jill Castaneda, is a 85 y.o. female, DOB - 1928/09/06, HAF:[id number]  Admit date - 01/25/2019   Admitting Physician Vianne Bulls, MD  Outpatient Primary MD for the patient is Patient, No Pcp Per  LOS - 1   Chief Complaint  Patient presents with   Aphasia       Brief Narrative    85 y.o. female with medical history significant for coronary artery disease, multiple strokes with residual deficits, hypertension, and history of PE after a hip replacement, now presenting to emergency department for evaluation of generalized weakness and speech disturbance, her work-up significant for acute CVA.   Subjective:    [name] today has, No headache, No chest pain, No abdominal pain - No Nausea, passed swallow evaluation this morning and breakfast with her granddaughter help.   Assessment  & Plan :    Principal Problem:   Dysarthria Active Problems:   Hypertension   Thrombocytosis (HCC)   Coronary artery disease   History of stroke with residual deficit   CVA (cerebral vascular accident) ([place])  Acute CVA -MRI showing acute infarct in the posterior limb of internal capsule on the right extending into the caudate body, with small area of acute infarct right parietal cortex, patient main complaints is dysarthria, as well she has significant dysphagia, failed swallow evaluation yesterday, but she is advanced to dysphagia 1 with thickened liquids today. -CTA head and neck with no evidence of large vessel occlusion -D echo with a preserved EF, and no evidence of embolic sourceat - Consult with PT/OT/SLP  -Continue with aspirin and Plavix -By PT, recommendation for CIR -Globin A1c at 5.8, DL is  154  Hyperipidemia -LDL is 154, currently on statin  Hypertension  - DBP in low 100's in ED  - Permit HTN to 329/191 while evaluating for possible acute CVA    CAD  - No anginal complaints  - Continue ASA    Thrombocytosis  - Platelets 1024k on admission, up from priors in 500-800k range  - Repeat CBC in am with smear review, check iron-studies,  ESR, and CRP      Code Status : DNR  Family Communication  : grandaughter at bedside  Disposition Plan  : CIR  Consults  :  neurolgoy  Procedures  : None  DVT Prophylaxis  :  Baird lovenox  Lab Results  Component Value Date   PLT 1,024 (HH) 01/25/2019    Antibiotics  :    Anti-infectives (From admission, onward)   None        Objective:   Vitals:   01/26/19 2008 01/26/19 2305 01/27/19 0412 01/27/19 0800  BP: (!) 158/90 (!) 142/80 140/79 121/72  Pulse: 67 63 (!) 58 66  Resp: 16 14 16 16   Temp: 98.9 F (37.2 C)  98.4 F (36.9 C) 98.8 F (37.1 C)  TempSrc: Oral  Oral Oral  SpO2: 91% 91% 95% 93%  Weight:      Height:        Wt Readings from Last 3 Encounters:  01/26/19 45.1 kg  06/23/17 47.2 kg  06/19/17 42.2 kg    No intake or output data in the 24 hours ending 01/27/19 1144   Physical Exam  Awake Alert, patient with significant dysarthria, Russian-speaking, granddaughter at bedside.  Symmetrical Chest wall movement, Good air movement bilaterally, CTAB RRR,No Gallops,Rubs or new Murmurs, No Parasternal Heave +ve B.Sounds, Abd Soft, No tenderness,  No rebound - guarding or rigidity. No Cyanosis, Clubbing or edema, No new Rash or bruise      Data Review:    CBC Recent Labs  Lab 01/25/19 2245  WBC 8.5  HGB 16.7*  HCT 53.6*  PLT 1,024*  MCV 87.9  MCH 27.4  MCHC 31.2  RDW 16.5*  LYMPHSABS 1.2  MONOABS 0.6  EOSABS 0.2  BASOSABS 0.1    Chemistries  Recent Labs  Lab 01/25/19 2245 01/25/19 2254  NA 139  --   K 4.1  --   CL 102  --   CO2 26  --   GLUCOSE 106*  --   BUN 19  --    CREATININE 0.96 0.90  CALCIUM 9.8  --   AST 32  --   ALT 15  --   ALKPHOS 59  --   BILITOT 0.8  --    ------------------------------------------------------------------------------------------------------------------ Recent Labs    01/25/19 2255  CHOL 234*  HDL 62  LDLCALC 154*  TRIG 88  CHOLHDL 3.8    Lab Results  Component Value Date   HGBA1C 5.8 (H) 01/25/2019   ------------------------------------------------------------------------------------------------------------------ Recent Labs    01/26/19 0712  TSH 7.553*   ------------------------------------------------------------------------------------------------------------------ Recent Labs    01/26/19 0712  VITAMINB12 359    Coagulation profile Recent Labs  Lab 01/25/19 2245  INR 0.9    No results for input(s): DDIMER in the last 72 hours.  Cardiac Enzymes No results for input(s): CKMB, TROPONINI, MYOGLOBIN in the last 168 hours.  Invalid input(s): CK ------------------------------------------------------------------------------------------------------------------    Component Value Date/Time   BNP 144.1 (H) 06/23/2017 1741    Inpatient Medications  Scheduled Meds:  aspirin EC  81 mg Oral Daily   atorvastatin  40 mg Oral q1800   clopidogrel  75 mg Oral Daily   enoxaparin (LOVENOX) injection  30 mg Subcutaneous Q24H   Continuous Infusions: PRN Meds:.acetaminophen **OR** acetaminophen (TYLENOL) oral liquid 160 mg/5 mL **OR** acetaminophen, labetalol, Resource ThickenUp Clear, senna-docusate  Micro Results No results found for this or any previous visit (from the past 240 hour(s)).  Radiology Reports Ct Angio Head W Or Wo Contrast  Result Date: 01/26/2019 CLINICAL DATA:  Stroke EXAM: CT ANGIOGRAPHY HEAD AND NECK TECHNIQUE: Multidetector CT imaging of the head and neck was performed using the standard protocol during bolus administration of intravenous contrast. Multiplanar CT image  reconstructions and MIPs were obtained to evaluate the vascular anatomy. Carotid stenosis measurements (when applicable) are obtained utilizing NASCET criteria, using the distal internal carotid diameter as the denominator. CONTRAST:  66m OMNIPAQUE IOHEXOL 350 MG/ML SOLN COMPARISON:  MRI head 01/26/2019.  CT head 01/25/2019 FINDINGS: CTA NECK FINDINGS Aortic arch: Mild atherosclerotic disease aortic arch without aneurysm or dissection. Proximal great vessels widely patent Right carotid system: Widely patent without atherosclerotic disease or dissection Left carotid system: Widely patent without atherosclerotic disease or dissection Vertebral arteries: Both vertebral arteries widely patent to the basilar Skeleton: Mild cervical spine degenerative change. No acute skeletal abnormality. Other neck: Negative Upper chest: Lung apices clear bilaterally. Review of the MIP images confirms the above findings CTA HEAD FINDINGS Anterior circulation: No significant stenosis, proximal occlusion, aneurysm, or vascular malformation. Posterior circulation: No significant stenosis, proximal occlusion, aneurysm, or vascular malformation. Venous sinuses: Not well evaluated due to arterial phase scanning Anatomic variants: None Delayed phase: No acute intracranial abnormality. Extensive chronic microvascular ischemia. Review of the MIP images confirms the above findings IMPRESSION: 1. Negative for large vessel occlusion. Acute infarcts on the right are consistent with small vessel occlusion. Extensive chronic microvascular ischemic change in the brain 2. No significant carotid or vertebral atherosclerotic disease in the neck. Electronically Signed   By: CFranchot GalloM.D.   On: 01/26/2019 13:07   Dg Chest 2 View  Result Date: 01/26/2019 CLINICAL DATA:  Altered mental status. EXAM: CHEST - 2 VIEW COMPARISON:  Chest radiograph 06/19/2017. CT 06/23/2017 FINDINGS: The patient is rotated. Heart size normal for technique. Aortic  tortuosity. No pulmonary edema. No focal airspace disease. No pleural effusion or pneumothorax. Bones are under mineralized with scoliosis. IMPRESSION: Rotated exam without acute abnormality. Electronically Signed   By: MKeith RakeM.D.   On: 01/26/2019 00:14   Ct Angio Neck W Or Wo Contrast  Result Date: 01/26/2019 CLINICAL DATA:  Stroke EXAM: CT ANGIOGRAPHY HEAD AND NECK TECHNIQUE: Multidetector CT imaging of the head and neck was performed using the standard protocol during bolus administration of intravenous contrast. Multiplanar CT image reconstructions and MIPs were obtained to evaluate the vascular anatomy. Carotid stenosis measurements (when applicable) are obtained utilizing NASCET criteria, using the distal internal carotid diameter as the denominator. CONTRAST:  570mOMNIPAQUE IOHEXOL 350 MG/ML SOLN COMPARISON:  MRI head 01/26/2019.  CT head 01/25/2019 FINDINGS: CTA NECK FINDINGS Aortic arch: Mild atherosclerotic disease aortic arch without aneurysm or dissection. Proximal great vessels widely patent Right carotid system: Widely patent without atherosclerotic disease or dissection Left carotid system: Widely patent without atherosclerotic disease or dissection Vertebral arteries: Both vertebral arteries widely patent to the basilar Skeleton: Mild cervical spine degenerative change. No acute skeletal abnormality. Other neck: Negative Upper chest: Lung apices clear bilaterally. Review of the MIP images confirms the above findings CTA HEAD FINDINGS Anterior circulation: No significant stenosis, proximal occlusion, aneurysm, or vascular malformation. Posterior circulation: No significant stenosis, proximal occlusion, aneurysm, or vascular malformation. Venous sinuses: Not well evaluated due to arterial phase scanning Anatomic variants: None Delayed phase: No acute intracranial abnormality. Extensive chronic microvascular ischemia. Review of the MIP images confirms the above findings IMPRESSION: 1.  Negative for large vessel occlusion. Acute infarcts on the right are consistent with small vessel occlusion. Extensive chronic microvascular ischemic  change in the brain 2. No significant carotid or vertebral atherosclerotic disease in the neck. Electronically Signed   By: Franchot Gallo M.D.   On: 01/26/2019 13:07   Mr Brain Wo Contrast  Result Date: 01/26/2019 CLINICAL DATA:  Dysarthria.  History of stroke. EXAM: MRI HEAD WITHOUT CONTRAST TECHNIQUE: Multiplanar, multiecho pulse sequences of the brain and surrounding structures were obtained without intravenous contrast. COMPARISON:  CT head 01/25/2019.  MRI head 03/09/2014 FINDINGS: Brain: Acute infarct in the right basal ganglia involving the posterior limb internal capsule and caudate body. Small area of acute infarct in the right parietal cortex. Mild atrophy. Extensive chronic ischemic changes throughout the white matter. Chronic infarcts in the thalamus bilaterally. Small chronic infarcts in the cerebellum bilaterally. Chronic infarct in the left midbrain. Negative for hemorrhage mass or midline shift. Vascular: Normal arterial flow voids Skull and upper cervical spine: Negative Sinuses/Orbits: Mild mucosal edema paranasal sinuses. Bilateral cataract surgery Other: None IMPRESSION: Acute infarct in the posterior limb internal capsule on the right extending into the caudate body. Small area of acute infarct right parietal cortex Advanced chronic microvascular ischemia. Electronically Signed   By: Franchot Gallo M.D.   On: 01/26/2019 07:10   Dg Swallowing Func-speech Pathology  Result Date: 01/27/2019 Objective Swallowing Evaluation: Type of Study: MBS-Modified Barium Swallow Study  Patient Details Name: Michaelene Dutan MRN: [id number] Date of Birth: Jun 25, 1928 Today's Date: 01/27/2019 Time: SLP Start Time (ACUTE ONLY): 1211 -SLP Stop Time (ACUTE ONLY): 1234 SLP Time Calculation (min) (ACUTE ONLY): 23 min Past Medical History: Past Medical History: Diagnosis  Date  Cataract   Complication of anesthesia   Difficulty waking up "  Coronary artery disease   Hip fracture ([place]) 06/2017  right hip  Hypertension   Osteoporosis   Stroke [facility])  Past Surgical History: Past Surgical History: Procedure Laterality Date  ANTERIOR APPROACH HEMI HIP ARTHROPLASTY Right 06/20/2017  Procedure: ANTERIOR APPROACH HEMI HIP ARTHROPLASTY;  Surgeon: Rod Can, MD;  Location: [place];  Service: Orthopedics;  Laterality: Right;  FRACTURE SURGERY    SHOULDER HARDWARE REMOVAL Left   SHOULDER HEMI-ARTHROPLASTY Left   TOTAL HIP ARTHROPLASTY Right  HPI: Pt is an 85 year old female who presents with slurred speech and difficulty swallowing. MRI showed  a new posterior limb of the internal capsule stroke on the right as well as a small area of cortical stroke in the right frontal cortex. PMH includes: CAD, HTN, CVA (with expressive aphasia that improved almost back to baseline per granddaughter, no other SLP needs at that time)  Subjective: alert, soft speech, also slurred per family that is interpreting Assessment / Plan / Recommendation CHL IP CLINICAL IMPRESSIONS 01/26/2019 Clinical Impression Pt has a moderate oral and mild pharyngeal dysphagia with generalized fatigue that also likely contributes to aspiration risk. Her granddaughter was present for education as well as translation. She will only take very small bites/sips, resisting SLP if attempting to provide larger boluses, and she could not suck thickened liquid up via straw. She has weak lingual manipulation that results in poor bolus cohesion, incomplete posterior transit that leaves oral residue, and premature spillage that reaches the pyriform sinuses before the swallow. Premature spillage with thin liquids spills directly into the airway before the swallow. Pt has a cough reaction (1-2 seconds delayed) but this does not move aspirated material. Nectar thick liquids and purees are better contained above the valleculae and do  not enter the airway. Recommend starting Dys 1 diet and nectar thick liquids by cup or spoon.  Suspect that pt will need smaller, more frequent offerings of food/liquid so that she does not fatigue across a meal, as she appeared to fatigue even during this study. Fatigue could further increase her risk of aspiration. Diet and precautions were reviewed with handouts provided. Will continue to follow acutely. SLP Visit Diagnosis Dysphagia, oropharyngeal phase (R13.12) Attention and concentration deficit following -- Frontal lobe and executive function deficit following -- Impact on safety and function Mild aspiration risk;Moderate aspiration risk;Risk for inadequate nutrition/hydration   CHL IP TREATMENT RECOMMENDATION 01/26/2019 Treatment Recommendations Therapy as outlined in treatment plan below   Prognosis 01/26/2019 Prognosis for Safe Diet Advancement Good Barriers to Reach Goals -- Barriers/Prognosis Comment -- CHL IP DIET RECOMMENDATION 01/26/2019 SLP Diet Recommendations Dysphagia 1 (Puree) solids;Nectar thick liquid Liquid Administration via Cup;Spoon;No straw Medication Administration Crushed with puree Compensations Slow rate;Small sips/bites Postural Changes Seated upright at 90 degrees   CHL IP OTHER RECOMMENDATIONS 01/26/2019 Recommended Consults -- Oral Care Recommendations Oral care BID Other Recommendations Order thickener from pharmacy;Prohibited food (jello, ice cream, thin soups);Remove water pitcher   CHL IP FOLLOW UP RECOMMENDATIONS 01/26/2019 Follow up Recommendations Home health SLP;24 hour supervision/assistance   CHL IP FREQUENCY AND DURATION 01/26/2019 Speech Therapy Frequency (ACUTE ONLY) min 2x/week Treatment Duration 2 weeks      CHL IP ORAL PHASE 01/26/2019 Oral Phase Impaired Oral - Pudding Teaspoon -- Oral - Pudding Cup -- Oral - Honey Teaspoon -- Oral - Honey Cup -- Oral - Nectar Teaspoon Weak lingual manipulation;Reduced posterior propulsion;Lingual/palatal residue;Delayed oral  transit;Decreased bolus cohesion;Premature spillage Oral - Nectar Cup Weak lingual manipulation;Reduced posterior propulsion;Lingual/palatal residue;Delayed oral transit;Decreased bolus cohesion;Premature spillage Oral - Nectar Straw -- Oral - Thin Teaspoon Weak lingual manipulation;Reduced posterior propulsion;Lingual/palatal residue;Delayed oral transit;Decreased bolus cohesion;Premature spillage Oral - Thin Cup -- Oral - Thin Straw -- Oral - Puree Weak lingual manipulation;Reduced posterior propulsion;Lingual/palatal residue;Delayed oral transit;Decreased bolus cohesion;Premature spillage Oral - Mech Soft -- Oral - Regular -- Oral - Multi-Consistency -- Oral - Pill -- Oral Phase - Comment --  CHL IP PHARYNGEAL PHASE 01/26/2019 Pharyngeal Phase Impaired Pharyngeal- Pudding Teaspoon -- Pharyngeal -- Pharyngeal- Pudding Cup -- Pharyngeal -- Pharyngeal- Honey Teaspoon -- Pharyngeal -- Pharyngeal- Honey Cup -- Pharyngeal -- Pharyngeal- Nectar Teaspoon Reduced tongue base retraction Pharyngeal -- Pharyngeal- Nectar Cup Reduced tongue base retraction Pharyngeal -- Pharyngeal- Nectar Straw -- Pharyngeal -- Pharyngeal- Thin Teaspoon Reduced tongue base retraction;Penetration/Aspiration before swallow Pharyngeal Material enters airway, passes BELOW cords and not ejected out despite cough attempt by patient Pharyngeal- Thin Cup -- Pharyngeal -- Pharyngeal- Thin Straw -- Pharyngeal -- Pharyngeal- Puree Reduced tongue base retraction;Pharyngeal residue - valleculae Pharyngeal -- Pharyngeal- Mechanical Soft -- Pharyngeal -- Pharyngeal- Regular -- Pharyngeal -- Pharyngeal- Multi-consistency -- Pharyngeal -- Pharyngeal- Pill -- Pharyngeal -- Pharyngeal Comment --  CHL IP CERVICAL ESOPHAGEAL PHASE 01/26/2019 Cervical Esophageal Phase WFL Pudding Teaspoon -- Pudding Cup -- Honey Teaspoon -- Honey Cup -- Nectar Teaspoon -- Nectar Cup -- Nectar Straw -- Thin Teaspoon -- Thin Cup -- Thin Straw -- Puree -- Mechanical Soft -- Regular --  Multi-consistency -- Pill -- Cervical Esophageal Comment -- Dannial Monarch 01/27/2019, 11:08 AM              Ct Head Code Stroke Wo Contrast  Result Date: 01/25/2019 CLINICAL DATA:  Code stroke. 85 year old female with slurred speech and right side weakness. EXAM: CT HEAD WITHOUT CONTRAST TECHNIQUE: Contiguous axial images were obtained from the base of the skull through the vertex without intravenous contrast.  COMPARISON:  Head CT 08/18/2014. Brain MRI 03/09/2014. FINDINGS: Brain: Cerebral volume is not significantly changed. No ventriculomegaly. No acute intracranial hemorrhage identified. No midline shift, mass effect, or evidence of intracranial mass lesion. 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 Small areas of cortical and subcortical white matter hypodensity/encephalomalacia in the posterior left MCA territory on series 2, image 20 and 18 appear increased but not brand new. There is a similar chronic appearing much smaller right parietal cortical infarct best seen on sagittal image 8. No other cortically based infarct identified. Vascular: Tortuous. Calcified atherosclerosis at the skull base. No suspicious intracranial vascular hyperdensity. Skull: No acute osseous abnormality identified. Sinuses/Orbits: Visualized paranasal sinuses and mastoids are stable and well pneumatized. Other: Small nonspecific left forehead scalp soft tissue polypoid lesion on series 3, image 52 is new since 2015 but probably benign. Interval postoperative changes to both globes. ASPECTS [facility] Stroke Program Early CT Score) - Ganglionic level infarction (caudate, lentiform nuclei, internal capsule, insula, M1-M3 cortex): - Supraganglionic infarction (M4-M6 cortex): Total score (0-10 with 10 being normal): IMPRESSION: 1. Advanced chronic ischemic disease with progression in  both cerebral and cerebellar hemispheres since 2015. Several bilateral areas are age indeterminate. 2. No acute intracranial hemorrhage or mass effect. 3.  No suspicious intracranial vascular hyperdensity. Study discussed by telephone with Dr. Virgel Manifold on 01/25/2019 at 22:40 . Electronically Signed   By: Genevie Ann M.D.   On: 01/25/2019 22:41     Phillips Climes M.D on 01/27/2019 at 11:44 AM  Between 7am to 7pm - Pager - [phone]  After 7pm go to [email or web address] - password [facility]  [facility] -  Office  [phone]

## 2019-01-28 DIAGNOSIS — I2510 Atherosclerotic heart disease of native coronary artery without angina pectoris: Secondary | ICD-10-CM

## 2019-01-28 DIAGNOSIS — R0602 Shortness of breath: Secondary | ICD-10-CM

## 2019-01-28 DIAGNOSIS — D471 Chronic myeloproliferative disease: Secondary | ICD-10-CM

## 2019-01-28 DIAGNOSIS — I10 Essential (primary) hypertension: Secondary | ICD-10-CM

## 2019-01-28 DIAGNOSIS — D473 Essential (hemorrhagic) thrombocythemia: Secondary | ICD-10-CM

## 2019-01-28 DIAGNOSIS — I63411 Cerebral infarction due to embolism of right middle cerebral artery: Secondary | ICD-10-CM

## 2019-01-28 DIAGNOSIS — Z86711 Personal history of pulmonary embolism: Secondary | ICD-10-CM

## 2019-01-28 DIAGNOSIS — R471 Dysarthria and anarthria: Secondary | ICD-10-CM

## 2019-01-28 LAB — BASIC METABOLIC PANEL
Anion gap: 7 (ref 5–15)
BUN: 23 mg/dL (ref 8–23)
CO2: 24 mmol/L (ref 22–32)
CREATININE: 1.2 mg/dL — AB (ref 0.44–1.00)
Calcium: 8.9 mg/dL (ref 8.9–10.3)
Chloride: 109 mmol/L (ref 98–111)
GFR calc Af Amer: 46 mL/min — ABNORMAL LOW (ref >60)
GFR calc non Af Amer: 40 mL/min — ABNORMAL LOW (ref >60)
Glucose, Bld: 98 mg/dL (ref 70–99)
Potassium: 3.7 mmol/L (ref 3.5–5.1)
Sodium: 140 mmol/L (ref 135–145)

## 2019-01-28 LAB — CBC
HEMATOCRIT: 49.5 % — AB (ref 36.0–46.0)
Hemoglobin: 15.5 g/dL — ABNORMAL HIGH (ref 12.0–15.0)
MCH: 27.1 pg (ref 26.0–34.0)
MCHC: 31.3 g/dL (ref 30.0–36.0)
MCV: 86.4 fL (ref 80.0–100.0)
Platelets: 928 10*3/uL (ref 150–400)
RBC: 5.73 MIL/uL — ABNORMAL HIGH (ref 3.87–5.11)
RDW: 16.3 % — AB (ref 11.5–15.5)
WBC: 9.1 10*3/uL (ref 4.0–10.5)
nRBC: 0 % (ref 0.0–0.2)

## 2019-01-28 LAB — SAVE SMEAR(SSMR), FOR PROVIDER SLIDE REVIEW

## 2019-01-28 LAB — TECHNOLOGIST SMEAR REVIEW

## 2019-01-28 MED ORDER — HYDROXYUREA 500 MG PO CAPS: 500.0000 mg | ORAL_CAPSULE | Freq: Every day | ORAL | Status: DC

## 2019-01-28 MED ORDER — SODIUM CHLORIDE 0.9 % IV SOLN
INTRAVENOUS | Status: DC
  Administered 2019-01-28 – 2019-01-29 (×2): via INTRAVENOUS

## 2019-01-28 NOTE — Consult Note (Addendum)
[place]  Telephone:([phone]) [place]  DOB: 1928/03/13  MR#: [id number]  CSN#: [id number]    Requesting Physician: [facility] Dr. Waldron Labs   Patient Care Team: Patient, No Pcp Per as PCP - General (General Practice)  Reason for consult: Thrombocytosis  History of present illness:   85 year old [place] female, with past medical history of coronary artery disease, 2 episodes of stroke with residual deficits, hypertension, history of PE after hip replacement, was admitted to [facility] for acute CVA, I was called to evaluate her worsening thrombocytosis.  Patient is accompanied by her granddaughter Vicente Males in the hospital room, who served as a interpreter for her.  She was found to have mild thrombocytosis with platelet in 400-500K range in 2015, and it has been gradually worse since then.  Her primary care physician previously referred her to hematology, but she has never made his appointment.   She lives with her granddaughter Vicente Males, does have residual right-sided weakness from previous stroke, but relatively independent at home.  She presented with acute mental status change, and slurred speech.  She was brought to the hospital, MRI showed acute infarct in the posterior limb internal capsule on the right, small area of acute infarct right parietal cortex.  Her CBC showed normal WBC, hemoglobin 16.7, Platelet 1024K.  She was evaluated by neurology service, seen by speech therapist, on dysphagia with thickened liquid diet.   MEDICAL HISTORY:  Past Medical History:  Diagnosis Date   Cataract    Complication of anesthesia    Difficulty waking up "   Coronary artery disease    Hip fracture ([place]) 06/2017   right hip   Hypertension    Osteoporosis    Stroke [facility])     SURGICAL HISTORY: Past Surgical History:  Procedure Laterality Date   ANTERIOR APPROACH HEMI HIP ARTHROPLASTY Right  06/20/2017   Procedure: ANTERIOR APPROACH HEMI HIP ARTHROPLASTY;  Surgeon: Rod Can, MD;  Location: [place];  Service: Orthopedics;  Laterality: Right;   FRACTURE SURGERY     SHOULDER HARDWARE REMOVAL Left    SHOULDER HEMI-ARTHROPLASTY Left    TOTAL HIP ARTHROPLASTY Right     SOCIAL HISTORY: Social History   Socioeconomic History   Marital status: Divorced    Spouse name: Not on file   Number of children: 1   Years of education: masters   Highest education level: Not on file  Occupational History   Occupation: retired  Scientist, product/process development strain: Not on file   Food insecurity:    Worry: Not on file    Inability: Not on file   Transportation needs:    Medical: Not on file    Non-medical: Not on file  Tobacco Use   Smoking status: Never Smoker   Smokeless tobacco: Never Used  Substance and Sexual Activity   Alcohol use: No   Drug use: No   Sexual activity: Never  Lifestyle   Physical activity:    Days per week: Not on file    Minutes per session: Not on file   Stress: Not on file  Relationships   Social connections:    Talks on phone: Not on file    Gets together: Not on file    Attends religious service: Not on file    Active member of club or organization: Not on file    Attends meetings of clubs or organizations: Not on file  Relationship status: Not on file   Intimate partner violence:    Fear of current or ex partner: Not on file    Emotionally abused: Not on file    Physically abused: Not on file    Forced sexual activity: Not on file  Other Topics Concern   Not on file  Social History Narrative   Patient lives with granddaughter, patient drinks caffeine daily.    FAMILY HISTORY: Family History  Problem Relation Age of Onset   Hypertension Father    Heart disease Father    Hypertension Sister    Heart disease Sister    Stroke Mother    Cancer Brother     ALLERGIES:  has No Known  Allergies.  MEDICATIONS:  Current Facility-Administered Medications  Medication Dose Route Frequency Provider Last Rate Last Dose   0.9 %  sodium chloride infusion   Intravenous Continuous Elgergawy, Silver Huguenin, MD 50 mL/hr at 01/28/19 0955     acetaminophen (TYLENOL) tablet 650 mg  650 mg Oral Q4H PRN Opyd, Ilene Qua, MD       Or   acetaminophen (TYLENOL) solution 650 mg  650 mg Per Tube Q4H PRN Opyd, Ilene Qua, MD       Or   acetaminophen (TYLENOL) suppository 650 mg  650 mg Rectal Q4H PRN Opyd, Ilene Qua, MD       aspirin EC tablet 81 mg  81 mg Oral Daily Rosalin Hawking, MD   81 mg at 01/28/19 0951   atorvastatin (LIPITOR) tablet 40 mg  40 mg Oral q1800 Rosalin Hawking, MD   40 mg at 01/27/19 1738   clopidogrel (PLAVIX) tablet 75 mg  75 mg Oral Daily Rosalin Hawking, MD   75 mg at 01/28/19 0951   enoxaparin (LOVENOX) injection 30 mg  30 mg Subcutaneous Q24H Opyd, Ilene Qua, MD   30 mg at 01/28/19 7564   labetalol (NORMODYNE,TRANDATE) injection 5 mg  5 mg Intravenous Q2H PRN Opyd, Ilene Qua, MD       Resource ThickenUp Clear   Oral PRN Geradine Girt, DO       senna-docusate (Senokot-S) tablet 1 tablet  1 tablet Oral QHS PRN Opyd, Ilene Qua, MD        REVIEW OF SYSTEMS:   Constitutional: Denies fevers, chills or abnormal night sweats Eyes: Denies blurriness of vision, double vision or watery eyes Ears, nose, mouth, throat, and face: Denies mucositis or sore throat Respiratory: Denies cough, dyspnea or wheezes Cardiovascular: Denies palpitation, chest discomfort or lower extremity swelling Gastrointestinal:  Denies nausea, heartburn or change in bowel habits Skin: Denies abnormal skin rashes Lymphatics: Denies new lymphadenopathy or easy bruising Neurological:Denies numbness, tingling or new weaknesses Behavioral/Psych: Mood is stable, no new changes  All other systems were reviewed with the patient and are negative.  PHYSICAL EXAMINATION: ECOG PERFORMANCE STATUS: 3 - Symptomatic,  >50% confined to bed  Vitals:   01/28/19 0820 01/28/19 1210  BP: (!) 148/85 112/83  Pulse: (!) 59 82  Resp: 18 16  Temp: 98.7 F (37.1 C) 97.8 F (36.6 C)  SpO2: 95% 90%   Filed Weights   01/26/19 0500  Weight: 99 lb 6.8 oz (45.1 kg)    GENERAL:alert, no distress and comfortable SKIN: skin color, texture, turgor are normal, no rashes, petechia or ecchymosis EYES: normal, conjunctiva are pink and non-injected, sclera clear OROPHARYNX:no exudate, no erythema and lips, buccal mucosa, and tongue normal  NECK: supple, thyroid normal size, non-tender, without nodularity LYMPH:  no palpable  lymphadenopathy in the cervical, axillary or inguinal LUNGS: clear to auscultation and percussion with normal breathing effort HEART: regular rate & rhythm and no murmurs and no lower extremity edema ABDOMEN:abdomen soft, non-tender and normal bowel sounds EXT: No peripheral edema  LABORATORY DATA:  I have reviewed the data as listed Lab Results  Component Value Date   WBC 9.1 01/28/2019   HGB 15.5 (H) 01/28/2019   HCT 49.5 (H) 01/28/2019   MCV 86.4 01/28/2019   PLT 928 (HH) 01/28/2019   Recent Labs    01/25/19 2245 01/25/19 2254 01/28/19 0441  NA 139  --  140  K 4.1  --  3.7  CL 102  --  109  CO2 26  --  24  GLUCOSE 106*  --  98  BUN 19  --  23  CREATININE 0.96 0.90 1.20*  CALCIUM 9.8  --  8.9  GFRNONAA 52*  --  40*  GFRAA >60  --  46*  PROT 7.5  --   --   ALBUMIN 4.2  --   --   AST 32  --   --   ALT 15  --   --   ALKPHOS 59  --   --   BILITOT 0.8  --   --     RADIOGRAPHIC STUDIES: I have personally reviewed the radiological images as listed and agreed with the findings in the report. Ct Angio Head W Or Wo Contrast  Result Date: 01/26/2019 CLINICAL DATA:  Stroke EXAM: CT ANGIOGRAPHY HEAD AND NECK TECHNIQUE: Multidetector CT imaging of the head and neck was performed using the standard protocol during bolus administration of intravenous contrast. Multiplanar CT image  reconstructions and MIPs were obtained to evaluate the vascular anatomy. Carotid stenosis measurements (when applicable) are obtained utilizing NASCET criteria, using the distal internal carotid diameter as the denominator. CONTRAST:  61m OMNIPAQUE IOHEXOL 350 MG/ML SOLN COMPARISON:  MRI head 01/26/2019.  CT head 01/25/2019 FINDINGS: CTA NECK FINDINGS Aortic arch: Mild atherosclerotic disease aortic arch without aneurysm or dissection. Proximal great vessels widely patent Right carotid system: Widely patent without atherosclerotic disease or dissection Left carotid system: Widely patent without atherosclerotic disease or dissection Vertebral arteries: Both vertebral arteries widely patent to the basilar Skeleton: Mild cervical spine degenerative change. No acute skeletal abnormality. Other neck: Negative Upper chest: Lung apices clear bilaterally. Review of the MIP images confirms the above findings CTA HEAD FINDINGS Anterior circulation: No significant stenosis, proximal occlusion, aneurysm, or vascular malformation. Posterior circulation: No significant stenosis, proximal occlusion, aneurysm, or vascular malformation. Venous sinuses: Not well evaluated due to arterial phase scanning Anatomic variants: None Delayed phase: No acute intracranial abnormality. Extensive chronic microvascular ischemia. Review of the MIP images confirms the above findings IMPRESSION: 1. Negative for large vessel occlusion. Acute infarcts on the right are consistent with small vessel occlusion. Extensive chronic microvascular ischemic change in the brain 2. No significant carotid or vertebral atherosclerotic disease in the neck. Electronically Signed   By: CFranchot GalloM.D.   On: 01/26/2019 13:07   Dg Chest 2 View  Result Date: 01/26/2019 CLINICAL DATA:  Altered mental status. EXAM: CHEST - 2 VIEW COMPARISON:  Chest radiograph 06/19/2017. CT 06/23/2017 FINDINGS: The patient is rotated. Heart size normal for technique. Aortic  tortuosity. No pulmonary edema. No focal airspace disease. No pleural effusion or pneumothorax. Bones are under mineralized with scoliosis. IMPRESSION: Rotated exam without acute abnormality. Electronically Signed   By: MKeith RakeM.D.   On: 01/26/2019 00:14  Ct Angio Neck W Or Wo Contrast  Result Date: 01/26/2019 CLINICAL DATA:  Stroke EXAM: CT ANGIOGRAPHY HEAD AND NECK TECHNIQUE: Multidetector CT imaging of the head and neck was performed using the standard protocol during bolus administration of intravenous contrast. Multiplanar CT image reconstructions and MIPs were obtained to evaluate the vascular anatomy. Carotid stenosis measurements (when applicable) are obtained utilizing NASCET criteria, using the distal internal carotid diameter as the denominator. CONTRAST:  89m OMNIPAQUE IOHEXOL 350 MG/ML SOLN COMPARISON:  MRI head 01/26/2019.  CT head 01/25/2019 FINDINGS: CTA NECK FINDINGS Aortic arch: Mild atherosclerotic disease aortic arch without aneurysm or dissection. Proximal great vessels widely patent Right carotid system: Widely patent without atherosclerotic disease or dissection Left carotid system: Widely patent without atherosclerotic disease or dissection Vertebral arteries: Both vertebral arteries widely patent to the basilar Skeleton: Mild cervical spine degenerative change. No acute skeletal abnormality. Other neck: Negative Upper chest: Lung apices clear bilaterally. Review of the MIP images confirms the above findings CTA HEAD FINDINGS Anterior circulation: No significant stenosis, proximal occlusion, aneurysm, or vascular malformation. Posterior circulation: No significant stenosis, proximal occlusion, aneurysm, or vascular malformation. Venous sinuses: Not well evaluated due to arterial phase scanning Anatomic variants: None Delayed phase: No acute intracranial abnormality. Extensive chronic microvascular ischemia. Review of the MIP images confirms the above findings IMPRESSION: 1.  Negative for large vessel occlusion. Acute infarcts on the right are consistent with small vessel occlusion. Extensive chronic microvascular ischemic change in the brain 2. No significant carotid or vertebral atherosclerotic disease in the neck. Electronically Signed   By: CFranchot GalloM.D.   On: 01/26/2019 13:07   Mr Brain Wo Contrast  Result Date: 01/26/2019 CLINICAL DATA:  Dysarthria.  History of stroke. EXAM: MRI HEAD WITHOUT CONTRAST TECHNIQUE: Multiplanar, multiecho pulse sequences of the brain and surrounding structures were obtained without intravenous contrast. COMPARISON:  CT head 01/25/2019.  MRI head 03/09/2014 FINDINGS: Brain: Acute infarct in the right basal ganglia involving the posterior limb internal capsule and caudate body. Small area of acute infarct in the right parietal cortex. Mild atrophy. Extensive chronic ischemic changes throughout the white matter. Chronic infarcts in the thalamus bilaterally. Small chronic infarcts in the cerebellum bilaterally. Chronic infarct in the left midbrain. Negative for hemorrhage mass or midline shift. Vascular: Normal arterial flow voids Skull and upper cervical spine: Negative Sinuses/Orbits: Mild mucosal edema paranasal sinuses. Bilateral cataract surgery Other: None IMPRESSION: Acute infarct in the posterior limb internal capsule on the right extending into the caudate body. Small area of acute infarct right parietal cortex Advanced chronic microvascular ischemia. Electronically Signed   By: CFranchot GalloM.D.   On: 01/26/2019 07:10   Dg Chest Port 1 View  Result Date: 01/27/2019 CLINICAL DATA:  Shortness of breath. EXAM: PORTABLE CHEST 1 VIEW COMPARISON:  Frontal and lateral views yesterday. FINDINGS: The cardiomediastinal contours are normal for AP technique. The lungs are clear. Pulmonary vasculature is normal. No consolidation, pleural effusion, or pneumothorax. No acute osseous abnormalities are seen. Unchanged cortical thickening in the  left mid humerus likely at the deltoid insertion. Scoliotic curvature in the spine. IMPRESSION: No acute chest findings. Electronically Signed   By: MKeith RakeM.D.   On: 01/27/2019 21:34   Dg Swallowing Func-speech Pathology  Result Date: 01/27/2019 Objective Swallowing Evaluation: Type of Study: MBS-Modified Barium Swallow Study  Patient Details Name: AExa BombaMRN: [id number] of Birth: 3Jan 14, 1929Today's Date: 01/27/2019 Time: SLP Start Time (ACUTE ONLY): 1211 -SLP Stop Time (ACUTE ONLY): 14098SLP  Time Calculation (min) (ACUTE ONLY): 23 min Past Medical History: Past Medical History: Diagnosis Date  Cataract   Complication of anesthesia   Difficulty waking up "  Coronary artery disease   Hip fracture ([place]) 06/2017  right hip  Hypertension   Osteoporosis   Stroke [facility])  Past Surgical History: Past Surgical History: Procedure Laterality Date  ANTERIOR APPROACH HEMI HIP ARTHROPLASTY Right 06/20/2017  Procedure: ANTERIOR APPROACH HEMI HIP ARTHROPLASTY;  Surgeon: Rod Can, MD;  Location: [place];  Service: Orthopedics;  Laterality: Right;  FRACTURE SURGERY    SHOULDER HARDWARE REMOVAL Left   SHOULDER HEMI-ARTHROPLASTY Left   TOTAL HIP ARTHROPLASTY Right  HPI: Pt is an 85 year old female who presents with slurred speech and difficulty swallowing. MRI showed  a new posterior limb of the internal capsule stroke on the right as well as a small area of cortical stroke in the right frontal cortex. PMH includes: CAD, HTN, CVA (with expressive aphasia that improved almost back to baseline per granddaughter, no other SLP needs at that time)  Subjective: alert, soft speech, also slurred per family that is interpreting Assessment / Plan / Recommendation CHL IP CLINICAL IMPRESSIONS 01/26/2019 Clinical Impression Pt has a moderate oral and mild pharyngeal dysphagia with generalized fatigue that also likely contributes to aspiration risk. Her granddaughter was present for education as well as  translation. She will only take very small bites/sips, resisting SLP if attempting to provide larger boluses, and she could not suck thickened liquid up via straw. She has weak lingual manipulation that results in poor bolus cohesion, incomplete posterior transit that leaves oral residue, and premature spillage that reaches the pyriform sinuses before the swallow. Premature spillage with thin liquids spills directly into the airway before the swallow. Pt has a cough reaction (1-2 seconds delayed) but this does not move aspirated material. Nectar thick liquids and purees are better contained above the valleculae and do not enter the airway. Recommend starting Dys 1 diet and nectar thick liquids by cup or spoon. Suspect that pt will need smaller, more frequent offerings of food/liquid so that she does not fatigue across a meal, as she appeared to fatigue even during this study. Fatigue could further increase her risk of aspiration. Diet and precautions were reviewed with handouts provided. Will continue to follow acutely. SLP Visit Diagnosis Dysphagia, oropharyngeal phase (R13.12) Attention and concentration deficit following -- Frontal lobe and executive function deficit following -- Impact on safety and function Mild aspiration risk;Moderate aspiration risk;Risk for inadequate nutrition/hydration   CHL IP TREATMENT RECOMMENDATION 01/26/2019 Treatment Recommendations Therapy as outlined in treatment plan below   Prognosis 01/26/2019 Prognosis for Safe Diet Advancement Good Barriers to Reach Goals -- Barriers/Prognosis Comment -- CHL IP DIET RECOMMENDATION 01/26/2019 SLP Diet Recommendations Dysphagia 1 (Puree) solids;Nectar thick liquid Liquid Administration via Cup;Spoon;No straw Medication Administration Crushed with puree Compensations Slow rate;Small sips/bites Postural Changes Seated upright at 90 degrees   CHL IP OTHER RECOMMENDATIONS 01/26/2019 Recommended Consults -- Oral Care Recommendations Oral care BID Other  Recommendations Order thickener from pharmacy;Prohibited food (jello, ice cream, thin soups);Remove water pitcher   CHL IP FOLLOW UP RECOMMENDATIONS 01/26/2019 Follow up Recommendations Home health SLP;24 hour supervision/assistance   CHL IP FREQUENCY AND DURATION 01/26/2019 Speech Therapy Frequency (ACUTE ONLY) min 2x/week Treatment Duration 2 weeks      CHL IP ORAL PHASE 01/26/2019 Oral Phase Impaired Oral - Pudding Teaspoon -- Oral - Pudding Cup -- Oral - Honey Teaspoon -- Oral - Honey Cup -- Oral - Nectar Teaspoon  Weak lingual manipulation;Reduced posterior propulsion;Lingual/palatal residue;Delayed oral transit;Decreased bolus cohesion;Premature spillage Oral - Nectar Cup Weak lingual manipulation;Reduced posterior propulsion;Lingual/palatal residue;Delayed oral transit;Decreased bolus cohesion;Premature spillage Oral - Nectar Straw -- Oral - Thin Teaspoon Weak lingual manipulation;Reduced posterior propulsion;Lingual/palatal residue;Delayed oral transit;Decreased bolus cohesion;Premature spillage Oral - Thin Cup -- Oral - Thin Straw -- Oral - Puree Weak lingual manipulation;Reduced posterior propulsion;Lingual/palatal residue;Delayed oral transit;Decreased bolus cohesion;Premature spillage Oral - Mech Soft -- Oral - Regular -- Oral - Multi-Consistency -- Oral - Pill -- Oral Phase - Comment --  CHL IP PHARYNGEAL PHASE 01/26/2019 Pharyngeal Phase Impaired Pharyngeal- Pudding Teaspoon -- Pharyngeal -- Pharyngeal- Pudding Cup -- Pharyngeal -- Pharyngeal- Honey Teaspoon -- Pharyngeal -- Pharyngeal- Honey Cup -- Pharyngeal -- Pharyngeal- Nectar Teaspoon Reduced tongue base retraction Pharyngeal -- Pharyngeal- Nectar Cup Reduced tongue base retraction Pharyngeal -- Pharyngeal- Nectar Straw -- Pharyngeal -- Pharyngeal- Thin Teaspoon Reduced tongue base retraction;Penetration/Aspiration before swallow Pharyngeal Material enters airway, passes BELOW cords and not ejected out despite cough attempt by patient Pharyngeal- Thin  Cup -- Pharyngeal -- Pharyngeal- Thin Straw -- Pharyngeal -- Pharyngeal- Puree Reduced tongue base retraction;Pharyngeal residue - valleculae Pharyngeal -- Pharyngeal- Mechanical Soft -- Pharyngeal -- Pharyngeal- Regular -- Pharyngeal -- Pharyngeal- Multi-consistency -- Pharyngeal -- Pharyngeal- Pill -- Pharyngeal -- Pharyngeal Comment --  CHL IP CERVICAL ESOPHAGEAL PHASE 01/26/2019 Cervical Esophageal Phase WFL Pudding Teaspoon -- Pudding Cup -- Honey Teaspoon -- Honey Cup -- Nectar Teaspoon -- Nectar Cup -- Nectar Straw -- Thin Teaspoon -- Thin Cup -- Thin Straw -- Puree -- Mechanical Soft -- Regular -- Multi-consistency -- Pill -- Cervical Esophageal Comment -- Dannial Monarch 01/27/2019, 11:08 AM              Ct Head Code Stroke Wo Contrast  Result Date: 01/25/2019 CLINICAL DATA:  Code stroke. 85 year old female with slurred speech and right side weakness. EXAM: CT HEAD WITHOUT CONTRAST TECHNIQUE: Contiguous axial images were obtained from the base of the skull through the vertex without intravenous contrast. COMPARISON:  Head CT 08/18/2014. Brain MRI 03/09/2014. FINDINGS: Brain: Cerebral volume is not significantly changed. No ventriculomegaly. No acute intracranial hemorrhage identified. No midline shift, mass effect, or evidence of intracranial mass lesion. 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 Small areas of cortical and subcortical white matter hypodensity/encephalomalacia in the posterior left MCA territory on series 2, image 20 and 18 appear increased but not brand new. There is a similar chronic appearing much smaller right parietal cortical infarct best seen on sagittal image 8. No other cortically based infarct identified. Vascular: Tortuous. Calcified atherosclerosis at the skull base. No suspicious intracranial vascular hyperdensity. Skull: No  acute osseous abnormality identified. Sinuses/Orbits: Visualized paranasal sinuses and mastoids are stable and well pneumatized. Other: Small nonspecific left forehead scalp soft tissue polypoid lesion on series 3, image 52 is new since 2015 but probably benign. Interval postoperative changes to both globes. ASPECTS [facility] Stroke Program Early CT Score) - Ganglionic level infarction (caudate, lentiform nuclei, internal capsule, insula, M1-M3 cortex): - Supraganglionic infarction (M4-M6 cortex): Total score (0-10 with 10 being normal): IMPRESSION: 1. Advanced chronic ischemic disease with progression in both cerebral and cerebellar hemispheres since 2015. Several bilateral areas are age indeterminate. 2. No acute intracranial hemorrhage or mass effect. 3.  No suspicious intracranial vascular hyperdensity. Study discussed by telephone with Dr. Virgel Manifold on 01/25/2019 at 22:40 . Electronically Signed  By: Genevie Ann M.D.   On: 01/25/2019 22:41   Vas [place] Lower Extremity Venous (dvt)  Result Date: 01/28/2019  Lower Venous Study Indications: Embolic stroke.  Performing Technologist: Antonieta Pert RDMS, RVT  Examination Guidelines: A complete evaluation includes B-mode imaging, spectral Doppler, color Doppler, and power Doppler as needed of all accessible portions of each vessel. Bilateral testing is considered an integral part of a complete examination. Limited examinations for reoccurring indications may be performed as noted.  Right Venous Findings: +---------+---------------+---------+-----------+----------+-------------------+            Compressibility Phasicity Spontaneity Properties Summary              +---------+---------------+---------+-----------+----------+-------------------+  CFV       Full            Yes       Yes                                         +---------+---------------+---------+-----------+----------+-------------------+  SFJ       Full                                                                   +---------+---------------+---------+-----------+----------+-------------------+  FV Prox   Full                                                                  +---------+---------------+---------+-----------+----------+-------------------+  FV Mid    Full                                                                  +---------+---------------+---------+-----------+----------+-------------------+  FV Distal Full                                                                  +---------+---------------+---------+-----------+----------+-------------------+  PFV       Full                                                                  +---------+---------------+---------+-----------+----------+-------------------+  POP       Full            Yes       Yes                                         +---------+---------------+---------+-----------+----------+-------------------+  PTV       Full                                                                  +---------+---------------+---------+-----------+----------+-------------------+  PERO                                                       not well visualized  +---------+---------------+---------+-----------+----------+-------------------+  GSV       Full                                                                  +---------+---------------+---------+-----------+----------+-------------------+  Left Venous Findings: +---------+---------------+---------+-----------+----------+-------+            Compressibility Phasicity Spontaneity Properties Summary  +---------+---------------+---------+-----------+----------+-------+  CFV       Full            Yes       Yes                             +---------+---------------+---------+-----------+----------+-------+  SFJ       Full                                                      +---------+---------------+---------+-----------+----------+-------+  FV Prox   Full                                                       +---------+---------------+---------+-----------+----------+-------+  FV Mid    Full                                                      +---------+---------------+---------+-----------+----------+-------+  FV Distal Full                                                      +---------+---------------+---------+-----------+----------+-------+  PFV       Full                                                      +---------+---------------+---------+-----------+----------+-------+  POP  Full            Yes       Yes                             +---------+---------------+---------+-----------+----------+-------+  PTV       Full                                                      +---------+---------------+---------+-----------+----------+-------+  PERO      Full                                                      +---------+---------------+---------+-----------+----------+-------+  GSV       Full                                                      +---------+---------------+---------+-----------+----------+-------+    Summary: Right: There is no evidence of deep vein thrombosis in the lower extremity. However, portions of this examination were limited- see technologist comments above. No cystic structure found in the popliteal fossa. Left: There is no evidence of deep vein thrombosis in the lower extremity. No cystic structure found in the popliteal fossa.  *See table(s) above for measurements and observations. Electronically signed by Deitra Mayo MD on 01/28/2019 at 6:45:31 AM.    Final     ASSESSMENT & PLAN: 85 year old [place] female, with past medical history of hypertension, 2 episodes of stroke, coronary artery disease, history of PE after hip replacement, presented with acute CVA, blood counts showed worsening thrombocytosis.  1.  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  Recurrent CVA 3. HTN  4. History of PE after hip replacement  5. CAD   Recommendations: -I  have reviewed her lab results, her B12 level is normal, folate level still pending, I will check iron study also, to rule out secondary/reactive thrombocytosis. -Given the chronic course of worsening thrombocytosis over 5 years, this is likely MPN, especially essential thrombocythemia.  I do not have high suspicion for malignancy related secondary thrombocytosis. -Will get JAK2 mutation test, if negative, will get MPN panel. This could be done as outpt.  Due to her advanced age, I do not plan to do a bone marrow biopsy. -we discussed the high risk of thrombosis from MPN, and I recommend her continue aspirin and Plavix. -repeated CBC today showed HCT 49.5%, I recommend half unit blood phlebotomy today or tomorrow  -When her swallow function gets improved, able to swallow a capsule, will start her on Hydrea 500 mg daily.  Patient does not have insurance, I will work with our oral pharmacist, to see if we can get financial assistance for her medicine -I will set up an outpt visit with me in 1-2 weeks -will f/u as needed.    All questions were answered. The patient knows to call the clinic with any problems, questions or concerns.      Truitt Merle, MD 01/28/2019 3:51 PM

## 2019-01-28 NOTE — Progress Notes (Signed)
20:00 on 01/27/19 Upon taking vital signs patient oxygenation noted to be in the 80's; pt placed on 2L [place]; but oxygenation level not rising; increased oxygenation rate to 5L via [place] to maintain oxygenation rate at 92.  RT called and lungs sounded clear, suggested possible CXray for PE (pt reports history of).  CXray negative for PE will continue to monitor.  5:46 received critical lab value on platelet count, 928; on call notified.

## 2019-01-28 NOTE — Progress Notes (Signed)
PROGRESS NOTE                                                                                                                                                                                                             Patient Demographics:    Jill Castaneda, is a 85 y.o. female, DOB - December 04, 1927, POE:[id number]  Admit date - 01/25/2019   Admitting Physician Vianne Bulls, MD  Outpatient Primary MD for the patient is Patient, No Pcp Per  LOS - 2   Chief Complaint  Patient presents with   Aphasia       Brief Narrative    85 y.o. female with medical history significant for coronary artery disease, multiple strokes with residual deficits, hypertension, and history of PE after a hip replacement, now presenting to emergency department for evaluation of generalized weakness and speech disturbance, her work-up significant for acute CVA.   Subjective:    Jill Castaneda today has, No headache, No chest pain, No abdominal pain - No Nausea, patient was hypoxic yesterday, required some oxygen supplements .   Assessment  & Plan :    Principal Problem:   Dysarthria Active Problems:   Hypertension   Thrombocytosis (HCC)   Coronary artery disease   History of stroke with residual deficit   CVA (cerebral vascular accident) ([place])  Acute CVA - MRI showing acute infarct in the posterior limb of internal capsule on the right extending into the caudate body, with small area of acute infarct right parietal cortex, patient main complaints is dysarthria, as well she has significant dysphagia. - She is followed by SLP, on dysphagia 1 with thickened liquid diet . - CTA head and neck with no evidence of large vessel occlusion - 2 D echo with a preserved EF, and no evidence of embolic sourceat - Consult with PT/OT/SLP  - Continue with aspirin and Plavix, to continue for 3-week with dual antiplatelet therapy, then Plavix alone - seen By PT, recommendation for  CIR -Hemoglobin A1c at 5.8, DL is 154  polycythemia/thrombocytosis -Discussed with the granddaughter, patient usually following with [facility] clinic, her platelet count usually between 800 K to 1000 K, elevated hemoglobin. -Concerning for essential thrombocythemia can be related to recurrent stroke, hematology has been consulted  Hyperipidemia -LDL is 154, currently on statin  Hypertension  - DBP in low 100's in  ED  - Permissive HTN to 220/110 while evaluating for possible acute CVA    CAD  - No anginal complaints  - Continue ASA   AKI -Continue is 1.2 today, will start on IV fluids, she is likely dehydrated, especially on nectar thickened diet, as well she received IV contrast recently  Acute hypoxic respiratory failure -Patient is on 4 L nasal cannula this morning, I was able to wean to 2 L currently, and daughter report mild coughing while being fed yesterday, possibly related to mild aspiration, versus atelectasis, encouraged use incentive spirometry, no acute finding on physical exam or chest x-ray      Code Status : DNR  Family Communication  : grandaughter at bedside  Disposition Plan  : CIR  Consults  :  neurolgoy  Procedures  : None  DVT Prophylaxis  :  [place] lovenox  Lab Results  Component Value Date   PLT 928 (HH) 01/28/2019    Antibiotics  :    Anti-infectives (From admission, onward)   None        Objective:   Vitals:   01/28/19 0008 01/28/19 0436 01/28/19 0437 01/28/19 0820  BP: (!) 152/89 139/83 139/83 (!) 148/85  Pulse: 63 (!) 59 (!) 57 (!) 59  Resp: 16  16 18   Temp: 98 F (36.7 C) 97.7 F (36.5 C) 97.7 F (36.5 C) 98.7 F (37.1 C)  TempSrc: Oral Oral Axillary Oral  SpO2: 95%  94% 95%  Weight:      Height:        Wt Readings from Last 3 Encounters:  01/26/19 45.1 kg  06/23/17 47.2 kg  06/19/17 42.2 kg     Intake/Output Summary (Last 24 hours) at 01/28/2019 1008 Last data filed at 01/27/2019 1301 Gross per 24 hour  Intake 240  ml  Output --  Net 240 ml     Physical Exam  Awake Alert, is Russian-speaking, granddaughter at bedside assist with translation and patient care symmetrical Chest wall movement, Good air movement bilaterally, CTAB RRR,No Gallops,Rubs or new Murmurs, No Parasternal Heave +ve B.Sounds, Abd Soft, No tenderness, No rebound - guarding or rigidity. No Cyanosis, Clubbing or edema, No new Rash or bruise   Delayed skin turgor     Data Review:    CBC Recent Labs  Lab 01/25/19 2245 01/28/19 0441  WBC 8.5 9.1  HGB 16.7* 15.5*  HCT 53.6* 49.5*  PLT 1,024* 928*  MCV 87.9 86.4  MCH 27.4 27.1  MCHC 31.2 31.3  RDW 16.5* 16.3*  LYMPHSABS 1.2  --   MONOABS 0.6  --   EOSABS 0.2  --   BASOSABS 0.1  --     Chemistries  Recent Labs  Lab 01/25/19 2245 01/25/19 2254 01/28/19 0441  NA 139  --  140  K 4.1  --  3.7  CL 102  --  109  CO2 26  --  24  GLUCOSE 106*  --  98  BUN 19  --  23  CREATININE 0.96 0.90 1.20*  CALCIUM 9.8  --  8.9  AST 32  --   --   ALT 15  --   --   ALKPHOS 59  --   --   BILITOT 0.8  --   --    ------------------------------------------------------------------------------------------------------------------ Recent Labs    01/25/19 2255  CHOL 234*  HDL 62  LDLCALC 154*  TRIG 88  CHOLHDL 3.8    Lab Results  Component Value Date   HGBA1C 5.8 (H)  01/25/2019   ------------------------------------------------------------------------------------------------------------------ Recent Labs    01/26/19 0712  TSH 7.553*   ------------------------------------------------------------------------------------------------------------------ Recent Labs    01/26/19 0712  VITAMINB12 359    Coagulation profile Recent Labs  Lab 01/25/19 2245  INR 0.9    No results for input(s): DDIMER in the last 72 hours.  Cardiac Enzymes No results for input(s): CKMB, TROPONINI, MYOGLOBIN in the last 168 hours.  Invalid input(s):  CK ------------------------------------------------------------------------------------------------------------------    Component Value Date/Time   BNP 144.1 (H) 06/23/2017 1741    Inpatient Medications  Scheduled Meds:  aspirin EC  81 mg Oral Daily   atorvastatin  40 mg Oral q1800   clopidogrel  75 mg Oral Daily   enoxaparin (LOVENOX) injection  30 mg Subcutaneous Q24H   Continuous Infusions:  sodium chloride 50 mL/hr at 01/28/19 0955   PRN Meds:.acetaminophen **OR** acetaminophen (TYLENOL) oral liquid 160 mg/5 mL **OR** acetaminophen, labetalol, Resource ThickenUp Clear, senna-docusate  Micro Results No results found for this or any previous visit (from the past 240 hour(s)).  Radiology Reports Ct Angio Head W Or Wo Contrast  Result Date: 01/26/2019 CLINICAL DATA:  Stroke EXAM: CT ANGIOGRAPHY HEAD AND NECK TECHNIQUE: Multidetector CT imaging of the head and neck was performed using the standard protocol during bolus administration of intravenous contrast. Multiplanar CT image reconstructions and MIPs were obtained to evaluate the vascular anatomy. Carotid stenosis measurements (when applicable) are obtained utilizing NASCET criteria, using the distal internal carotid diameter as the denominator. CONTRAST:  43mL OMNIPAQUE IOHEXOL 350 MG/ML SOLN COMPARISON:  MRI head 01/26/2019.  CT head 01/25/2019 FINDINGS: CTA NECK FINDINGS Aortic arch: Mild atherosclerotic disease aortic arch without aneurysm or dissection. Proximal great vessels widely patent Right carotid system: Widely patent without atherosclerotic disease or dissection Left carotid system: Widely patent without atherosclerotic disease or dissection Vertebral arteries: Both vertebral arteries widely patent to the basilar Skeleton: Mild cervical spine degenerative change. No acute skeletal abnormality. Other neck: Negative Upper chest: Lung apices clear bilaterally. Review of the MIP images confirms the above findings CTA HEAD  FINDINGS Anterior circulation: No significant stenosis, proximal occlusion, aneurysm, or vascular malformation. Posterior circulation: No significant stenosis, proximal occlusion, aneurysm, or vascular malformation. Venous sinuses: Not well evaluated due to arterial phase scanning Anatomic variants: None Delayed phase: No acute intracranial abnormality. Extensive chronic microvascular ischemia. Review of the MIP images confirms the above findings IMPRESSION: 1. Negative for large vessel occlusion. Acute infarcts on the right are consistent with small vessel occlusion. Extensive chronic microvascular ischemic change in the brain 2. No significant carotid or vertebral atherosclerotic disease in the neck. Electronically Signed   By: Franchot Gallo M.D.   On: 01/26/2019 13:07   Dg Chest 2 View  Result Date: 01/26/2019 CLINICAL DATA:  Altered mental status. EXAM: CHEST - 2 VIEW COMPARISON:  Chest radiograph 06/19/2017. CT 06/23/2017 FINDINGS: The patient is rotated. Heart size normal for technique. Aortic tortuosity. No pulmonary edema. No focal airspace disease. No pleural effusion or pneumothorax. Bones are under mineralized with scoliosis. IMPRESSION: Rotated exam without acute abnormality. Electronically Signed   By: Keith Rake M.D.   On: 01/26/2019 00:14   Ct Angio Neck W Or Wo Contrast  Result Date: 01/26/2019 CLINICAL DATA:  Stroke EXAM: CT ANGIOGRAPHY HEAD AND NECK TECHNIQUE: Multidetector CT imaging of the head and neck was performed using the standard protocol during bolus administration of intravenous contrast. Multiplanar CT image reconstructions and MIPs were obtained to evaluate the vascular anatomy. Carotid stenosis measurements (when applicable) are  obtained utilizing NASCET criteria, using the distal internal carotid diameter as the denominator. CONTRAST:  59mL OMNIPAQUE IOHEXOL 350 MG/ML SOLN COMPARISON:  MRI head 01/26/2019.  CT head 01/25/2019 FINDINGS: CTA NECK FINDINGS Aortic arch:  Mild atherosclerotic disease aortic arch without aneurysm or dissection. Proximal great vessels widely patent Right carotid system: Widely patent without atherosclerotic disease or dissection Left carotid system: Widely patent without atherosclerotic disease or dissection Vertebral arteries: Both vertebral arteries widely patent to the basilar Skeleton: Mild cervical spine degenerative change. No acute skeletal abnormality. Other neck: Negative Upper chest: Lung apices clear bilaterally. Review of the MIP images confirms the above findings CTA HEAD FINDINGS Anterior circulation: No significant stenosis, proximal occlusion, aneurysm, or vascular malformation. Posterior circulation: No significant stenosis, proximal occlusion, aneurysm, or vascular malformation. Venous sinuses: Not well evaluated due to arterial phase scanning Anatomic variants: None Delayed phase: No acute intracranial abnormality. Extensive chronic microvascular ischemia. Review of the MIP images confirms the above findings IMPRESSION: 1. Negative for large vessel occlusion. Acute infarcts on the right are consistent with small vessel occlusion. Extensive chronic microvascular ischemic change in the brain 2. No significant carotid or vertebral atherosclerotic disease in the neck. Electronically Signed   By: Franchot Gallo M.D.   On: 01/26/2019 13:07   Mr Brain Wo Contrast  Result Date: 01/26/2019 CLINICAL DATA:  Dysarthria.  History of stroke. EXAM: MRI HEAD WITHOUT CONTRAST TECHNIQUE: Multiplanar, multiecho pulse sequences of the brain and surrounding structures were obtained without intravenous contrast. COMPARISON:  CT head 01/25/2019.  MRI head 03/09/2014 FINDINGS: Brain: Acute infarct in the right basal ganglia involving the posterior limb internal capsule and caudate body. Small area of acute infarct in the right parietal cortex. Mild atrophy. Extensive chronic ischemic changes throughout the white matter. Chronic infarcts in the thalamus  bilaterally. Small chronic infarcts in the cerebellum bilaterally. Chronic infarct in the left midbrain. Negative for hemorrhage mass or midline shift. Vascular: Normal arterial flow voids Skull and upper cervical spine: Negative Sinuses/Orbits: Mild mucosal edema paranasal sinuses. Bilateral cataract surgery Other: None IMPRESSION: Acute infarct in the posterior limb internal capsule on the right extending into the caudate body. Small area of acute infarct right parietal cortex Advanced chronic microvascular ischemia. Electronically Signed   By: Franchot Gallo M.D.   On: 01/26/2019 07:10   Dg Chest Port 1 View  Result Date: 01/27/2019 CLINICAL DATA:  Shortness of breath. EXAM: PORTABLE CHEST 1 VIEW COMPARISON:  Frontal and lateral views yesterday. FINDINGS: The cardiomediastinal contours are normal for AP technique. The lungs are clear. Pulmonary vasculature is normal. No consolidation, pleural effusion, or pneumothorax. No acute osseous abnormalities are seen. Unchanged cortical thickening in the left mid humerus likely at the deltoid insertion. Scoliotic curvature in the spine. IMPRESSION: No acute chest findings. Electronically Signed   By: Keith Rake M.D.   On: 01/27/2019 21:34   Dg Swallowing Func-speech Pathology  Result Date: 01/27/2019 Objective Swallowing Evaluation: Type of Study: MBS-Modified Barium Swallow Study  Patient Details Name: Jill Castaneda MRN: [id number] Date of Birth: [birth year] Today's Date: 01/27/2019 Time: SLP Start Time (ACUTE ONLY): 1211 -SLP Stop Time (ACUTE ONLY): 1234 SLP Time Calculation (min) (ACUTE ONLY): 23 min Past Medical History: Past Medical History: Diagnosis Date  Cataract   Complication of anesthesia   Difficulty waking up "  Coronary artery disease   Hip fracture ([place]) 06/2017  right hip  Hypertension   Osteoporosis   Stroke [facility])  Past Surgical History: Past Surgical History: Procedure Laterality Date  ANTERIOR APPROACH HEMI HIP ARTHROPLASTY Right  06/20/2017  Procedure: ANTERIOR APPROACH HEMI HIP ARTHROPLASTY;  Surgeon: Rod Can, MD;  Location: [place];  Service: Orthopedics;  Laterality: Right;  FRACTURE SURGERY    SHOULDER HARDWARE REMOVAL Left   SHOULDER HEMI-ARTHROPLASTY Left   TOTAL HIP ARTHROPLASTY Right  HPI: Pt is an 85 year old female who presents with slurred speech and difficulty swallowing. MRI showed  a new posterior limb of the internal capsule stroke on the right as well as a small area of cortical stroke in the right frontal cortex. PMH includes: CAD, HTN, CVA (with expressive aphasia that improved almost back to baseline per granddaughter, no other SLP needs at that time)  Subjective: alert, soft speech, also slurred per family that is interpreting Assessment / Plan / Recommendation CHL IP CLINICAL IMPRESSIONS 01/26/2019 Clinical Impression Pt has a moderate oral and mild pharyngeal dysphagia with generalized fatigue that also likely contributes to aspiration risk. Her granddaughter was present for education as well as translation. She will only take very small bites/sips, resisting SLP if attempting to provide larger boluses, and she could not suck thickened liquid up via straw. She has weak lingual manipulation that results in poor bolus cohesion, incomplete posterior transit that leaves oral residue, and premature spillage that reaches the pyriform sinuses before the swallow. Premature spillage with thin liquids spills directly into the airway before the swallow. Pt has a cough reaction (1-2 seconds delayed) but this does not move aspirated material. Nectar thick liquids and purees are better contained above the valleculae and do not enter the airway. Recommend starting Dys 1 diet and nectar thick liquids by cup or spoon. Suspect that pt will need smaller, more frequent offerings of food/liquid so that she does not fatigue across a meal, as she appeared to fatigue even during this study. Fatigue could further increase her risk of  aspiration. Diet and precautions were reviewed with handouts provided. Will continue to follow acutely. SLP Visit Diagnosis Dysphagia, oropharyngeal phase (R13.12) Attention and concentration deficit following -- Frontal lobe and executive function deficit following -- Impact on safety and function Mild aspiration risk;Moderate aspiration risk;Risk for inadequate nutrition/hydration   CHL IP TREATMENT RECOMMENDATION 01/26/2019 Treatment Recommendations Therapy as outlined in treatment plan below   Prognosis 01/26/2019 Prognosis for Safe Diet Advancement Good Barriers to Reach Goals -- Barriers/Prognosis Comment -- CHL IP DIET RECOMMENDATION 01/26/2019 SLP Diet Recommendations Dysphagia 1 (Puree) solids;Nectar thick liquid Liquid Administration via Cup;Spoon;No straw Medication Administration Crushed with puree Compensations Slow rate;Small sips/bites Postural Changes Seated upright at 90 degrees   CHL IP OTHER RECOMMENDATIONS 01/26/2019 Recommended Consults -- Oral Care Recommendations Oral care BID Other Recommendations Order thickener from pharmacy;Prohibited food (jello, ice cream, thin soups);Remove water pitcher   CHL IP FOLLOW UP RECOMMENDATIONS 01/26/2019 Follow up Recommendations Home health SLP;24 hour supervision/assistance   CHL IP FREQUENCY AND DURATION 01/26/2019 Speech Therapy Frequency (ACUTE ONLY) min 2x/week Treatment Duration 2 weeks      CHL IP ORAL PHASE 01/26/2019 Oral Phase Impaired Oral - Pudding Teaspoon -- Oral - Pudding Cup -- Oral - Honey Teaspoon -- Oral - Honey Cup -- Oral - Nectar Teaspoon Weak lingual manipulation;Reduced posterior propulsion;Lingual/palatal residue;Delayed oral transit;Decreased bolus cohesion;Premature spillage Oral - Nectar Cup Weak lingual manipulation;Reduced posterior propulsion;Lingual/palatal residue;Delayed oral transit;Decreased bolus cohesion;Premature spillage Oral - Nectar Straw -- Oral - Thin Teaspoon Weak lingual manipulation;Reduced posterior  propulsion;Lingual/palatal residue;Delayed oral transit;Decreased bolus cohesion;Premature spillage Oral - Thin Cup -- Oral - Thin Straw -- Oral - Puree Weak  lingual manipulation;Reduced posterior propulsion;Lingual/palatal residue;Delayed oral transit;Decreased bolus cohesion;Premature spillage Oral - Mech Soft -- Oral - Regular -- Oral - Multi-Consistency -- Oral - Pill -- Oral Phase - Comment --  CHL IP PHARYNGEAL PHASE 01/26/2019 Pharyngeal Phase Impaired Pharyngeal- Pudding Teaspoon -- Pharyngeal -- Pharyngeal- Pudding Cup -- Pharyngeal -- Pharyngeal- Honey Teaspoon -- Pharyngeal -- Pharyngeal- Honey Cup -- Pharyngeal -- Pharyngeal- Nectar Teaspoon Reduced tongue base retraction Pharyngeal -- Pharyngeal- Nectar Cup Reduced tongue base retraction Pharyngeal -- Pharyngeal- Nectar Straw -- Pharyngeal -- Pharyngeal- Thin Teaspoon Reduced tongue base retraction;Penetration/Aspiration before swallow Pharyngeal Material enters airway, passes BELOW cords and not ejected out despite cough attempt by patient Pharyngeal- Thin Cup -- Pharyngeal -- Pharyngeal- Thin Straw -- Pharyngeal -- Pharyngeal- Puree Reduced tongue base retraction;Pharyngeal residue - valleculae Pharyngeal -- Pharyngeal- Mechanical Soft -- Pharyngeal -- Pharyngeal- Regular -- Pharyngeal -- Pharyngeal- Multi-consistency -- Pharyngeal -- Pharyngeal- Pill -- Pharyngeal -- Pharyngeal Comment --  CHL IP CERVICAL ESOPHAGEAL PHASE 01/26/2019 Cervical Esophageal Phase WFL Pudding Teaspoon -- Pudding Cup -- Honey Teaspoon -- Honey Cup -- Nectar Teaspoon -- Nectar Cup -- Nectar Straw -- Thin Teaspoon -- Thin Cup -- Thin Straw -- Puree -- Mechanical Soft -- Regular -- Multi-consistency -- Pill -- Cervical Esophageal Comment -- Dannial Monarch 01/27/2019, 11:08 AM              Ct Head Code Stroke Wo Contrast  Result Date: 01/25/2019 CLINICAL DATA:  Code stroke. 85 year old female with slurred speech and right side weakness. EXAM: CT HEAD WITHOUT CONTRAST  TECHNIQUE: Contiguous axial images were obtained from the base of the skull through the vertex without intravenous contrast. COMPARISON:  Head CT 08/18/2014. Brain MRI 03/09/2014. FINDINGS: Brain: Cerebral volume is not significantly changed. No ventriculomegaly. No acute intracranial hemorrhage identified. No midline shift, mass effect, or evidence of intracranial mass lesion. 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 Small areas of cortical and subcortical white matter hypodensity/encephalomalacia in the posterior left MCA territory on series 2, image 20 and 18 appear increased but not brand new. There is a similar chronic appearing much smaller right parietal cortical infarct best seen on sagittal image 8. No other cortically based infarct identified. Vascular: Tortuous. Calcified atherosclerosis at the skull base. No suspicious intracranial vascular hyperdensity. Skull: No acute osseous abnormality identified. Sinuses/Orbits: Visualized paranasal sinuses and mastoids are stable and well pneumatized. Other: Small nonspecific left forehead scalp soft tissue polypoid lesion on series 3, image 52 is new since 2015 but probably benign. Interval postoperative changes to both globes. ASPECTS [facility] Stroke Program Early CT Score) - Ganglionic level infarction (caudate, lentiform nuclei, internal capsule, insula, M1-M3 cortex): - Supraganglionic infarction (M4-M6 cortex): Total score (0-10 with 10 being normal): IMPRESSION: 1. Advanced chronic ischemic disease with progression in both cerebral and cerebellar hemispheres since 2015. Several bilateral areas are age indeterminate. 2. No acute intracranial hemorrhage or mass effect. 3.  No suspicious intracranial vascular hyperdensity. Study discussed by telephone with Dr. Virgel Manifold on 01/25/2019 at 22:40 . Electronically  Signed   By: Genevie Ann M.D.   On: 01/25/2019 22:41   Vas [place] Lower Extremity Venous (dvt)  Result Date: 01/28/2019  Lower Venous Study Indications: Embolic stroke.  Performing Technologist: Antonieta Pert RDMS, RVT  Examination Guidelines: A complete evaluation includes B-mode imaging, spectral Doppler, color Doppler, and power Doppler as needed of all accessible portions  of each vessel. Bilateral testing is considered an integral part of a complete examination. Limited examinations for reoccurring indications may be performed as noted.  Right Venous Findings: +---------+---------------+---------+-----------+----------+-------------------+            Compressibility Phasicity Spontaneity Properties Summary              +---------+---------------+---------+-----------+----------+-------------------+  CFV       Full            Yes       Yes                                         +---------+---------------+---------+-----------+----------+-------------------+  SFJ       Full                                                                  +---------+---------------+---------+-----------+----------+-------------------+  FV Prox   Full                                                                  +---------+---------------+---------+-----------+----------+-------------------+  FV Mid    Full                                                                  +---------+---------------+---------+-----------+----------+-------------------+  FV Distal Full                                                                  +---------+---------------+---------+-----------+----------+-------------------+  PFV       Full                                                                  +---------+---------------+---------+-----------+----------+-------------------+  POP       Full            Yes       Yes                                         +---------+---------------+---------+-----------+----------+-------------------+   PTV       Full                                                                  +---------+---------------+---------+-----------+----------+-------------------+  PERO                                                       not well visualized  +---------+---------------+---------+-----------+----------+-------------------+  GSV       Full                                                                  +---------+---------------+---------+-----------+----------+-------------------+  Left Venous Findings: +---------+---------------+---------+-----------+----------+-------+            Compressibility Phasicity Spontaneity Properties Summary  +---------+---------------+---------+-----------+----------+-------+  CFV       Full            Yes       Yes                             +---------+---------------+---------+-----------+----------+-------+  SFJ       Full                                                      +---------+---------------+---------+-----------+----------+-------+  FV Prox   Full                                                      +---------+---------------+---------+-----------+----------+-------+  FV Mid    Full                                                      +---------+---------------+---------+-----------+----------+-------+  FV Distal Full                                                      +---------+---------------+---------+-----------+----------+-------+  PFV       Full                                                      +---------+---------------+---------+-----------+----------+-------+  POP       Full            Yes       Yes                             +---------+---------------+---------+-----------+----------+-------+  PTV       Full                                                      +---------+---------------+---------+-----------+----------+-------+  PERO      Full                                                       +---------+---------------+---------+-----------+----------+-------+  GSV       Full                                                      +---------+---------------+---------+-----------+----------+-------+    Summary: Right: There is no evidence of deep vein thrombosis in the lower extremity. However, portions of this examination were limited- see technologist comments above. No cystic structure found in the popliteal fossa. Left: There is no evidence of deep vein thrombosis in the lower extremity. No cystic structure found in the popliteal fossa.  *See table(s) above for measurements and observations. Electronically signed by Deitra Mayo MD on 01/28/2019 at 53:45:31 AM.    Final      Phillips Climes M.D on 01/28/2019 at 10:08 AM  Between 7am to 7pm - Pager - [phone]  After 7pm go to [email or web address] - password [facility]  [facility] -  Office  [phone]

## 2019-01-29 ENCOUNTER — Inpatient Hospital Stay (HOSPITAL_COMMUNITY)
Admission: RE | Admit: 2019-01-29 | Discharge: 2019-02-09 | DRG: 057 | Disposition: A | Discharge status: Home / Self Care | Payer: Self-pay | Source: Intra-hospital | Attending: Physical Medicine & Rehabilitation | Admitting: Physical Medicine & Rehabilitation

## 2019-01-29 ENCOUNTER — Other Ambulatory Visit: Payer: Self-pay | Admitting: Physician Assistant

## 2019-01-29 ENCOUNTER — Encounter (HOSPITAL_COMMUNITY): Payer: Self-pay | Admitting: *Deleted

## 2019-01-29 ENCOUNTER — Other Ambulatory Visit: Payer: Self-pay

## 2019-01-29 DIAGNOSIS — I69354 Hemiplegia and hemiparesis following cerebral infarction affecting left non-dominant side: Principal | ICD-10-CM

## 2019-01-29 DIAGNOSIS — I639 Cerebral infarction, unspecified: Secondary | ICD-10-CM

## 2019-01-29 DIAGNOSIS — Z79899 Other long term (current) drug therapy: Secondary | ICD-10-CM

## 2019-01-29 DIAGNOSIS — R1310 Dysphagia, unspecified: Secondary | ICD-10-CM | POA: Diagnosis present

## 2019-01-29 DIAGNOSIS — Z823 Family history of stroke: Secondary | ICD-10-CM

## 2019-01-29 DIAGNOSIS — D62 Acute posthemorrhagic anemia: Secondary | ICD-10-CM | POA: Diagnosis present

## 2019-01-29 DIAGNOSIS — E785 Hyperlipidemia, unspecified: Secondary | ICD-10-CM | POA: Diagnosis present

## 2019-01-29 DIAGNOSIS — D751 Secondary polycythemia: Secondary | ICD-10-CM | POA: Diagnosis present

## 2019-01-29 DIAGNOSIS — I69391 Dysphagia following cerebral infarction: Secondary | ICD-10-CM

## 2019-01-29 DIAGNOSIS — Z8249 Family history of ischemic heart disease and other diseases of the circulatory system: Secondary | ICD-10-CM

## 2019-01-29 DIAGNOSIS — R2689 Other abnormalities of gait and mobility: Secondary | ICD-10-CM

## 2019-01-29 DIAGNOSIS — Z86711 Personal history of pulmonary embolism: Secondary | ICD-10-CM

## 2019-01-29 DIAGNOSIS — S301XXA Contusion of abdominal wall, initial encounter: Secondary | ICD-10-CM | POA: Diagnosis not present

## 2019-01-29 DIAGNOSIS — Z96641 Presence of right artificial hip joint: Secondary | ICD-10-CM | POA: Diagnosis present

## 2019-01-29 DIAGNOSIS — T45525A Adverse effect of antithrombotic drugs, initial encounter: Secondary | ICD-10-CM | POA: Diagnosis not present

## 2019-01-29 DIAGNOSIS — M810 Age-related osteoporosis without current pathological fracture: Secondary | ICD-10-CM | POA: Diagnosis present

## 2019-01-29 DIAGNOSIS — Z96612 Presence of left artificial shoulder joint: Secondary | ICD-10-CM | POA: Diagnosis present

## 2019-01-29 DIAGNOSIS — I2510 Atherosclerotic heart disease of native coronary artery without angina pectoris: Secondary | ICD-10-CM | POA: Diagnosis present

## 2019-01-29 DIAGNOSIS — C946 Myelodysplastic disease, not classified: Secondary | ICD-10-CM | POA: Diagnosis present

## 2019-01-29 DIAGNOSIS — D471 Chronic myeloproliferative disease: Secondary | ICD-10-CM

## 2019-01-29 DIAGNOSIS — I6381 Other cerebral infarction due to occlusion or stenosis of small artery: Secondary | ICD-10-CM | POA: Diagnosis present

## 2019-01-29 DIAGNOSIS — R339 Retention of urine, unspecified: Secondary | ICD-10-CM | POA: Diagnosis not present

## 2019-01-29 DIAGNOSIS — I69322 Dysarthria following cerebral infarction: Secondary | ICD-10-CM

## 2019-01-29 DIAGNOSIS — I10 Essential (primary) hypertension: Secondary | ICD-10-CM | POA: Diagnosis present

## 2019-01-29 DIAGNOSIS — D473 Essential (hemorrhagic) thrombocythemia: Secondary | ICD-10-CM

## 2019-01-29 DIAGNOSIS — K56609 Unspecified intestinal obstruction, unspecified as to partial versus complete obstruction: Secondary | ICD-10-CM

## 2019-01-29 DIAGNOSIS — Z7982 Long term (current) use of aspirin: Secondary | ICD-10-CM

## 2019-01-29 LAB — PATHOLOGIST SMEAR REVIEW

## 2019-01-29 LAB — BASIC METABOLIC PANEL
Anion gap: 5 (ref 5–15)
BUN: 24 mg/dL — ABNORMAL HIGH (ref 8–23)
CO2: 24 mmol/L (ref 22–32)
CREATININE: 1.05 mg/dL — AB (ref 0.44–1.00)
Calcium: 8.5 mg/dL — ABNORMAL LOW (ref 8.9–10.3)
Chloride: 111 mmol/L (ref 98–111)
GFR calc non Af Amer: 47 mL/min — ABNORMAL LOW (ref >60)
GFR, EST AFRICAN AMERICAN: 55 mL/min — AB (ref >60)
Glucose, Bld: 95 mg/dL (ref 70–99)
Potassium: 4 mmol/L (ref 3.5–5.1)
Sodium: 140 mmol/L (ref 135–145)

## 2019-01-29 LAB — CBC
HEMATOCRIT: 48.1 % — AB (ref 36.0–46.0)
Hemoglobin: 15 g/dL (ref 12.0–15.0)
MCH: 27.1 pg (ref 26.0–34.0)
MCHC: 31.2 g/dL (ref 30.0–36.0)
MCV: 86.8 fL (ref 80.0–100.0)
Platelets: 894 10*3/uL — ABNORMAL HIGH (ref 150–400)
RBC: 5.54 MIL/uL — ABNORMAL HIGH (ref 3.87–5.11)
RDW: 16.1 % — AB (ref 11.5–15.5)
WBC: 9.4 10*3/uL (ref 4.0–10.5)
nRBC: 0 % (ref 0.0–0.2)

## 2019-01-29 LAB — FOLATE RBC
Folate, Hemolysate: >620 ng/mL
Folate, RBC: >1230 ng/mL (ref >498)
Hematocrit: 50.4 % — ABNORMAL HIGH (ref 34.0–46.6)

## 2019-01-29 MED ORDER — ACETAMINOPHEN 160 MG/5ML PO SOLN: 650.0000 mg | ORAL | Status: DC | PRN

## 2019-01-29 MED ORDER — ACETAMINOPHEN 650 MG RE SUPP: 650.0000 mg | RECTAL | Status: DC | PRN

## 2019-01-29 MED ORDER — SORBITOL 70 % SOLN: 30.0000 mL | Freq: Every day | Status: DC | PRN

## 2019-01-29 MED ORDER — PANTOPRAZOLE SODIUM 40 MG PO TBEC: 40.0000 mg | DELAYED_RELEASE_TABLET | Freq: Every day | ORAL | Status: DC

## 2019-01-29 MED ORDER — ATORVASTATIN CALCIUM 40 MG PO TABS
40.0000 mg | ORAL_TABLET | Freq: Every day | ORAL | Status: DC
  Administered 2019-01-29 – 2019-02-08 (×11): 40 mg via ORAL
  Filled 2019-01-29 (×11): qty 1

## 2019-01-29 MED ORDER — ACETAMINOPHEN 325 MG PO TABS
650.0000 mg | ORAL_TABLET | ORAL | Status: DC | PRN
  Administered 2019-02-04: 650 mg via ORAL
  Administered 2019-02-04 – 2019-02-05 (×2): 325 mg via ORAL
  Administered 2019-02-07: 650 mg via ORAL
  Filled 2019-01-29 (×4): qty 2

## 2019-01-29 MED ORDER — ATORVASTATIN CALCIUM 40 MG PO TABS: 40.0000 mg | ORAL_TABLET | Freq: Every day | ORAL | Status: DC

## 2019-01-29 MED ORDER — ASPIRIN EC 81 MG PO TBEC
81.0000 mg | DELAYED_RELEASE_TABLET | Freq: Every day | ORAL | Status: DC
  Administered 2019-01-30 – 2019-02-05 (×7): 81 mg via ORAL
  Filled 2019-01-29 (×7): qty 1

## 2019-01-29 MED ORDER — ACETAMINOPHEN 325 MG PO TABS: 650.0000 mg | ORAL_TABLET | ORAL | Status: DC | PRN

## 2019-01-29 MED ORDER — CLOPIDOGREL BISULFATE 75 MG PO TABS: 75.0000 mg | ORAL_TABLET | Freq: Every day | ORAL | Status: DC

## 2019-01-29 MED ORDER — ORAL CARE MOUTH RINSE
15.0000 mL | Freq: Two times a day (BID) | OROMUCOSAL | Status: DC
  Administered 2019-01-29 – 2019-02-09 (×14): 15 mL via OROMUCOSAL

## 2019-01-29 MED ORDER — SENNOSIDES-DOCUSATE SODIUM 8.6-50 MG PO TABS: 1.0000 | ORAL_TABLET | Freq: Every evening | ORAL | Status: DC | PRN

## 2019-01-29 MED ORDER — ENOXAPARIN SODIUM 30 MG/0.3ML ~~LOC~~ SOLN: 30.0000 mg | SUBCUTANEOUS | Status: DC

## 2019-01-29 MED ORDER — PANTOPRAZOLE SODIUM 40 MG PO TBEC
40.0000 mg | DELAYED_RELEASE_TABLET | Freq: Every day | ORAL | Status: DC
  Filled 2019-01-29: qty 1

## 2019-01-29 MED ORDER — CLOPIDOGREL BISULFATE 75 MG PO TABS
75.0000 mg | ORAL_TABLET | Freq: Every day | ORAL | Status: DC
  Administered 2019-01-30 – 2019-02-05 (×7): 75 mg via ORAL
  Filled 2019-01-29 (×7): qty 1

## 2019-01-29 MED ORDER — ENOXAPARIN SODIUM 30 MG/0.3ML ~~LOC~~ SOLN
30.0000 mg | SUBCUTANEOUS | Status: DC
  Administered 2019-01-30 – 2019-02-05 (×7): 30 mg via SUBCUTANEOUS
  Filled 2019-01-29 (×7): qty 0.3

## 2019-01-29 MED ORDER — RESOURCE THICKENUP CLEAR PO POWD
ORAL | Status: DC | PRN
  Filled 2019-01-29: qty 125

## 2019-01-29 MED ORDER — PANTOPRAZOLE SODIUM 40 MG PO TBEC
40.0000 mg | DELAYED_RELEASE_TABLET | Freq: Every day | ORAL | Status: DC
  Administered 2019-02-04 – 2019-02-05 (×2): 40 mg via ORAL
  Filled 2019-01-29 (×5): qty 1

## 2019-01-29 MED FILL — Perflutren Lipid Microsphere IV Susp 1.1 MG/ML: INTRAVENOUS | Qty: 10 | Status: AC

## 2019-01-29 NOTE — Discharge Instructions (Signed)
Follow with Primary MD after  discharge  Get CBC, CMP,  checked  by Primary MD next visit.    Activity: As tolerated with Full fall precautions use walker/cane & assistance as needed   Disposition CIR   Diet: Dysphagia 1 with nectar thick, with feeding assistance and aspiration precautions. .   On your next visit with your primary care physician please Get Medicines reviewed and adjusted.   Please request your Prim.MD to go over all Hospital Tests and Procedure/Radiological results at the follow up, please get all Hospital records sent to your Prim MD by signing hospital release before you go home.   If you experience worsening of your admission symptoms, develop shortness of breath, life threatening emergency, suicidal or homicidal thoughts you must seek medical attention immediately by calling 911 or calling your MD immediately  if symptoms less severe.  You Must read complete instructions/literature along with all the possible adverse reactions/side effects for all the Medicines you take and that have been prescribed to you. Take any new Medicines after you have completely understood and accpet all the possible adverse reactions/side effects.   Do not drive, operating heavy machinery, perform activities at heights, swimming or participation in water activities or provide baby sitting services if your were admitted for syncope or siezures until you have seen by Primary MD or a Neurologist and advised to do so again.  Do not drive when taking Pain medications.    Do not take more than prescribed Pain, Sleep and Anxiety Medications  Special Instructions: If you have smoked or chewed Tobacco  in the last 2 yrs please stop smoking, stop any regular Alcohol  and or any Recreational drug use.  Wear Seat belts while driving.   Please note  You were cared for by a hospitalist during your hospital stay. If you have any questions about your discharge medications or the care you  received while you were in the hospital after you are discharged, you can call the unit and asked to speak with the hospitalist on call if the hospitalist that took care of you is not available. Once you are discharged, your primary care physician will handle any further medical issues. Please note that NO REFILLS for any discharge medications will be authorized once you are discharged, as it is imperative that you return to your primary care physician (or establish a relationship with a primary care physician if you do not have one) for your aftercare needs so that they can reassess your need for medications and monitor your lab values.

## 2019-01-29 NOTE — Progress Notes (Signed)
Patient ID: Jill Castaneda, female   DOB: 1928-02-23, 85 y.o.   MRN: [id number] Admit to unit, oriented to medications, rehab routine and plan of care with grandaughter as interpreter for non english speaking pt. States an understanding of information reviewed and safety precautions. Margarito Liner

## 2019-01-29 NOTE — H&P (Signed)
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 Independent with assistive device prior to admission dresses and bathes herself. Presented 01/26/2019 with generalized weakness and slurred speech of acute onset. Cranial CT scan showed advanced chronic ischemic disease with progression of both cerebral cerebellar hemispheres since 2015. No acute change. Patient did not receive TPA.MRI showed infarction posterior limb internal capsule on the right extending into the caudate head. Small area of acute infarction right parietal cortex. Echocardiogram with ejection fraction of 88% normal systolic function. CT angiogram of head and neck negative for large vessel occlusion. Lower extremity Dopplers negative for DVT. Neurology consulted maintained on aspirin and Plavix 3 weeks then Plavix alone. Subcutaneous Lovenox for DVT prophylaxis.Recommendations for 30 day cardiac event monitor.Presently on a dysphagia #1 nectar liquid diet. Hematology consulted 01/28/2019 for thrombocytosis 1024/polycythemia and presently with conservative care follow-up outpatient. Plan to start Hydrea 500 mg daily as swallow improves. Therapy evaluation completed with recommendations of physical medicine rehabilitation consult. Patient was admitted for a compress of rehabilitation program.   Review of Systems  Unable to perform ROS: Language  Constitutional: Negative for fever.  HENT: Negative for hearing loss.   Eyes: Negative for double  vision.  Cardiovascular: Negative for chest pain.  Gastrointestinal: Negative for nausea and vomiting.  Genitourinary: Negative for urgency.  Musculoskeletal: Negative for neck pain.  Skin: Negative for rash.  Neurological: Positive for weakness.  Psychiatric/Behavioral: Negative for depression.        Past Medical History:  Diagnosis Date   Cataract     Complication of anesthesia      Difficulty waking up "   Coronary artery disease     Hip fracture ([place]) 06/2017    right hip   Hypertension     Osteoporosis     Stroke [facility])           Past Surgical History:  Procedure Laterality Date   ANTERIOR APPROACH HEMI HIP ARTHROPLASTY Right 06/20/2017    Procedure: ANTERIOR APPROACH HEMI HIP ARTHROPLASTY;  Surgeon: Rod Can, MD;  Location: [facility];  Service: Orthopedics;  Laterality: Right;   FRACTURE SURGERY       SHOULDER HARDWARE REMOVAL Left     SHOULDER HEMI-ARTHROPLASTY Left     TOTAL HIP ARTHROPLASTY Right           Family History  Problem Relation Age of Onset   Hypertension Father     Heart disease Father     Hypertension Sister     Heart disease Sister     Stroke Mother     Cancer Brother      Social History:  reports that she has never smoked. She has never used smokeless tobacco. She reports that she does not drink alcohol or use drugs. Allergies: No Known Allergies       Medications Prior to Admission  Medication Sig Dispense Refill   amLODipine (NORVASC) 2.5 MG tablet TAKE ONE TABLET BY MOUTH ONCE DAILY (Patient taking differently: TAKE 2.5 mg TABLET BY MOUTH  ONCE DAILY) 90 tablet 0   Ascorbic Acid (VITAMIN C) 1000 MG tablet Take 1,000 mg by mouth daily.       aspirin 81 MG chewable tablet Chew 1 tablet (81 mg total) by mouth 2 (two) times daily with a meal. (Patient taking differently: Chew 81 mg by mouth daily. ) 60 tablet 1   calcium-vitamin D (OSCAL WITH D) 500-200 MG-UNIT tablet Take 1 tablet by mouth 3 (three) times a week.        cholecalciferol (VITAMIN D3) 25 MCG (1000 UT) tablet Take 1,000 Units by mouth daily.       ferrous sulfate 325 (65 FE) MG tablet Take 325 mg by mouth daily with breakfast.       Multiple Vitamin (MULTIVITAMIN WITH MINERALS) TABS tablet Take 2 tablets by mouth daily.        omega-3 acid ethyl esters (LOVAZA) 1 g capsule Take 1 g by mouth daily.          Drug Regimen Review Drug regimen was reviewed and remains appropriate with no significant issues identified   Home: Home Living Family/patient expects to be discharged to:: Private residence Living Arrangements: Other relatives(granddaughter) Available Help at Discharge: Family, Available 24 hours/day Type of Home: House Home Access: Stairs to enter [employer] of Steps: 4 Entrance Stairs-Rails: Right, Left(gdaughter plans to be on one side of her) Home Layout: Two level Alternate Level Stairs-Number of Steps: 13 steps Alternate Level Stairs-Rails: Left Bathroom Shower/Tub: Other (comment)(pt sponge bathes) Bathroom Toilet: Handicapped height Home Equipment: Environmental consultant - 2 wheels, Bedside commode, Toilet riser, Transport chair  Lives With: Family   Functional History: Prior Function Level of Independence: Independent with assistive device(s) Comments: ambulates with RW, dresses and bathes herself   Functional Status:  Mobility: Bed Mobility Overal bed mobility: Needs Assistance Bed Mobility: Supine to Sit, Sit to Supine Supine to sit: Mod assist Sit to supine: Min assist General bed mobility comments: increased time and effort, assistance needed for bilateral LE movement off of bed and for trunk elevation; min A to return L LE onto bed Transfers Overall transfer level: Needs assistance Equipment used: Rolling walker (2 wheeled) Transfers: Sit to/from Stand Sit to Stand: Min assist, +2 safety/equipment, +2 physical assistance General transfer comment: assistance for stability with transition into standing from  sitting EOB; verbal and tactile cueing for safe hand placement Ambulation/Gait Ambulation/Gait assistance: Min assist Gait Distance (Feet): 30 Feet Assistive device: Rolling walker (2 wheeled) Gait Pattern/deviations: Decreased step length - left, Decreased stance time - right, Decreased stride length, Decreased dorsiflexion - left, Decreased weight shift to left, Trunk flexed, Step-to pattern, Step-through pattern General Gait Details: pt with intermittent step through on L LE but decreased with fatigue and required verbal and tactile cueing; pt required constant min A for stability, safety with navigation around obstacles and RW management Gait velocity: decreased   ADL: ADL Overall ADL's : Needs assistance/impaired Eating/Feeding: Moderate assistance, Sitting(LUE poor coordination) Grooming: Minimal assistance, Wash/dry hands, Wash/dry face, Sitting Upper Body Bathing: Minimal assistance, Standing Lower Body Bathing: Maximal assistance, Sit to/from stand Upper Body Dressing : Minimal assistance, Standing Lower Body Dressing: Maximal assistance, Sitting/lateral leans, Sit to/from stand Toilet Transfer: Minimal assistance, BSC, Ambulation Toileting- Clothing Manipulation and Hygiene: Moderate assistance, Sitting/lateral lean, Sit to/from stand Functional mobility during ADLs: Minimal assistance, +2 for physical assistance, Rolling walker, Cueing for safety, Cueing for sequencing General ADL Comments: Pt requiring additional assist for ADL as pt is very weak in LUE/LLE and RUE with  tremors. Pt with low vision- diplopia- already tried glasses taping and pt would not wear them.   Cognition: Cognition Overall Cognitive Status: Within Functional Limits for tasks assessed Orientation Level: Oriented to person, Disoriented to situation, Disoriented to time, Disoriented to place Cognition Arousal/Alertness: Awake/alert Behavior During Therapy: WFL for tasks assessed/performed Overall Cognitive  Status: Within Functional Limits for tasks assessed   Physical Exam: Blood pressure (!) 137/98, pulse 76, temperature 98.1 F (36.7 C), temperature source Oral, resp. rate 16, height 4\' 8"  (1.422 m), weight 45.1 kg, SpO2 94 %. Physical Exam  Constitutional: She is oriented to person, place, and time. No distress.  Frail appearing  HENT:  Head: Normocephalic and atraumatic.  edentulous  Eyes: Pupils are equal, round, and reactive to light. EOM are normal.  Neck: Normal range of motion. No tracheal deviation present. No thyromegaly present.  Cardiovascular: Normal rate and regular rhythm. Exam reveals gallop.  Respiratory: Effort normal. No respiratory distress. She has no wheezes. She has no rales.  GI: Soft. She exhibits no distension. There is no abdominal tenderness.  Neurological: She is alert and oriented to person, place, and time.  granddaughter translated. Pt followed all basic commands. Reasonable insight and awareness. RUE 4/5. LUE 3+ to 4-/5. RLE 4/5. LLE 3 to 4/5 prox to distal. Senses pain and LT in all 4's.   Skin: Skin is warm and dry.  Psychiatric: She has a normal mood and affect. Her behavior is normal. Judgment and thought content normal.      Lab Results Last 48 Hours        Results for orders placed or performed during the hospital encounter of 01/25/19 (from the past 48 hour(s))  CBC     Status: Abnormal    Collection Time: 01/28/19  4:41 AM  Result Value Ref Range    WBC 9.1 4.0 - 10.5 K/uL    RBC 5.73 (H) 3.87 - 5.11 MIL/uL    Hemoglobin 15.5 (H) 12.0 - 15.0 g/dL    HCT 49.5 (H) 36.0 - 46.0 %    MCV 86.4 80.0 - 100.0 fL    MCH 27.1 26.0 - 34.0 pg    MCHC 31.3 30.0 - 36.0 g/dL    RDW 16.3 (H) 11.5 - 15.5 %    Platelets 928 (HH) 150 - 400 K/uL      Comment: REPEATED TO VERIFY THIS CRITICAL RESULT HAS VERIFIED AND BEEN CALLED TO JAMIE STEELMAN,RN BY ZELDA BEECH ON 03 29 2020 AT 0541, AND HAS BEEN READ BACK.       nRBC 0.0 0.0 - 0.2 %      Comment:  Performed at [facility], [place] [address] [place], [place] [address]  Basic metabolic panel     Status: Abnormal    Collection Time: 01/28/19  4:41 AM  Result Value Ref Range    Sodium 140 135 - 145 mmol/L    Potassium 3.7 3.5 - 5.1 mmol/L    Chloride 109 98 - 111 mmol/L    CO2 24 22 - 32 mmol/L    Glucose, Bld 98 70 - 99 mg/dL    BUN 23 8 - 23 mg/dL    Creatinine, Ser 1.20 (H) 0.44 - 1.00 mg/dL    Calcium 8.9 8.9 - 10.3 mg/dL    GFR calc non Af Amer 40 (L) >60 mL/min    GFR calc Af Amer 46 (L) >60 mL/min    Anion gap 7 5 - 15  Comment: Performed at [facility], [place] [address] [place], [place] [address]  Save Smear     Status: None    Collection Time: 01/28/19  4:41 AM  Result Value Ref Range    Smear Review SMEAR STAINED AND AVAILABLE FOR REVIEW        Comment: Performed at [facility] [address], [place], [place] [address]  Technologist smear review     Status: None    Collection Time: 01/28/19  4:41 AM  Result Value Ref Range    Tech Review MORPHOLOGY UNREMARKABLE        Comment: THROMBOCYTOSIS Performed at [facility], [place] [address], [place], [place] [address]    CBC     Status: Abnormal    Collection Time: 01/29/19  7:36 AM  Result Value Ref Range    WBC 9.4 4.0 - 10.5 K/uL    RBC 5.54 (H) 3.87 - 5.11 MIL/uL    Hemoglobin 15.0 12.0 - 15.0 g/dL    HCT 48.1 (H) 36.0 - 46.0 %    MCV 86.8 80.0 - 100.0 fL    MCH 27.1 26.0 - 34.0 pg    MCHC 31.2 30.0 - 36.0 g/dL    RDW 16.1 (H) 11.5 - 15.5 %    Platelets 894 (H) 150 - 400 K/uL    nRBC 0.0 0.0 - 0.2 %      Comment: Performed at [facility], [place] [address], [place], [place] [address]  Basic metabolic panel     Status: Abnormal    Collection Time: 01/29/19  7:36 AM  Result Value Ref Range    Sodium 140 135 - 145 mmol/L    Potassium 4.0 3.5 - 5.1 mmol/L    Chloride 111 98 - 111 mmol/L    CO2 24 22 - 32 mmol/L    Glucose, Bld 95 70 - 99 mg/dL    BUN 24 (H) 8 - 23 mg/dL     Creatinine, Ser 1.05 (H) 0.44 - 1.00 mg/dL    Calcium 8.5 (L) 8.9 - 10.3 mg/dL    GFR calc non Af Amer 47 (L) >60 mL/min    GFR calc Af Amer 55 (L) >60 mL/min    Anion gap 5 5 - 15      Comment: Performed at [facility] [address], [place], [place] [address]       Imaging Results (Last 48 hours)  Dg Chest Port 1 View   Result Date: 01/27/2019 CLINICAL DATA:  Shortness of breath. EXAM: PORTABLE CHEST 1 VIEW COMPARISON:  Frontal and lateral views yesterday. FINDINGS: The cardiomediastinal contours are normal for AP technique. The lungs are clear. Pulmonary vasculature is normal. No consolidation, pleural effusion, or pneumothorax. No acute osseous abnormalities are seen. Unchanged cortical thickening in the left mid humerus likely at the deltoid insertion. Scoliotic curvature in the spine. IMPRESSION: No acute chest findings. Electronically Signed   By: Keith Rake M.D.   On: 01/27/2019 21:34    Vas [place] Lower Extremity Venous (dvt)   Result Date: 01/28/2019  Lower Venous Study Indications: Embolic stroke.  Performing Technologist: Antonieta Pert RDMS, RVT  Examination Guidelines: A complete evaluation includes B-mode imaging, spectral Doppler, color Doppler, and power Doppler as needed of all accessible portions of each vessel. Bilateral testing is considered an integral part of a complete examination. Limited examinations for reoccurring indications may be performed as noted.  Right Venous Findings: +---------+---------------+---------+-----------+----------+-------------------+  Compressibility Phasicity Spontaneity Properties Summary              +---------+---------------+---------+-----------+----------+-------------------+  CFV       Full            Yes       Yes                                         +---------+---------------+---------+-----------+----------+-------------------+  SFJ       Full                                                                   +---------+---------------+---------+-----------+----------+-------------------+  FV Prox   Full                                                                  +---------+---------------+---------+-----------+----------+-------------------+  FV Mid    Full                                                                  +---------+---------------+---------+-----------+----------+-------------------+  FV Distal Full                                                                  +---------+---------------+---------+-----------+----------+-------------------+  PFV       Full                                                                  +---------+---------------+---------+-----------+----------+-------------------+  POP       Full            Yes       Yes                                         +---------+---------------+---------+-----------+----------+-------------------+  PTV       Full                                                                  +---------+---------------+---------+-----------+----------+-------------------+  PERO  not well visualized  +---------+---------------+---------+-----------+----------+-------------------+  GSV       Full                                                                  +---------+---------------+---------+-----------+----------+-------------------+  Left Venous Findings: +---------+---------------+---------+-----------+----------+-------+            Compressibility Phasicity Spontaneity Properties Summary  +---------+---------------+---------+-----------+----------+-------+  CFV       Full            Yes       Yes                             +---------+---------------+---------+-----------+----------+-------+  SFJ       Full                                                      +---------+---------------+---------+-----------+----------+-------+  FV Prox   Full                                                       +---------+---------------+---------+-----------+----------+-------+  FV Mid    Full                                                      +---------+---------------+---------+-----------+----------+-------+  FV Distal Full                                                      +---------+---------------+---------+-----------+----------+-------+  PFV       Full                                                      +---------+---------------+---------+-----------+----------+-------+  POP       Full            Yes       Yes                             +---------+---------------+---------+-----------+----------+-------+  PTV       Full                                                      +---------+---------------+---------+-----------+----------+-------+  PERO      Full                                                      +---------+---------------+---------+-----------+----------+-------+  GSV       Full                                                      +---------+---------------+---------+-----------+----------+-------+    Summary: Right: There is no evidence of deep vein thrombosis in the lower extremity. However, portions of this examination were limited- see technologist comments above. No cystic structure found in the popliteal fossa. Left: There is no evidence of deep vein thrombosis in the lower extremity. No cystic structure found in the popliteal fossa.  *See table(s) above for measurements and observations. Electronically signed by Deitra Mayo MD on 01/28/2019 at 6:45:31 AM.    Final              Medical Problem List and Plan: 1.  Left side weakness with dysarthria/dysphagia secondary to acute infarct in the right posterior limb of internal capsule, caudate body, and right parietal cortex. Plan 30 day event monitor on outpatient. Aspirin Plavix 3 weeks then Plavix alone             -admit to inpatient rehab 2.  Antithrombotics: -DVT/anticoagulation:  Subcutaneous Lovenox              -antiplatelet therapy: Aspirin 81 mg daily, Plavix 75 mg daily 3. Pain Management: Tylenol as needed 4. Mood: Provide emotional support             -antipsychotic agents: N/A 5. Neuropsych: This patient is capable of making decisions on her own behalf. 6. Skin/Wound Care:  Routine skin checks 7. Fluids/Electrolytes/Nutrition:  Routine in and out's with follow-up chemistries 8. Dysphagia. Dysphagia #1 nectar liquids. Follow-up speech therapy 9. Hyperlipidemia. Lipitor 10. 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 Her normal platelet count is between 800 and 100k.             - Plan to begin Hydrea 500 mg daily once swallow function improves             -pt received 1/2 unit blood phlebotomy prior to discharge to rehab today 11. History of hypertension. Patient on Norvasc 2.5 mg daily prior to admission. Resume as needed    Post Admission Physician Evaluation: 1. Functional deficits secondary  to right PLIC, caudate body, right parietal cortex--embolic or due to thrombosis. 2. Patient is admitted to receive collaborative, interdisciplinary care between the physiatrist, rehab nursing staff, and therapy team. 3. Patient's level of medical complexity and substantial therapy needs in context of that medical necessity cannot be provided at a lesser intensity of care such as a SNF. 4. Patient has experienced substantial functional loss from his/her baseline which was documented above under the "Functional History" and "Functional Status" headings.  Judging by the patient's diagnosis, physical exam, and functional history, the patient has potential for functional progress which will result in measurable gains while on inpatient rehab.  These gains will be of substantial and practical use upon discharge  in facilitating mobility and self-care at the household level. 5. Physiatrist will provide 24 hour  management of medical needs as well as oversight of the therapy plan/treatment and provide guidance as appropriate regarding the interaction of the two. 6. The Preadmission Screening has been reviewed and patient status is unchanged  unless otherwise stated above. 7. 24 hour rehab nursing will assist with bladder management, bowel management, safety, skin/wound care, disease management, medication administration, pain management and patient education  and help integrate therapy concepts, techniques,education, etc. 8. PT will assess and treat for/with: Lower extremity strength, range of motion, stamina, balance, functional mobility, safety, adaptive techniques and equipment, NMR, family education.   Goals are: supervision. 9. OT will assess and treat for/with: ADL's, functional mobility, safety, upper extremity strength, adaptive techniques and equipment, NMR, family ed, community reentry.   Goals are: supervision. Therapy may proceed with showering this patient. 10. SLP will assess and treat for/with: cognition, communication.  Goals are: supervision. 11. Case Management and Social Worker will assess and treat for psychological issues and discharge planning. 12. Team conference will be held weekly to assess progress toward goals and to determine barriers to discharge. 13. Patient will receive at least 3 hours of therapy per day at least 5 days per week. 14. ELOS: 10-12 days       15. Prognosis:  excellent   I have personally performed a face to face diagnostic evaluation of this patient and formulated the key components of the plan.  Additionally, I have personally reviewed laboratory data, imaging studies, as well as relevant notes and concur with the physician assistant's documentation above.  Meredith Staggers, MD, FAAPMR     Lavon Paganini Noel, PA-C 01/29/2019

## 2019-01-29 NOTE — Progress Notes (Signed)
250 mls drawn from the left PIV per MD's order. No adverse symptoms noted at this time.

## 2019-01-29 NOTE — PMR Pre-admission (Signed)
PMR Admission Coordinator Pre-Admission Assessment  Patient: Jill Castaneda is an 85 y.o., female MRN: [id number] DOB: 1928-06-22 Height: 4' 8"  (142.2 cm) Weight: 45.1 kg  Insurance Information HMO:     PPO:      PCP:      IPA:      80/20:      OTHER:  PRIMARY: Uninsured--Medicaid Potential      Policy#:       Subscriber:  CM Name:       Phone#:      Fax#:  Pre-Cert#:       Employer:  Benefits:  Phone #:      Name:  Eff. Date:      Deduct:       Out of Pocket Max:       Life Max:  CIR:       SNF:  Outpatient:      Co-Pay:  Home Health:       Co-Pay:  DME:      Co-Pay:  Providers:  SECONDARY:       Policy#:       Subscriber:  CM Name:       Phone#:      Fax#:  Pre-Cert#:       Employer:  Benefits:  Phone #:      Name:  Eff. Date:      Deduct:       Out of Pocket Max:       Life Max:  CIR:       SNF:  Outpatient:      Co-Pay:  Home Health:       Co-Pay:  DME:      Co-Pay:   Medicaid Application Date:       Case Manager:  Disability Application Date:       Case Worker:    Emergency Contact Information Contact Information    Name Relation Home Work [place] Granddaughter [phone]  [phone]      Current Medical History  Patient Admitting Diagnosis: R CVA posterior limb internal capsule  History of Present Illness: Jill Castaneda is an 85 year old right handed non- English-speaking female with history of CAD, left midbrain and left thalamic infarct in 2015 with residual diplopia and right side weakness with tremor, hypertension, history of pulmonary emboli and hip replacement. Presented 01/26/2019 with generalized weakness and slurred speech of acute onset. Cranial CT scan showed advanced chronic ischemic disease with progression of both cerebral cerebellar hemispheres since 2015. No acute change. Patient did not receive TPA.  MRI showed infarction posterior limb internal capsule on the right extending into the caudate head. Small area of acute infarction right  parietal cortex. Echocardiogram with ejection fraction of 65% normal systolic function. CT angiogram of head and neck negative for large vessel occlusion. Lower extremity Dopplers negative for DVT. Neurology consulted maintained on aspirin and Plavix 3 weeks then Plavix alone. Subcutaneous Lovenox for DVT prophylaxis. Recommendations for 30 day cardiac event monitor.  Presently on a dysphagia #1 nectar liquid diet. Hematology consulted 01/28/2019 for thrombocytosis 1024/polycythemia and presently with conservative care follow-up outpatient. Plan to start Hydrea 500 mg daily as swallow improves.    Complete NIHSS TOTAL: 10  Patient[facility] has been reviewed by the rehabilitation admission coordinator and physician.  Past Medical History  Past Medical History:  Diagnosis Date  . Cataract   . Complication of anesthesia    Difficulty waking up "  . Coronary artery disease   .  Hip fracture ([place]) 06/2017   right hip  . Hypertension   . Osteoporosis   . Stroke [facility])     Family History   family history includes Cancer in her brother; Heart disease in her father and sister; Hypertension in her father and sister; Stroke in her mother.  Prior Rehab/Hospitalizations Has the patient had prior rehab or hospitalizations prior to admission? No  Has the patient had major surgery during 100 days prior to admission? No   Current Medications  Current Facility-Administered Medications:  .  0.9 %  sodium chloride infusion, , Intravenous, Continuous, Elgergawy, Silver Huguenin, MD, Last Rate: 50 mL/hr at 01/29/19 0500 .  acetaminophen (TYLENOL) tablet 650 mg, 650 mg, Oral, Q4H PRN **OR** acetaminophen (TYLENOL) solution 650 mg, 650 mg, Per Tube, Q4H PRN **OR** acetaminophen (TYLENOL) suppository 650 mg, 650 mg, Rectal, Q4H PRN, Opyd, Timothy S, MD .  aspirin EC tablet 81 mg, 81 mg, Oral, Daily, Rosalin Hawking, MD, 81 mg at 01/29/19 1113 .  atorvastatin (LIPITOR) tablet 40 mg, 40  mg, Oral, q1800, Rosalin Hawking, MD, 40 mg at 01/28/19 1840 .  clopidogrel (PLAVIX) tablet 75 mg, 75 mg, Oral, Daily, Rosalin Hawking, MD, 75 mg at 01/29/19 1113 .  enoxaparin (LOVENOX) injection 30 mg, 30 mg, Subcutaneous, Q24H, Opyd, Ilene Qua, MD, 30 mg at 01/29/19 1108 .  labetalol (NORMODYNE,TRANDATE) injection 5 mg, 5 mg, Intravenous, Q2H PRN, Opyd, Timothy S, MD .  pantoprazole (PROTONIX) EC tablet 40 mg, 40 mg, Oral, Daily, Elgergawy, Silver Huguenin, MD .  Resource ThickenUp Clear, , Oral, PRN, Eliseo Squires, Jessica U, DO .  senna-docusate (Senokot-S) tablet 1 tablet, 1 tablet, Oral, QHS PRN, Opyd, Ilene Qua, MD  Patients Current Diet:  Diet Order            DIET - DYS 1 Room service appropriate? Yes with Assist; Fluid consistency: Nectar Thick  Diet effective now              Precautions / Restrictions Precautions Precautions: Fall Restrictions Weight Bearing Restrictions: No   Has the patient had 2 or more falls or a fall with injury in the past year? No  Prior Activity Level Community (5-7x/wk): Was walking 1 mile/day with her grand daughter, occasional hand held assist.  Prior Functional Level Self Care: Did the patient need help bathing, dressing, using the toilet or eating? Independent mostly.  Granddaughter reports pt did have supervision with bathing.  Indoor Mobility: Did the patient need assistance with walking from room to room (with or without device)? Independent  Stairs: Did the patient need assistance with internal or external stairs (with or without device)? Independent  Functional Cognition: Did the patient need help planning regular tasks such as shopping or remembering to take medications? Independent  Home Assistive Devices / Equipment Home Assistive Devices/Equipment: None Home Equipment: Walker - 2 wheels, Bedside commode, Toilet riser, Transport chair  Prior Device Use: Indicate devices/aids used by the patient prior to current illness, exacerbation or injury? None  of the above  Current Functional Level Cognition  Overall Cognitive Status: Within Functional Limits for tasks assessed Orientation Level: Oriented to person, Disoriented to situation, Disoriented to time, Disoriented to place    Extremity Assessment (includes Sensation/Coordination)  Upper Extremity Assessment: Generalized weakness, RUE deficits/detail, LUE deficits/detail RUE Deficits / Details: tremor RUE Sensation: WNL RUE Coordination: decreased fine motor LUE Deficits / Details: decreased coordination LUE Sensation: WNL LUE Coordination: WNL  Lower Extremity Assessment: Generalized weakness, Defer to PT evaluation, LLE deficits/detail LLE  Deficits / Details: LLE drags on floor, but WFLs for strength testing    ADLs  Overall ADL's : Needs assistance/impaired Eating/Feeding: Moderate assistance, Sitting(LUE poor coordination) Grooming: Minimal assistance, Wash/dry hands, Wash/dry face, Sitting Upper Body Bathing: Minimal assistance, Standing Lower Body Bathing: Maximal assistance, Sit to/from stand Upper Body Dressing : Minimal assistance, Standing Lower Body Dressing: Maximal assistance, Sitting/lateral leans, Sit to/from stand Toilet Transfer: Minimal assistance, BSC, Ambulation Toileting- Clothing Manipulation and Hygiene: Moderate assistance, Sitting/lateral lean, Sit to/from stand Functional mobility during ADLs: Minimal assistance, +2 for physical assistance, Rolling walker, Cueing for safety, Cueing for sequencing General ADL Comments: Pt requiring additional assist for ADL as pt is very weak in LUE/LLE and RUE with tremors. Pt with low vision- diplopia- already tried glasses taping and pt would not wear them.    Mobility  Overal bed mobility: Needs Assistance Bed Mobility: Supine to Sit, Sit to Supine Supine to sit: Mod assist Sit to supine: Min assist General bed mobility comments: increased time and effort, assistance needed for bilateral LE movement off of bed and  for trunk elevation; min A to return L LE onto bed    Transfers  Overall transfer level: Needs assistance Equipment used: Rolling walker (2 wheeled) Transfers: Sit to/from Stand Sit to Stand: Min assist, +2 safety/equipment, +2 physical assistance General transfer comment: assistance for stability with transition into standing from sitting EOB; verbal and tactile cueing for safe hand placement    Ambulation / Gait / Stairs / Wheelchair Mobility  Ambulation/Gait Ambulation/Gait assistance: Herbalist (Feet): 30 Feet Assistive device: Rolling walker (2 wheeled) Gait Pattern/deviations: Decreased step length - left, Decreased stance time - right, Decreased stride length, Decreased dorsiflexion - left, Decreased weight shift to left, Trunk flexed, Step-to pattern, Step-through pattern General Gait Details: pt with intermittent step through on L LE but decreased with fatigue and required verbal and tactile cueing; pt required constant min A for stability, safety with navigation around obstacles and RW management Gait velocity: decreased    Posture / Balance Balance Overall balance assessment: Needs assistance Sitting-balance support: Feet supported Sitting balance-Leahy Scale: Fair Standing balance support: Bilateral upper extremity supported Standing balance-Leahy Scale: Poor    Special needs/care consideration BiPAP/CPAP no CPM no Continuous Drip IV 0.9% sodium chloride infusion at 50 mL/hr Dialysis no        Days n/a Life Vest no Oxygen no Special Bed no Trach Size no Wound Vac (area) no      Location n/a Skin intact                              Bowel mgmt: incontinent, last BM 01/28/2019 Bladder mgmt: incontinent Diabetic mgmt: no Behavioral consideration no Chemo/radiation no Interpreter: pt's granddaughter interpreting for patient as her speech is very slurred and very soft.  She has stayed this whole admission, in patient's room, and has not traveled to/from the  hospital.  This was agreed upon on acute care.     Previous Home Environment (from acute therapy documentation) Living Arrangements: Other relatives(granddaughter)  Lives With: Family Available Help at Discharge: Family, Available 24 hours/day Type of Home: House Home Layout: Two level Alternate Level Stairs-Rails: Left Alternate Level Stairs-Number of Steps: 13 steps Home Access: Stairs to enter Entrance Stairs-Rails: Right, Left(gdaughter plans to be on one side of her) Entrance Stairs-Number of Steps: 4 Bathroom Shower/Tub: Other (comment)(pt sponge bathes) Bathroom Toilet: Handicapped height Home Care Services: No  Discharge Living Setting Plans for Discharge Living Setting: Patient's home Type of Home at Discharge: Other (Comment)(townhome) Discharge Home Layout: Two level, Able to live on main level with bedroom/bathroom Alternate Level Stairs-Rails: Left Alternate Level Stairs-Number of Steps: 13 Discharge Home Access: Stairs to enter Entrance Stairs-Rails: Right, Left Entrance Stairs-Number of Steps: 4 Discharge Bathroom Shower/Tub: Tub/shower unit Discharge Bathroom Toilet: Standard Discharge Bathroom Accessibility: Yes How Accessible: Accessible via walker Does the patient have any problems obtaining your medications?: No  Social/Family/Support Systems Anticipated Caregiver: granddaughter, Ana Anticipated Caregiver's Contact Information: [phone] Ability/Limitations of Caregiver: none Caregiver Availability: 24/7(Ana works wkends in [employer], will arrange private caregiver ) Discharge Plan Discussed with Primary Caregiver: Yes Is Caregiver In Agreement with Plan?: Yes Does Caregiver/Family have Issues with Lodging/Transportation while Pt is in Rehab?: No  Goals/Additional Needs Patient/Family Goal for Rehab: PT/OT/SLP supervision Expected length of stay: 10-12 days Dietary Needs: D2/nectar Equipment Needs: tbd Special Service Needs: pt's granddaughter  interprets for her.  Interpreters have not been able to understand pt due to slurred and soft speech Pt/Family Agrees to Admission and willing to participate: Yes Program Orientation Provided & Reviewed with Pt/Caregiver Including Roles  & Responsibilities: Yes  Possible need for SNF placement upon discharge: not anticipated  Patient Condition: I have reviewed medical records from [facility], spoken with CSW, CM, and granddaughter. I met with patient and granddaughter at the bedside and for inpatient rehabilitation assessment.  Patient will benefit from ongoing PT, OT, and SLP, can actively participate in 3 hours of therapy a day 5 days of the week, and can make measurable gains during the admission.  Patient will also benefit from the coordinated team approach during an Inpatient Acute Rehabilitation admission.  The patient will receive intensive therapy as well as Rehabilitation physician, nursing, social worker, and care management interventions.  Due to bowel management, bladder management, safety, skin/wound care, disease management, medical administration, pain management, and patient education the patient requires 24 hour a day rehabilitation nursing.  The patient is currently min to mod with mobility and basic ADLs.  Discharge setting and therapy post discharge at home with home health is anticipated.  Patient has agreed to participate in the Acute Inpatient Rehabilitation Program and will admit today.  Preadmission Screen Completed By:  Michel Santee, 01/29/2019 12:45 PM ______________________________________________________________________   Discussed status with Dr. Naaman Plummer on 01/29/19 at 12:52 PM  and received approval for admission today.  Admission Coordinator:  Michel Santee, PT, DPT time 12:52 PM Sudie Grumbling 01/29/19    Assessment/Plan: Diagnosis: Right PLIC, caudate, parietal cortex infarct, thrombocytosis 1. Does the need for close, 24 hr/day Medical supervision in concert  with the patient's rehab needs make it unreasonable for this patient to be served in a less intensive setting? Yes 2. Co-Morbidities requiring supervision/potential complications: CAD, prior CVA,  3. Due to bladder management, bowel management, safety, skin/wound care, disease management, medication administration, pain management and patient education, does the patient require 24 hr/day rehab nursing? Yes 4. Does the patient require coordinated care of a physician, rehab nurse, PT (1-2 hrs/day, 5 days/week), OT (1-2 hrs/day, 5 days/week) and SLP (1-2 hrs/day, 5 days/week) to address physical and functional deficits in the context of the above medical diagnosis(es)? Yes Addressing deficits in the following areas: balance, endurance, locomotion, strength, transferring, bowel/bladder control, bathing, dressing, feeding, grooming, toileting, cognition, speech, swallowing and psychosocial support 5. Can the patient actively participate in an intensive therapy program of at least 3 hrs of therapy 5 days  a week? Yes 6. The potential for patient to make measurable gains while on inpatient rehab is excellent 7. Anticipated functional outcomes upon discharge from inpatients are: supervision PT, supervision OT, supervision SLP 8. Estimated rehab length of stay to reach the above functional goals is: 10-12 days 9. Anticipated D/C setting: Home 10. Anticipated post D/C treatments: [facility] therapy 11. Overall Rehab/Functional Prognosis: good  MD Signature: Meredith Staggers, MD, [facility] 01/29/2019

## 2019-01-29 NOTE — Discharge Summary (Signed)
Jill Castaneda, is a 85 y.o. female  DOB 01-06-1928  MRN [id number].  Admission date:  01/25/2019  Admitting Physician  Vianne Bulls, MD  Discharge Date:  01/29/2019   Primary MD  Patient, No Pcp Per  Recommendations for primary care physician for things to follow:  -Start hydroxyurea 500 mg oral daily patient is able to swallow pills - Recommend outpatient 30-day cardiac event monitoring to rule out A. 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[place])      Past Medical History:  Diagnosis Date   Cataract    Complication of anesthesia    Difficulty waking up "   Coronary artery disease    Hip fracture ([place]) 06/2017   right hip   Hypertension    Osteoporosis    Stroke [facility])     Past Surgical History:  Procedure Laterality Date   ANTERIOR APPROACH HEMI HIP ARTHROPLASTY Right 06/20/2017   Procedure: ANTERIOR APPROACH HEMI HIP ARTHROPLASTY;  Surgeon: Rod Can, MD;  Location: [facility];  Service: Orthopedics;  Laterality: Right;   FRACTURE SURGERY     SHOULDER HARDWARE REMOVAL Left    SHOULDER HEMI-ARTHROPLASTY Left    TOTAL HIP ARTHROPLASTY Right        History of present illness and  [facility] Course:     Kindly see H&P for history of present illness and admission details, please review complete Labs, Consult reports and Test reports for all details in brief  HPI  from the history  and physical done on the day of admission 01/26/2019   HPI: [name] is a 85 y.o. female with medical history significant for coronary artery disease, multiple strokes with residual deficits, hypertension, and history of PE after a hip replacement, now presenting to emergency department for evaluation of generalized weakness and speech disturbance.  Patient is accompanied by her granddaughter who assists with the history.  She had reportedly been in her usual state of health and was having an uneventful day when she was noted to be slumped over in her chair, drooling, and with slurred speech.  This was at approximately 9:30 PM on 01/25/2019.  Patient had not been complaining of anything and specifically denies chest pain, palpitations, or headache.  She has chronic gaze deviation and some right upper extremity deficits from her prior CVAs and no new focal weakness or numbness has been noted.  There was no fall or trauma preceding this.  Patient has not been coughing, denies dysuria or abdominal pain, and has not been febrile.  ED Course: Upon arrival to the ED, patient is found to be  afebrile, saturating well on room air, diastolic blood pressure 073, and vitals otherwise normal.  EKG features a sinus rhythm with RBBB and LAFB.  Chest x-ray is negative for acute cardiopulmonary disease.  Noncontrast head CT is negative for acute intracranial hemorrhage or mass-effect, but notable for advanced chronic cerebral ischemic disease that has progressed since 2015 with several age-indeterminate areas.  Chemistry panel is unremarkable and CBC notable for polycythemia and thrombocytosis.  Urinalysis features 5 ketones.  Neurology was consulted by the ED physician and MRI brain and hospitalist admission to [facility] was recommended.   [facility] Course    85 y.o.femalewith medical history significant forcoronary artery disease, multiple strokes with residual deficits, hypertension, and history of PE after a  hip replacement, now presenting to emergency department for evaluation of generalized weakness and speech disturbance, her work-up significant for acute CVA, as well she noted to have polycythemia, thrombocythemia.  Acute CVA - MRI showing acute infarct in the posterior limb of internal capsule on the right extending into the caudate body, with small area of acute infarct right parietal cortex, patient main complaints is dysarthria, as well she has significant dysphagia. - She is followed by SLP, on dysphagia 1 with thickened liquid diet . - CTA head and neck with no evidence of large vessel occlusion - 2 D echo with a preserved EF, and no evidence of embolic sourceat -Consult with PT/OT/SLP  - Continue with aspirin and Plavix, to continue for 3-week with dual antiplatelet therapy, then Plavix alone - seen By PT, recommendation for CIR -Hemoglobin A1c at 5.8, LDL is 154 -Remains with significant dysphagia currently on dysphagia 1 with thick liquid  polycythemia/thrombocythemia -Discussed with the granddaughter, patient usually following with [facility] clinic, her platelet count usually between 800 K to 1000 K, elevated hemoglobin. 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 -Recommendation for half unit blood phlebotomy ,  to be performed by IV team prior to discharge today -Hydroxyurea 500 mg oral daily when able to swallow -Recommendation to continue aspirin and Plavix(to continue beyond the 3 weeks recommended neurology)  Hyperipidemia -LDL is 154, currently on statin  Hypertension -Pressure acceptable, continue to hold amlodipine on discharge, resume if blood pressure started to increase  CAD -No anginal complaints -Continue ASA  AKI -Continue is 1.2 today, resolved with IV fluid, creatinine 1.05 today  Acute  hypoxic respiratory failure -Resolved -Yesterday morning she required 4 L nasal cannula, all has resolved with encouragement of using incentive spirometry, she is on room air today .    Discharge Condition:  stable   Follow UP  Follow-up Information    Garvin Fila, MD. Schedule an appointment as soon as possible for a visit in 4 week(s).   Specialties:  Neurology, Radiology Contact information: [address] [place] [address] [phone]        Truitt Merle, MD Follow up.   Specialties:  Hematology, Oncology Contact information: [place] [address] [phone]             Discharge Instructions  and  Discharge Medications   Discharge Instructions    Ambulatory referral to Neurology   Complete by:  As directed    Follow up with Dr. Leonie Man at [facility] in 4-6 weeks. Too complicated for RN to follow. Thanks.   Discharge instructions   Complete by:  As directed  Follow with Primary MD after  discharge  Get CBC, CMP,  checked  by Primary MD next visit.    Activity: As tolerated with Full fall precautions use walker/cane & assistance as needed   Disposition CIR   Diet: Dysphagia 1 with nectar thick, with feeding assistance and aspiration precautions. .   On your next visit with your primary care physician please Get Medicines reviewed and adjusted.   Please request your Prim.MD to go over all [facility] Tests and Procedure/Radiological results at the follow up, please get all [facility] records sent to your Prim MD by signing [facility] release before you go home.   If you experience worsening of your admission symptoms, develop shortness of breath, life threatening emergency, suicidal or homicidal thoughts you must seek medical attention immediately by calling 911 or calling your MD immediately  if symptoms less severe.  You Must read complete instructions/literature along with all the possible adverse reactions/side  effects for all the Medicines you take and that have been prescribed to you. Take any new Medicines after you have completely understood and accpet all the possible adverse reactions/side effects.   Do not drive, operating heavy machinery, perform activities at heights, swimming or participation in water activities or provide baby sitting services if your were admitted for syncope or siezures until you have seen by Primary MD or a Neurologist and advised to do so again.  Do not drive when taking Pain medications.    Do not take more than prescribed Pain, Sleep and Anxiety Medications  Special Instructions: If you have smoked or chewed Tobacco  in the last 2 yrs please stop smoking, stop any regular Alcohol  and or any Recreational drug use.  Wear Seat belts while driving.   Please note  You were cared for by a hospitalist during your [facility] stay. If you have any questions about your discharge medications or the care you received while you were in the [facility] after you are discharged, you can call the unit and asked to speak with the hospitalist on call if the hospitalist that took care of you is not available. Once you are discharged, your primary care physician will handle any further medical issues. Please note that NO REFILLS for any discharge medications will be authorized once you are discharged, as it is imperative that you return to your primary care physician (or establish a relationship with a primary care physician if you do not have one) for your aftercare needs so that they can reassess your need for medications and monitor your lab values.   Increase activity slowly   Complete by:  As directed      Allergies as of 01/29/2019   No Known Allergies     Medication List    STOP taking these medications   amLODipine 2.5 MG tablet Commonly known as:  NORVASC   ferrous sulfate 325 (65 FE) MG tablet     TAKE these medications   acetaminophen 325 MG tablet Commonly known as:   TYLENOL Take 2 tablets (650 mg total) by mouth every 4 (four) hours as needed for mild pain (or temp > 37.5 C (99.5 F)).   aspirin 81 MG chewable tablet Chew 1 tablet (81 mg total) by mouth 2 (two) times daily with a meal. What changed:  when to take this   atorvastatin 40 MG tablet Commonly known as:  LIPITOR Take 1 tablet (40 mg total) by mouth daily at 6 PM.   calcium-vitamin D 500-200 MG-UNIT tablet Commonly  known as:  OSCAL WITH D Take 1 tablet by mouth 3 (three) times a week.   cholecalciferol 25 MCG (1000 UT) tablet Commonly known as:  VITAMIN D3 Take 1,000 Units by mouth daily.   clopidogrel 75 MG tablet Commonly known as:  PLAVIX Take 1 tablet (75 mg total) by mouth daily. Start taking on:  January 30, 2019   enoxaparin 30 MG/0.3ML injection Commonly known as:  LOVENOX Inject 0.3 mLs (30 mg total) into the skin daily. Start taking on:  January 30, 2019   multivitamin with minerals Tabs tablet Take 2 tablets by mouth daily.   omega-3 acid ethyl esters 1 g capsule Commonly known as:  LOVAZA Take 1 g by mouth daily.   pantoprazole 40 MG tablet Commonly known as:  PROTONIX Take 1 tablet (40 mg total) by mouth daily.   vitamin C 1000 MG tablet Take 1,000 mg by mouth daily.         Diet and Activity recommendation: See Discharge Instructions above   Consults obtained -  Neurology Hematology/Oncology   Major procedures and Radiology Reports - PLEASE review detailed and final reports for all details, in brief -      Ct Angio Head W Or Wo Contrast  Result Date: 01/26/2019 CLINICAL DATA:  Stroke EXAM: CT ANGIOGRAPHY HEAD AND NECK TECHNIQUE: Multidetector CT imaging of the head and neck was performed using the standard protocol during bolus administration of intravenous contrast. Multiplanar CT image reconstructions and MIPs were obtained to evaluate the vascular anatomy. Carotid stenosis measurements (when applicable) are obtained utilizing NASCET criteria,  using the distal internal carotid diameter as the denominator. CONTRAST:  66mL OMNIPAQUE IOHEXOL 350 MG/ML SOLN COMPARISON:  MRI head 01/26/2019.  CT head 01/25/2019 FINDINGS: CTA NECK FINDINGS Aortic arch: Mild atherosclerotic disease aortic arch without aneurysm or dissection. Proximal great vessels widely patent Right carotid system: Widely patent without atherosclerotic disease or dissection Left carotid system: Widely patent without atherosclerotic disease or dissection Vertebral arteries: Both vertebral arteries widely patent to the basilar Skeleton: Mild cervical spine degenerative change. No acute skeletal abnormality. Other neck: Negative Upper chest: Lung apices clear bilaterally. Review of the MIP images confirms the above findings CTA HEAD FINDINGS Anterior circulation: No significant stenosis, proximal occlusion, aneurysm, or vascular malformation. Posterior circulation: No significant stenosis, proximal occlusion, aneurysm, or vascular malformation. Venous sinuses: Not well evaluated due to arterial phase scanning Anatomic variants: None Delayed phase: No acute intracranial abnormality. Extensive chronic microvascular ischemia. Review of the MIP images confirms the above findings IMPRESSION: 1. Negative for large vessel occlusion. Acute infarcts on the right are consistent with small vessel occlusion. Extensive chronic microvascular ischemic change in the brain 2. No significant carotid or vertebral atherosclerotic disease in the neck. Electronically Signed   By: Franchot Gallo M.D.   On: 01/26/2019 13:07   Dg Chest 2 View  Result Date: 01/26/2019 CLINICAL DATA:  Altered mental status. EXAM: CHEST - 2 VIEW COMPARISON:  Chest radiograph 06/19/2017. CT 06/23/2017 FINDINGS: The patient is rotated. Heart size normal for technique. Aortic tortuosity. No pulmonary edema. No focal airspace disease. No pleural effusion or pneumothorax. Bones are under mineralized with scoliosis. IMPRESSION: Rotated exam  without acute abnormality. Electronically Signed   By: Keith Rake M.D.   On: 01/26/2019 00:14   Ct Angio Neck W Or Wo Contrast  Result Date: 01/26/2019 CLINICAL DATA:  Stroke EXAM: CT ANGIOGRAPHY HEAD AND NECK TECHNIQUE: Multidetector CT imaging of the head and neck was performed using the standard  protocol during bolus administration of intravenous contrast. Multiplanar CT image reconstructions and MIPs were obtained to evaluate the vascular anatomy. Carotid stenosis measurements (when applicable) are obtained utilizing NASCET criteria, using the distal internal carotid diameter as the denominator. CONTRAST:  35mL OMNIPAQUE IOHEXOL 350 MG/ML SOLN COMPARISON:  MRI head 01/26/2019.  CT head 01/25/2019 FINDINGS: CTA NECK FINDINGS Aortic arch: Mild atherosclerotic disease aortic arch without aneurysm or dissection. Proximal great vessels widely patent Right carotid system: Widely patent without atherosclerotic disease or dissection Left carotid system: Widely patent without atherosclerotic disease or dissection Vertebral arteries: Both vertebral arteries widely patent to the basilar Skeleton: Mild cervical spine degenerative change. No acute skeletal abnormality. Other neck: Negative Upper chest: Lung apices clear bilaterally. Review of the MIP images confirms the above findings CTA HEAD FINDINGS Anterior circulation: No significant stenosis, proximal occlusion, aneurysm, or vascular malformation. Posterior circulation: No significant stenosis, proximal occlusion, aneurysm, or vascular malformation. Venous sinuses: Not well evaluated due to arterial phase scanning Anatomic variants: None Delayed phase: No acute intracranial abnormality. Extensive chronic microvascular ischemia. Review of the MIP images confirms the above findings IMPRESSION: 1. Negative for large vessel occlusion. Acute infarcts on the right are consistent with small vessel occlusion. Extensive chronic microvascular ischemic change in the  brain 2. No significant carotid or vertebral atherosclerotic disease in the neck. Electronically Signed   By: Franchot Gallo M.D.   On: 01/26/2019 13:07   Mr Brain Wo Contrast  Result Date: 01/26/2019 CLINICAL DATA:  Dysarthria.  History of stroke. EXAM: MRI HEAD WITHOUT CONTRAST TECHNIQUE: Multiplanar, multiecho pulse sequences of the brain and surrounding structures were obtained without intravenous contrast. COMPARISON:  CT head 01/25/2019.  MRI head 03/09/2014 FINDINGS: Brain: Acute infarct in the right basal ganglia involving the posterior limb internal capsule and caudate body. Small area of acute infarct in the right parietal cortex. Mild atrophy. Extensive chronic ischemic changes throughout the white matter. Chronic infarcts in the thalamus bilaterally. Small chronic infarcts in the cerebellum bilaterally. Chronic infarct in the left midbrain. Negative for hemorrhage mass or midline shift. Vascular: Normal arterial flow voids Skull and upper cervical spine: Negative Sinuses/Orbits: Mild mucosal edema paranasal sinuses. Bilateral cataract surgery Other: None IMPRESSION: Acute infarct in the posterior limb internal capsule on the right extending into the caudate body. Small area of acute infarct right parietal cortex Advanced chronic microvascular ischemia. Electronically Signed   By: Franchot Gallo M.D.   On: 01/26/2019 07:10   Dg Chest Port 1 View  Result Date: 01/27/2019 CLINICAL DATA:  Shortness of breath. EXAM: PORTABLE CHEST 1 VIEW COMPARISON:  Frontal and lateral views yesterday. FINDINGS: The cardiomediastinal contours are normal for AP technique. The lungs are clear. Pulmonary vasculature is normal. No consolidation, pleural effusion, or pneumothorax. No acute osseous abnormalities are seen. Unchanged cortical thickening in the left mid humerus likely at the deltoid insertion. Scoliotic curvature in the spine. IMPRESSION: No acute chest findings. Electronically Signed   By: Keith Rake  M.D.   On: 01/27/2019 21:34   Dg Swallowing Func-speech Pathology  Result Date: 01/27/2019 Objective Swallowing Evaluation: Type of Study: MBS-Modified Barium Swallow Study  Patient Details Name: Latrece Nitta MRN: [id number] Date of Birth: 1928/03/26 Today's Date: 01/27/2019 Time: SLP Start Time (ACUTE ONLY): 1211 -SLP Stop Time (ACUTE ONLY): 1234 SLP Time Calculation (min) (ACUTE ONLY): 23 min Past Medical History: Past Medical History: Diagnosis Date  Cataract   Complication of anesthesia   Difficulty waking up "  Coronary artery disease   Hip  fracture ([place]) 06/2017  right hip  Hypertension   Osteoporosis   Stroke [facility])  Past Surgical History: Past Surgical History: Procedure Laterality Date  ANTERIOR APPROACH HEMI HIP ARTHROPLASTY Right 06/20/2017  Procedure: ANTERIOR APPROACH HEMI HIP ARTHROPLASTY;  Surgeon: Rod Can, MD;  Location: [facility];  Service: Orthopedics;  Laterality: Right;  FRACTURE SURGERY    SHOULDER HARDWARE REMOVAL Left   SHOULDER HEMI-ARTHROPLASTY Left   TOTAL HIP ARTHROPLASTY Right  HPI: Pt is an 85 year old female who presents with slurred speech and difficulty swallowing. MRI showed  a new posterior limb of the internal capsule stroke on the right as well as a small area of cortical stroke in the right frontal cortex. PMH includes: CAD, HTN, CVA (with expressive aphasia that improved almost back to baseline per granddaughter, no other SLP needs at that time)  Subjective: alert, soft speech, also slurred per family that is interpreting Assessment / Plan / Recommendation CHL IP CLINICAL IMPRESSIONS 01/26/2019 Clinical Impression Pt has a moderate oral and mild pharyngeal dysphagia with generalized fatigue that also likely contributes to aspiration risk. Her granddaughter was present for education as well as translation. She will only take very small bites/sips, resisting SLP if attempting to provide larger boluses, and she could not suck thickened liquid up via straw. She has  weak lingual manipulation that results in poor bolus cohesion, incomplete posterior transit that leaves oral residue, and premature spillage that reaches the pyriform sinuses before the swallow. Premature spillage with thin liquids spills directly into the airway before the swallow. Pt has a cough reaction (1-2 seconds delayed) but this does not move aspirated material. Nectar thick liquids and purees are better contained above the valleculae and do not enter the airway. Recommend starting Dys 1 diet and nectar thick liquids by cup or spoon. Suspect that pt will need smaller, more frequent offerings of food/liquid so that she does not fatigue across a meal, as she appeared to fatigue even during this study. Fatigue could further increase her risk of aspiration. Diet and precautions were reviewed with handouts provided. Will continue to follow acutely. SLP Visit Diagnosis Dysphagia, oropharyngeal phase (R13.12) Attention and concentration deficit following -- Frontal lobe and executive function deficit following -- Impact on safety and function Mild aspiration risk;Moderate aspiration risk;Risk for inadequate nutrition/hydration   CHL IP TREATMENT RECOMMENDATION 01/26/2019 Treatment Recommendations Therapy as outlined in treatment plan below   Prognosis 01/26/2019 Prognosis for Safe Diet Advancement Good Barriers to Reach Goals -- Barriers/Prognosis Comment -- CHL IP DIET RECOMMENDATION 01/26/2019 SLP Diet Recommendations Dysphagia 1 (Puree) solids;Nectar thick liquid Liquid Administration via Cup;Spoon;No straw Medication Administration Crushed with puree Compensations Slow rate;Small sips/bites Postural Changes Seated upright at 90 degrees   CHL IP OTHER RECOMMENDATIONS 01/26/2019 Recommended Consults -- Oral Care Recommendations Oral care BID Other Recommendations Order thickener from pharmacy;Prohibited food (jello, ice cream, thin soups);Remove water pitcher   CHL IP FOLLOW UP RECOMMENDATIONS 01/26/2019 Follow up  Recommendations Home health SLP;24 hour supervision/assistance   CHL IP FREQUENCY AND DURATION 01/26/2019 Speech Therapy Frequency (ACUTE ONLY) min 2x/week Treatment Duration 2 weeks      CHL IP ORAL PHASE 01/26/2019 Oral Phase Impaired Oral - Pudding Teaspoon -- Oral - Pudding Cup -- Oral - Honey Teaspoon -- Oral - Honey Cup -- Oral - Nectar Teaspoon Weak lingual manipulation;Reduced posterior propulsion;Lingual/palatal residue;Delayed oral transit;Decreased bolus cohesion;Premature spillage Oral - Nectar Cup Weak lingual manipulation;Reduced posterior propulsion;Lingual/palatal residue;Delayed oral transit;Decreased bolus cohesion;Premature spillage Oral - Nectar Straw -- Oral - Thin Teaspoon  Weak lingual manipulation;Reduced posterior propulsion;Lingual/palatal residue;Delayed oral transit;Decreased bolus cohesion;Premature spillage Oral - Thin Cup -- Oral - Thin Straw -- Oral - Puree Weak lingual manipulation;Reduced posterior propulsion;Lingual/palatal residue;Delayed oral transit;Decreased bolus cohesion;Premature spillage Oral - Mech Soft -- Oral - Regular -- Oral - Multi-Consistency -- Oral - Pill -- Oral Phase - Comment --  CHL IP PHARYNGEAL PHASE 01/26/2019 Pharyngeal Phase Impaired Pharyngeal- Pudding Teaspoon -- Pharyngeal -- Pharyngeal- Pudding Cup -- Pharyngeal -- Pharyngeal- Honey Teaspoon -- Pharyngeal -- Pharyngeal- Honey Cup -- Pharyngeal -- Pharyngeal- Nectar Teaspoon Reduced tongue base retraction Pharyngeal -- Pharyngeal- Nectar Cup Reduced tongue base retraction Pharyngeal -- Pharyngeal- Nectar Straw -- Pharyngeal -- Pharyngeal- Thin Teaspoon Reduced tongue base retraction;Penetration/Aspiration before swallow Pharyngeal Material enters airway, passes BELOW cords and not ejected out despite cough attempt by patient Pharyngeal- Thin Cup -- Pharyngeal -- Pharyngeal- Thin Straw -- Pharyngeal -- Pharyngeal- Puree Reduced tongue base retraction;Pharyngeal residue - valleculae Pharyngeal -- Pharyngeal-  Mechanical Soft -- Pharyngeal -- Pharyngeal- Regular -- Pharyngeal -- Pharyngeal- Multi-consistency -- Pharyngeal -- Pharyngeal- Pill -- Pharyngeal -- Pharyngeal Comment --  CHL IP CERVICAL ESOPHAGEAL PHASE 01/26/2019 Cervical Esophageal Phase WFL Pudding Teaspoon -- Pudding Cup -- Honey Teaspoon -- Honey Cup -- Nectar Teaspoon -- Nectar Cup -- Nectar Straw -- Thin Teaspoon -- Thin Cup -- Thin Straw -- Puree -- Mechanical Soft -- Regular -- Multi-consistency -- Pill -- Cervical Esophageal Comment -- Dannial Monarch 01/27/2019, 11:08 AM              Ct Head Code Stroke Wo Contrast  Result Date: 01/25/2019 CLINICAL DATA:  Code stroke. 85 year old female with slurred speech and right side weakness. EXAM: CT HEAD WITHOUT CONTRAST TECHNIQUE: Contiguous axial images were obtained from the base of the skull through the vertex without intravenous contrast. COMPARISON:  Head CT 08/18/2014. Brain MRI 03/09/2014. FINDINGS: Brain: Cerebral volume is not significantly changed. No ventriculomegaly. No acute intracranial hemorrhage identified. No midline shift, mass effect, or evidence of intracranial mass lesion. 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 Small areas of cortical and subcortical white matter hypodensity/encephalomalacia in the posterior left MCA territory on series 2, image 20 and 18 appear increased but not brand new. There is a similar chronic appearing much smaller right parietal cortical infarct best seen on sagittal image 8. No other cortically based infarct identified. Vascular: Tortuous. Calcified atherosclerosis at the skull base. No suspicious intracranial vascular hyperdensity. Skull: No acute osseous abnormality identified. Sinuses/Orbits: Visualized paranasal sinuses and mastoids are stable and well pneumatized. Other: Small nonspecific left forehead  scalp soft tissue polypoid lesion on series 3, image 52 is new since 2015 but probably benign. Interval postoperative changes to both globes. ASPECTS [facility] Stroke Program Early CT Score) - Ganglionic level infarction (caudate, lentiform nuclei, internal capsule, insula, M1-M3 cortex): - Supraganglionic infarction (M4-M6 cortex): Total score (0-10 with 10 being normal): IMPRESSION: 1. Advanced chronic ischemic disease with progression in both cerebral and cerebellar hemispheres since 2015. Several bilateral areas are age indeterminate. 2. No acute intracranial hemorrhage or mass effect. 3.  No suspicious intracranial vascular hyperdensity. Study discussed by telephone with Dr. Virgel Manifold on 01/25/2019 at 22:40 . Electronically Signed   By: Genevie Ann M.D.   On: 01/25/2019 22:41   Vas [place] Lower Extremity Venous (dvt)  Result Date: 01/28/2019  Lower Venous Study Indications: Embolic stroke.  Performing Technologist: Baldo Ash  Bynum RDMS, RVT  Examination Guidelines: A complete evaluation includes B-mode imaging, spectral Doppler, color Doppler, and power Doppler as needed of all accessible portions of each vessel. Bilateral testing is considered an integral part of a complete examination. Limited examinations for reoccurring indications may be performed as noted.  Right Venous Findings: +---------+---------------+---------+-----------+----------+-------------------+            Compressibility Phasicity Spontaneity Properties Summary              +---------+---------------+---------+-----------+----------+-------------------+  CFV       Full            Yes       Yes                                         +---------+---------------+---------+-----------+----------+-------------------+  SFJ       Full                                                                  +---------+---------------+---------+-----------+----------+-------------------+  FV Prox   Full                                                                   +---------+---------------+---------+-----------+----------+-------------------+  FV Mid    Full                                                                  +---------+---------------+---------+-----------+----------+-------------------+  FV Distal Full                                                                  +---------+---------------+---------+-----------+----------+-------------------+  PFV       Full                                                                  +---------+---------------+---------+-----------+----------+-------------------+  POP       Full            Yes       Yes                                         +---------+---------------+---------+-----------+----------+-------------------+  PTV       Full                                                                  +---------+---------------+---------+-----------+----------+-------------------+  PERO                                                       not well visualized  +---------+---------------+---------+-----------+----------+-------------------+  GSV       Full                                                                  +---------+---------------+---------+-----------+----------+-------------------+  Left Venous Findings: +---------+---------------+---------+-----------+----------+-------+            Compressibility Phasicity Spontaneity Properties Summary  +---------+---------------+---------+-----------+----------+-------+  CFV       Full            Yes       Yes                             +---------+---------------+---------+-----------+----------+-------+  SFJ       Full                                                      +---------+---------------+---------+-----------+----------+-------+  FV Prox   Full                                                      +---------+---------------+---------+-----------+----------+-------+  FV Mid    Full                                                       +---------+---------------+---------+-----------+----------+-------+  FV Distal Full                                                      +---------+---------------+---------+-----------+----------+-------+  PFV       Full                                                      +---------+---------------+---------+-----------+----------+-------+  POP       Full            Yes       Yes                             +---------+---------------+---------+-----------+----------+-------+  PTV       Full                                                      +---------+---------------+---------+-----------+----------+-------+  PERO      Full                                                      +---------+---------------+---------+-----------+----------+-------+  GSV       Full                                                      +---------+---------------+---------+-----------+----------+-------+    Summary: Right: There is no evidence of deep vein thrombosis in the lower extremity. However, portions of this examination were limited- see technologist comments above. No cystic structure found in the popliteal fossa. Left: There is no evidence of deep vein thrombosis in the lower extremity. No cystic structure found in the popliteal fossa.  *See table(s) above for measurements and observations. Electronically signed by Deitra Mayo MD on 01/28/2019 at 6:45:31 AM.    Final     Micro Results     No results found for this or any previous visit (from the past 240 hour(s)).     Today   Subjective:   [name] today has, No headache, No chest pain, No abdominal pain , no requirement of oxygen yesterday .  Objective:   Blood pressure (!) 137/98, pulse 76, temperature 98.1 F (36.7 C), temperature source Oral, resp. rate 16, height 4\' 8"  (1.422 m), weight 45.1 kg, SpO2 94 %.   Intake/Output Summary (Last 24 hours) at 01/29/2019 1212 Last data filed at 01/29/2019 0900 Gross per 24 hour  Intake 1074.1 ml    Output --  Net 1074.1 ml    Exam Awake Alert, pleasant, in recliner, eating breakfast with assistance of her granddaughter Symmetrical Chest wall movement, Good air movement bilaterally, CTAB RRR,No Gallops,Rubs or new Murmurs, No Parasternal Heave +ve B.Sounds, Abd Soft,  No rebound - guarding or rigidity. No Cyanosis, Clubbing or edema, No new Rash or bruise      Data Review   CBC w Diff:  Lab Results  Component Value Date   WBC 9.4 01/29/2019   HGB 15.0 01/29/2019   HCT 48.1 (H) 01/29/2019   PLT 894 (H) 01/29/2019   LYMPHOPCT 14 01/25/2019   MONOPCT 7 01/25/2019   EOSPCT 2 01/25/2019   BASOPCT 1 01/25/2019    CMP:  Lab Results  Component Value Date   NA 140 01/29/2019   K 4.0 01/29/2019   CL 111 01/29/2019   CO2 24 01/29/2019   BUN 24 (H) 01/29/2019   CREATININE 1.05 (H) 01/29/2019   PROT 7.5 01/25/2019   ALBUMIN 4.2 01/25/2019   BILITOT 0.8 01/25/2019   ALKPHOS 59 01/25/2019   AST 32 01/25/2019   ALT 15 01/25/2019  .   Total Time in preparing paper work, data evaluation and todays exam - 36 minutes  Phillips Climes M.D on 01/29/2019 at 12:12 PM  [facility]   Office  [phone]

## 2019-01-29 NOTE — Progress Notes (Signed)
Inpatient Rehab Admissions:  Inpatient Rehab Consult received.  I met with patient and her granddaughter at the bedside for rehabilitation assessment and to discuss goals and expectations of an inpatient rehab admission.  Pt sleeping and granddaughter asked not to wake her.  They are interested in an inpatient rehab admission.  I reviewed cost of care and provided them with information for financial counselor.  I have MD clearance and will plan for rehab admission today.  I contacted CM and CSW to let them know.    Note: pt for 1/2 unit blood phlebotomy today.  Spoke with Dr. Waldron Labs and pt's floor RN.  Will plan for this to occur before admission to CIR.    Signed: Shann Medal, PT, DPT Admissions Coordinator [phone] 01/29/19  12:56 PM

## 2019-01-29 NOTE — H&P (Signed)
Physical Medicine and Rehabilitation Admission H&P    Chief Complaint  Patient presents with  . Aphasia   Chief complaint: Patient is aphasic  HPI: Jill Castaneda is an 85 year old right handed non- English-speaking female with history of CAD, left midbrain and left thalamic infarct in 2015 with residual diplopia and right side weakness with tremor, hypertension, history of pulmonary emboli and hip replacement.per chart review patient lives with granddaughter it was a home health physical therapist and provide assistance as needed. Independent with assistive device prior to admission dresses and bathes herself. Presented 01/26/2019 with generalized weakness and slurred speech of acute onset. Cranial CT scan showed advanced chronic ischemic disease with progression of both cerebral cerebellar hemispheres since 2015. No acute change. Patient did not receive TPA.MRI showed infarction posterior limb internal capsule on the right extending into the caudate head. Small area of acute infarction right parietal cortex. Echocardiogram with ejection fraction of 93% normal systolic function. CT angiogram of head and neck negative for large vessel occlusion. Lower extremity Dopplers negative for DVT. Neurology consulted maintained on aspirin and Plavix 3 weeks then Plavix alone. Subcutaneous Lovenox for DVT prophylaxis.Recommendations for 30 day cardiac event monitor.Presently on a dysphagia #1 nectar liquid diet. Hematology consulted 01/28/2019 for thrombocytosis 1024/polycythemia and presently with conservative care follow-up outpatient. Plan to start Hydrea 500 mg daily as swallow improves. Therapy evaluation completed with recommendations of physical medicine rehabilitation consult. Patient was admitted for a compress of rehabilitation program.  Review of Systems  Unable to perform ROS: Language  Constitutional: Negative for fever.  HENT: Negative for hearing loss.   Eyes: Negative for double vision.   Cardiovascular: Negative for chest pain.  Gastrointestinal: Negative for nausea and vomiting.  Genitourinary: Negative for urgency.  Musculoskeletal: Negative for neck pain.  Skin: Negative for rash.  Neurological: Positive for weakness.  Psychiatric/Behavioral: Negative for depression.   Past Medical History:  Diagnosis Date  . Cataract   . Complication of anesthesia    Difficulty waking up "  . Coronary artery disease   . Hip fracture ([place]) 06/2017   right hip  . Hypertension   . Osteoporosis   . Stroke [facility])    Past Surgical History:  Procedure Laterality Date  . ANTERIOR APPROACH HEMI HIP ARTHROPLASTY Right 06/20/2017   Procedure: ANTERIOR APPROACH HEMI HIP ARTHROPLASTY;  Surgeon: Rod Can, MD;  Location: [place];  Service: Orthopedics;  Laterality: Right;  . FRACTURE SURGERY    . SHOULDER HARDWARE REMOVAL Left   . SHOULDER HEMI-ARTHROPLASTY Left   . TOTAL HIP ARTHROPLASTY Right    Family History  Problem Relation Age of Onset  . Hypertension Father   . Heart disease Father   . Hypertension Sister   . Heart disease Sister   . Stroke Mother   . Cancer Brother    Social History:  reports that she has never smoked. She has never used smokeless tobacco. She reports that she does not drink alcohol or use drugs. Allergies: No Known Allergies Medications Prior to Admission  Medication Sig Dispense Refill  . amLODipine (NORVASC) 2.5 MG tablet TAKE ONE TABLET BY MOUTH ONCE DAILY (Patient taking differently: TAKE 2.5 mg TABLET BY MOUTH ONCE DAILY) 90 tablet 0  . Ascorbic Acid (VITAMIN C) 1000 MG tablet Take 1,000 mg by mouth daily.    Marland Kitchen aspirin 81 MG chewable tablet Chew 1 tablet (81 mg total) by mouth 2 (two) times daily with a meal. (Patient taking differently: Chew 81 mg by mouth daily. )  60 tablet 1  . calcium-vitamin D (OSCAL WITH D) 500-200 MG-UNIT tablet Take 1 tablet by mouth 3 (three) times a week.    . cholecalciferol (VITAMIN D3) 25 MCG (1000 UT) tablet Take  1,000 Units by mouth daily.    . ferrous sulfate 325 (65 FE) MG tablet Take 325 mg by mouth daily with breakfast.    . Multiple Vitamin (MULTIVITAMIN WITH MINERALS) TABS tablet Take 2 tablets by mouth daily.     Marland Kitchen omega-3 acid ethyl esters (LOVAZA) 1 g capsule Take 1 g by mouth daily.      Drug Regimen Review Drug regimen was reviewed and remains appropriate with no significant issues identified  Home: Home Living Family/patient expects to be discharged to:: Private residence Living Arrangements: Other relatives(granddaughter) Available Help at Discharge: Family, Available 24 hours/day Type of Home: House Home Access: Stairs to enter [employer] of Steps: 4 Entrance Stairs-Rails: Right, Left(gdaughter plans to be on one side of her) Home Layout: Two level Alternate Level Stairs-Number of Steps: 13 steps Alternate Level Stairs-Rails: Left Bathroom Shower/Tub: Other (comment)(pt sponge bathes) Bathroom Toilet: Handicapped height Home Equipment: Environmental consultant - 2 wheels, Bedside commode, Toilet riser, Transport chair  Lives With: Family   Functional History: Prior Function Level of Independence: Independent with assistive device(s) Comments: ambulates with RW, dresses and bathes herself  Functional Status:  Mobility: Bed Mobility Overal bed mobility: Needs Assistance Bed Mobility: Supine to Sit, Sit to Supine Supine to sit: Mod assist Sit to supine: Min assist General bed mobility comments: increased time and effort, assistance needed for bilateral LE movement off of bed and for trunk elevation; min A to return L LE onto bed Transfers Overall transfer level: Needs assistance Equipment used: Rolling walker (2 wheeled) Transfers: Sit to/from Stand Sit to Stand: Min assist, +2 safety/equipment, +2 physical assistance General transfer comment: assistance for stability with transition into standing from sitting EOB; verbal and tactile cueing for safe hand placement  Ambulation/Gait Ambulation/Gait assistance: Min assist Gait Distance (Feet): 30 Feet Assistive device: Rolling walker (2 wheeled) Gait Pattern/deviations: Decreased step length - left, Decreased stance time - right, Decreased stride length, Decreased dorsiflexion - left, Decreased weight shift to left, Trunk flexed, Step-to pattern, Step-through pattern General Gait Details: pt with intermittent step through on L LE but decreased with fatigue and required verbal and tactile cueing; pt required constant min A for stability, safety with navigation around obstacles and RW management Gait velocity: decreased    ADL: ADL Overall ADL's : Needs assistance/impaired Eating/Feeding: Moderate assistance, Sitting(LUE poor coordination) Grooming: Minimal assistance, Wash/dry hands, Wash/dry face, Sitting Upper Body Bathing: Minimal assistance, Standing Lower Body Bathing: Maximal assistance, Sit to/from stand Upper Body Dressing : Minimal assistance, Standing Lower Body Dressing: Maximal assistance, Sitting/lateral leans, Sit to/from stand Toilet Transfer: Minimal assistance, BSC, Ambulation Toileting- Clothing Manipulation and Hygiene: Moderate assistance, Sitting/lateral lean, Sit to/from stand Functional mobility during ADLs: Minimal assistance, +2 for physical assistance, Rolling walker, Cueing for safety, Cueing for sequencing General ADL Comments: Pt requiring additional assist for ADL as pt is very weak in LUE/LLE and RUE with tremors. Pt with low vision- diplopia- already tried glasses taping and pt would not wear them.  Cognition: Cognition Overall Cognitive Status: Within Functional Limits for tasks assessed Orientation Level: Oriented to person, Disoriented to situation, Disoriented to time, Disoriented to place Cognition Arousal/Alertness: Awake/alert Behavior During Therapy: WFL for tasks assessed/performed Overall Cognitive Status: Within Functional Limits for tasks assessed   Physical Exam: Blood pressure (!) 137/98,  pulse 76, temperature 98.1 F (36.7 C), temperature source Oral, resp. rate 16, height 4\' 8"  (1.422 m), weight 45.1 kg, SpO2 94 %. Physical Exam  Constitutional: She is oriented to person, place, and time. No distress.  Frail appearing  HENT:  Head: Normocephalic and atraumatic.  edentulous  Eyes: Pupils are equal, round, and reactive to light. EOM are normal.  Neck: Normal range of motion. No tracheal deviation present. No thyromegaly present.  Cardiovascular: Normal rate and regular rhythm. Exam reveals gallop.  Respiratory: Effort normal. No respiratory distress. She has no wheezes. She has no rales.  GI: Soft. She exhibits no distension. There is no abdominal tenderness.  Neurological: She is alert and oriented to person, place, and time.  granddaughter translated. Pt followed all basic commands. Reasonable insight and awareness. RUE 4/5. LUE 3+ to 4-/5. RLE 4/5. LLE 3 to 4/5 prox to distal. Senses pain and LT in all 4's.   Skin: Skin is warm and dry.  Psychiatric: She has a normal mood and affect. Her behavior is normal. Judgment and thought content normal.    Results for orders placed or performed during the hospital encounter of 01/25/19 (from the past 48 hour(s))  CBC     Status: Abnormal   Collection Time: 01/28/19  4:41 AM  Result Value Ref Range   WBC 9.1 4.0 - 10.5 K/uL   RBC 5.73 (H) 3.87 - 5.11 MIL/uL   Hemoglobin 15.5 (H) 12.0 - 15.0 g/dL   HCT 49.5 (H) 36.0 - 46.0 %   MCV 86.4 80.0 - 100.0 fL   MCH 27.1 26.0 - 34.0 pg   MCHC 31.3 30.0 - 36.0 g/dL   RDW 16.3 (H) 11.5 - 15.5 %   Platelets 928 (HH) 150 - 400 K/uL    Comment: REPEATED TO VERIFY THIS CRITICAL RESULT HAS VERIFIED AND BEEN CALLED TO JAMIE STEELMAN,RN BY ZELDA BEECH ON 03 29 2020 AT 0541, AND HAS BEEN READ BACK.     nRBC 0.0 0.0 - 0.2 %    Comment: Performed at [facility], [place] [address] [place], [place] [address]  Basic metabolic panel     Status:  Abnormal   Collection Time: 01/28/19  4:41 AM  Result Value Ref Range   Sodium 140 135 - 145 mmol/L   Potassium 3.7 3.5 - 5.1 mmol/L   Chloride 109 98 - 111 mmol/L   CO2 24 22 - 32 mmol/L   Glucose, Bld 98 70 - 99 mg/dL   BUN 23 8 - 23 mg/dL   Creatinine, Ser 1.20 (H) 0.44 - 1.00 mg/dL   Calcium 8.9 8.9 - 10.3 mg/dL   GFR calc non Af Amer 40 (L) >60 mL/min   GFR calc Af Amer 46 (L) >60 mL/min   Anion gap 7 5 - 15    Comment: Performed at [facility], [place] [address] [place], [place] [address]  Save Smear     Status: None   Collection Time: 01/28/19  4:41 AM  Result Value Ref Range   Smear Review SMEAR STAINED AND AVAILABLE FOR REVIEW     Comment: Performed at [facility] [place] [address], [place], [place] [address]  Technologist smear review     Status: None   Collection Time: 01/28/19  4:41 AM  Result Value Ref Range   Tech Review MORPHOLOGY UNREMARKABLE     Comment: THROMBOCYTOSIS Performed at [facility], [place] [address], [place], [place] [address]   CBC  Status: Abnormal   Collection Time: 01/29/19  7:36 AM  Result Value Ref Range   WBC 9.4 4.0 - 10.5 K/uL   RBC 5.54 (H) 3.87 - 5.11 MIL/uL   Hemoglobin 15.0 12.0 - 15.0 g/dL   HCT 48.1 (H) 36.0 - 46.0 %   MCV 86.8 80.0 - 100.0 fL   MCH 27.1 26.0 - 34.0 pg   MCHC 31.2 30.0 - 36.0 g/dL   RDW 16.1 (H) 11.5 - 15.5 %   Platelets 894 (H) 150 - 400 K/uL   nRBC 0.0 0.0 - 0.2 %    Comment: Performed at [facility] [address] [place], [place] [address]  Basic metabolic panel     Status: Abnormal   Collection Time: 01/29/19  7:36 AM  Result Value Ref Range   Sodium 140 135 - 145 mmol/L   Potassium 4.0 3.5 - 5.1 mmol/L   Chloride 111 98 - 111 mmol/L   CO2 24 22 - 32 mmol/L   Glucose, Bld 95 70 - 99 mg/dL   BUN 24 (H) 8 - 23 mg/dL   Creatinine, Ser 1.05 (H) 0.44 - 1.00 mg/dL   Calcium 8.5 (L) 8.9 - 10.3 mg/dL   GFR calc non Af Amer 47 (L) >60 mL/min   GFR calc Af Amer 55 (L) >60 mL/min    Anion gap 5 5 - 15    Comment: Performed at [facility] [address], [place], [place] [address]   Dg Chest Port 1 View  Result Date: 01/27/2019 CLINICAL DATA:  Shortness of breath. EXAM: PORTABLE CHEST 1 VIEW COMPARISON:  Frontal and lateral views yesterday. FINDINGS: The cardiomediastinal contours are normal for AP technique. The lungs are clear. Pulmonary vasculature is normal. No consolidation, pleural effusion, or pneumothorax. No acute osseous abnormalities are seen. Unchanged cortical thickening in the left mid humerus likely at the deltoid insertion. Scoliotic curvature in the spine. IMPRESSION: No acute chest findings. Electronically Signed   By: Keith Rake M.D.   On: 01/27/2019 21:34   Vas [place] Lower Extremity Venous (dvt)  Result Date: 01/28/2019  Lower Venous Study Indications: Embolic stroke.  Performing Technologist: Antonieta Pert RDMS, RVT  Examination Guidelines: A complete evaluation includes B-mode imaging, spectral Doppler, color Doppler, and power Doppler as needed of all accessible portions of each vessel. Bilateral testing is considered an integral part of a complete examination. Limited examinations for reoccurring indications may be performed as noted.  Right Venous Findings: +---------+---------------+---------+-----------+----------+-------------------+          CompressibilityPhasicitySpontaneityPropertiesSummary             +---------+---------------+---------+-----------+----------+-------------------+ CFV      Full           Yes      Yes                                      +---------+---------------+---------+-----------+----------+-------------------+ SFJ      Full                                                             +---------+---------------+---------+-----------+----------+-------------------+ FV Prox  Full                                                              +---------+---------------+---------+-----------+----------+-------------------+  FV Mid   Full                                                             +---------+---------------+---------+-----------+----------+-------------------+ FV DistalFull                                                             +---------+---------------+---------+-----------+----------+-------------------+ PFV      Full                                                             +---------+---------------+---------+-----------+----------+-------------------+ POP      Full           Yes      Yes                                      +---------+---------------+---------+-----------+----------+-------------------+ PTV      Full                                                             +---------+---------------+---------+-----------+----------+-------------------+ PERO                                                  not well visualized +---------+---------------+---------+-----------+----------+-------------------+ GSV      Full                                                             +---------+---------------+---------+-----------+----------+-------------------+  Left Venous Findings: +---------+---------------+---------+-----------+----------+-------+          CompressibilityPhasicitySpontaneityPropertiesSummary +---------+---------------+---------+-----------+----------+-------+ CFV      Full           Yes      Yes                          +---------+---------------+---------+-----------+----------+-------+ SFJ      Full                                                 +---------+---------------+---------+-----------+----------+-------+ FV Prox  Full                                                 +---------+---------------+---------+-----------+----------+-------+  FV Mid   Full                                                  +---------+---------------+---------+-----------+----------+-------+ FV DistalFull                                                 +---------+---------------+---------+-----------+----------+-------+ PFV      Full                                                 +---------+---------------+---------+-----------+----------+-------+ POP      Full           Yes      Yes                          +---------+---------------+---------+-----------+----------+-------+ PTV      Full                                                 +---------+---------------+---------+-----------+----------+-------+ PERO     Full                                                 +---------+---------------+---------+-----------+----------+-------+ GSV      Full                                                 +---------+---------------+---------+-----------+----------+-------+    Summary: Right: There is no evidence of deep vein thrombosis in the lower extremity. However, portions of this examination were limited- see technologist comments above. No cystic structure found in the popliteal fossa. Left: There is no evidence of deep vein thrombosis in the lower extremity. No cystic structure found in the popliteal fossa.  *See table(s) above for measurements and observations. Electronically signed by Deitra Mayo MD on 01/28/2019 at 6:45:31 AM.    Final        Medical Problem List and Plan: 1.  Left side weakness with dysarthria/dysphagia secondary to acute infarct in the right posterior limb of internal capsule, caudate body, and right parietal cortex. Plan 30 day event monitor on outpatient. Aspirin Plavix 3 weeks then Plavix alone  -admit to inpatient rehab 2.  Antithrombotics: -DVT/anticoagulation:  Subcutaneous Lovenox  -antiplatelet therapy: Aspirin 81 mg daily, Plavix 75 mg daily 3. Pain Management: Tylenol as needed 4. Mood: Provide emotional support  -antipsychotic agents: N/A 5.  Neuropsych: This patient is capable of making decisions on her own behalf. 6. Skin/Wound Care:  Routine skin checks 7. Fluids/Electrolytes/Nutrition:  Routine in and out's with follow-up chemistries 8. Dysphagia. Dysphagia #1 nectar liquids. Follow-up speech therapy 9. Hyperlipidemia. Lipitor 10. 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 Her normal platelet count is between 800 and 100k.  - Plan to begin Hydrea 500 mg daily once swallow function improves  -pt received 1/2 unit blood phlebotomy prior to discharge to rehab today 11. History of hypertension. Patient on Norvasc 2.5 mg daily prior to admission. Resume as needed     Cathlyn Parsons, PA-C 01/29/2019

## 2019-01-29 NOTE — TOC Transition Note (Signed)
Transition of Care [facility]) - CM/SW Discharge Note   Patient Details  Name: Jill Castaneda MRN: [id number] Date of Birth: [birth year]  Transition of Care [facility] CM/SW Contact:  Pollie Friar, RN Phone Number: 01/29/2019, 12:14 PM   Clinical Narrative:    Pt discharging to CIR today.   Final next level of care: IP Rehab Facility Barriers to Discharge: No Barriers Identified   Patient Goals and CMS Choice        Discharge Placement                       Discharge Plan and Services   Discharge Planning Services: CM Consult                      Social Determinants of Health (SDOH) Interventions     Readmission Risk Interventions No flowsheet data found.

## 2019-01-29 NOTE — Progress Notes (Signed)
Physical Therapy Treatment Patient Details Name: Jill Castaneda MRN: [id number] DOB: Jun 07, 1928 Today's Date: 01/29/2019    History of Present Illness Pt is an 85 y/o female admitted secondary to slurred speech. MRI of the brain revealed an acute infarct in the posterior limb of the internal capsule. PMH including but not limited to coronary artery disease, hypertension, left midbrain and left thalamic stroke in 2015 with some residual diplopia and right-sided tremor.    PT Comments    Pt progressing well. Pt with significantly improved ambulation distance. Pt cont to demonstrate L sided weakness requiring assist to advance L LE during ambulation, pt also requires assist for walker management as she vears to the L due to R side being stronger. Granddaughter present to translate for patient as she is [place] speaking only. Acute PT to cont to follow.    Follow Up Recommendations  CIR     Equipment Recommendations  None recommended by PT    Recommendations for Other Services Rehab consult     Precautions / Restrictions Precautions Precautions: Fall Restrictions Weight Bearing Restrictions: No    Mobility  Bed Mobility Overal bed mobility: Needs Assistance Bed Mobility: Sit to Supine       Sit to supine: Min assist   General bed mobility comments: pt able to bring both LEs back in the bed but required minA to scoot up in the bed and position in midline  Transfers Overall transfer level: Needs assistance Equipment used: Rolling walker (2 wheeled) Transfers: Sit to/from Stand Sit to Stand: Min guard         General transfer comment: verbal cues for safe hand placement, pt able to power up, min guard to steady during transition of hands from chair to RW  Ambulation/Gait Ambulation/Gait assistance: Min assist Gait Distance (Feet): 140 Feet Assistive device: Rolling walker (2 wheeled) Gait Pattern/deviations: Step-to pattern;Decreased step length - left;Decreased weight  shift to left;Drifts right/left Gait velocity: slow Gait velocity interpretation: <1.31 ft/sec, indicative of household ambulator General Gait Details: pt with tendency to vear to the L with RW, pt with decreased L LE foot clearance, worsening with onset of fatigue, pt required no rest breaks but did requiring minA to clear L foot during swing phase for 3/4 of ambulation distance   Stairs             Wheelchair Mobility    Modified Rankin (Stroke Patients Only) Modified Rankin (Stroke Patients Only) Pre-Morbid Rankin Score: Moderate disability Modified Rankin: Moderately severe disability     Balance Overall balance assessment: Needs assistance Sitting-balance support: Feet supported Sitting balance-Leahy Scale: Fair     Standing balance support: Bilateral upper extremity supported Standing balance-Leahy Scale: Poor                              Cognition Arousal/Alertness: Awake/alert Behavior During Therapy: WFL for tasks assessed/performed Overall Cognitive Status: Within Functional Limits for tasks assessed                                        Exercises      General Comments General comments (skin integrity, edema, etc.): Pt's granddaughter present and able to translate for staff, she is also HHPT, pt assisted to [facility], granddaughter provided assist for hygiene, minA for std pvt transfer to [facility] with RW      Pertinent Vitals/Pain  Pain Assessment: No/denies pain    Home Living                      Prior Function            PT Goals (current goals can now be found in the care plan section) Acute Rehab PT Goals Patient Stated Goal: go to rehab Progress towards PT goals: Progressing toward goals    Frequency    Min 4X/week      PT Plan Current plan remains appropriate    Co-evaluation              AM-PAC PT "6 Clicks" Mobility   Outcome Measure  Help needed turning from your back to your side while in  a flat bed without using bedrails?: A Little Help needed moving from lying on your back to sitting on the side of a flat bed without using bedrails?: A Little Help needed moving to and from a bed to a chair (including a wheelchair)?: A Little Help needed standing up from a chair using your arms (e.g., wheelchair or bedside chair)?: A Little Help needed to walk in hospital room?: A Lot Help needed climbing 3-5 steps with a railing? : Total 6 Click Score: 15    End of Session Equipment Utilized During Treatment: Gait belt Activity Tolerance: Patient tolerated treatment well Patient left: in bed;with call bell/phone within reach;with family/visitor present Nurse Communication: Mobility status PT Visit Diagnosis: Muscle weakness (generalized) (M62.81)     Time: 1610-9604 PT Time Calculation (min) (ACUTE ONLY): 35 min  Charges:  $Gait Training: 8-22 mins $Therapeutic Activity: 8-22 mins                     Kittie Plater, PT, DPT Acute Rehabilitation Services Pager #: [phone] Office #: [phone]    Berline Lopes 01/29/2019, 1:51 PM

## 2019-01-29 NOTE — Progress Notes (Signed)
Meredith Staggers, MD  Physician  Physical Medicine and Rehabilitation  PMR Pre-admission  Signed  Date of Service:  01/29/2019 12:45 PM       Related encounter: ED to Hosp-Admission (Current) from 01/25/2019 in [facility]      Signed         Show:Clear all _0 Manual_1 Template_2 Copied  Added by: _3 Meredith Staggers, MD_4 Michel Santee, PT  _5 Hover for details PMR Admission Coordinator Pre-Admission Assessment  Patient: Jill Castaneda is an 85 y.o., female MRN: [id number] DOB: December 13, 1927 Height: _6  (142.2 cm) Weight: 45.1 kg  Insurance Information HMO:     PPO:      PCP:      IPA:      80/20:      OTHER:  PRIMARY: Uninsured--Medicaid Potential      Policy#:       Subscriber:  CM Name:       Phone#:      Fax#:  Pre-Cert#:       Employer:  Benefits:  Phone #:      Name:  Eff. Date:      Deduct:       Out of Pocket Max:       Life Max:  CIR:       SNF:  Outpatient:      Co-Pay:  Home Health:       Co-Pay:  DME:      Co-Pay:  Providers:  SECONDARY:       Policy#:       Subscriber:  CM Name:       Phone#:      Fax#:  Pre-Cert#:       Employer:  Benefits:  Phone #:      Name:  Eff. Date:      Deduct:       Out of Pocket Max:       Life Max:  CIR:       SNF:  Outpatient:      Co-Pay:  Home Health:       Co-Pay:  DME:      Co-Pay:   Medicaid Application Date:       Case Manager:  Disability Application Date:       Case Worker:    Emergency Contact Information         Contact Information    Name Relation Home Work [place] Granddaughter [phone]  [phone]      Current Medical History  Patient Admitting Diagnosis: R CVA posterior limb internal capsule  History of Present Illness: Jill Castaneda an 85 year old right handednon-English-speaking female with history of CAD, left midbrain and left thalamic infarct in 2015 with residual diplopia and right side weakness with tremor, hypertension, history of  pulmonary emboli and hip replacement. Presented 01/26/2019 with generalized weakness and slurred speech of acute onset. Cranial CT scan showed advanced chronic ischemic disease with progression of both cerebral cerebellar hemispheres since 2015. No acute change. Patient did not receive TPA.  MRI showed infarction posterior limb internal capsule on the right extending into the caudate head. Small area of acute infarction right parietal cortex. Echocardiogram with ejection fraction of 09% normal systolic function. CT angiogram of head and neck negative for large vessel occlusion. Lower extremity Dopplers negative for DVT. Neurology consulted maintained on aspirin and Plavix 3 weeks then Plavix alone. Subcutaneous Lovenox for DVT prophylaxis. Recommendations for 30 day cardiac event monitor.  Presently on a dysphagia #1 nectar liquid diet.  Hematology consulted 01/28/2019 for thrombocytosis 1024/polycythemia and presently with conservative care follow-up outpatient. Plan to start Hydrea 500 mg daily as swallow improves.    Complete NIHSS TOTAL: 10  Patient's medical record from [facility] has been reviewed by the rehabilitation admission coordinator and physician.  Past Medical History      Past Medical History:  Diagnosis Date  . Cataract   . Complication of anesthesia    Difficulty waking up "  . Coronary artery disease   . Hip fracture ([place]) 06/2017   right hip  . Hypertension   . Osteoporosis   . Stroke [facility])     Family History   family history includes Cancer in her brother; Heart disease in her father and sister; Hypertension in her father and sister; Stroke in her mother.  Prior Rehab/Hospitalizations Has the patient had prior rehab or hospitalizations prior to admission? No  Has the patient had major surgery during 100 days prior to admission? No              Current Medications  Current Facility-Administered Medications:  .  0.9 %  sodium chloride  infusion, , Intravenous, Continuous, Elgergawy, Silver Huguenin, MD, Last Rate: 50 mL/hr at 01/29/19 0500 .  acetaminophen (TYLENOL) tablet 650 mg, 650 mg, Oral, Q4H PRN **OR** acetaminophen (TYLENOL) solution 650 mg, 650 mg, Per Tube, Q4H PRN **OR** acetaminophen (TYLENOL) suppository 650 mg, 650 mg, Rectal, Q4H PRN, Opyd, Timothy S, MD .  aspirin EC tablet 81 mg, 81 mg, Oral, Daily, Rosalin Hawking, MD, 81 mg at 01/29/19 1113 .  atorvastatin (LIPITOR) tablet 40 mg, 40 mg, Oral, q1800, Rosalin Hawking, MD, 40 mg at 01/28/19 1840 .  clopidogrel (PLAVIX) tablet 75 mg, 75 mg, Oral, Daily, Rosalin Hawking, MD, 75 mg at 01/29/19 1113 .  enoxaparin (LOVENOX) injection 30 mg, 30 mg, Subcutaneous, Q24H, Opyd, Ilene Qua, MD, 30 mg at 01/29/19 1108 .  labetalol (NORMODYNE,TRANDATE) injection 5 mg, 5 mg, Intravenous, Q2H PRN, Opyd, Timothy S, MD .  pantoprazole (PROTONIX) EC tablet 40 mg, 40 mg, Oral, Daily, Elgergawy, Silver Huguenin, MD .  Resource ThickenUp Clear, , Oral, PRN, Eliseo Squires, Jessica U, DO .  senna-docusate (Senokot-S) tablet 1 tablet, 1 tablet, Oral, QHS PRN, Opyd, Ilene Qua, MD  Patients Current Diet:     Diet Order                  DIET - DYS 1 Room service appropriate? Yes with Assist; Fluid consistency: Nectar Thick  Diet effective now               Precautions / Restrictions Precautions Precautions: Fall Restrictions Weight Bearing Restrictions: No   Has the patient had 2 or more falls or a fall with injury in the past year? No  Prior Activity Level Community (5-7x/wk): Was walking 1 mile/day with her grand daughter, occasional hand held assist.  Prior Functional Level Self Care: Did the patient need help bathing, dressing, using the toilet or eating? Independent mostly.  Granddaughter reports pt did have supervision with bathing.  Indoor Mobility: Did the patient need assistance with walking from room to room (with or without device)? Independent  Stairs: Did the patient need  assistance with internal or external stairs (with or without device)? Independent  Functional Cognition: Did the patient need help planning regular tasks such as shopping or remembering to take medications? Independent  Home Assistive Devices / Equipment Home Assistive Devices/Equipment: None Home Equipment: Walker - 2 wheels, Bedside commode, Toilet  riser, Transport chair  Prior Device Use: Indicate devices/aids used by the patient prior to current illness, exacerbation or injury? None of the above  Current Functional Level Cognition  Overall Cognitive Status: Within Functional Limits for tasks assessed Orientation Level: Oriented to person, Disoriented to situation, Disoriented to time, Disoriented to place    Extremity Assessment (includes Sensation/Coordination)  Upper Extremity Assessment: Generalized weakness, RUE deficits/detail, LUE deficits/detail RUE Deficits / Details: tremor RUE Sensation: WNL RUE Coordination: decreased fine motor LUE Deficits / Details: decreased coordination LUE Sensation: WNL LUE Coordination: WNL  Lower Extremity Assessment: Generalized weakness, Defer to PT evaluation, LLE deficits/detail LLE Deficits / Details: LLE drags on floor, but WFLs for strength testing    ADLs  Overall ADL's : Needs assistance/impaired Eating/Feeding: Moderate assistance, Sitting(LUE poor coordination) Grooming: Minimal assistance, Wash/dry hands, Wash/dry face, Sitting Upper Body Bathing: Minimal assistance, Standing Lower Body Bathing: Maximal assistance, Sit to/from stand Upper Body Dressing : Minimal assistance, Standing Lower Body Dressing: Maximal assistance, Sitting/lateral leans, Sit to/from stand Toilet Transfer: Minimal assistance, BSC, Ambulation Toileting- Clothing Manipulation and Hygiene: Moderate assistance, Sitting/lateral lean, Sit to/from stand Functional mobility during ADLs: Minimal assistance, +2 for physical assistance, Rolling walker,  Cueing for safety, Cueing for sequencing General ADL Comments: Pt requiring additional assist for ADL as pt is very weak in LUE/LLE and RUE with tremors. Pt with low vision- diplopia- already tried glasses taping and pt would not wear them.    Mobility  Overal bed mobility: Needs Assistance Bed Mobility: Supine to Sit, Sit to Supine Supine to sit: Mod assist Sit to supine: Min assist General bed mobility comments: increased time and effort, assistance needed for bilateral LE movement off of bed and for trunk elevation; min A to return L LE onto bed    Transfers  Overall transfer level: Needs assistance Equipment used: Rolling walker (2 wheeled) Transfers: Sit to/from Stand Sit to Stand: Min assist, +2 safety/equipment, +2 physical assistance General transfer comment: assistance for stability with transition into standing from sitting EOB; verbal and tactile cueing for safe hand placement    Ambulation / Gait / Stairs / Wheelchair Mobility  Ambulation/Gait Ambulation/Gait assistance: Herbalist (Feet): 30 Feet Assistive device: Rolling walker (2 wheeled) Gait Pattern/deviations: Decreased step length - left, Decreased stance time - right, Decreased stride length, Decreased dorsiflexion - left, Decreased weight shift to left, Trunk flexed, Step-to pattern, Step-through pattern General Gait Details: pt with intermittent step through on L LE but decreased with fatigue and required verbal and tactile cueing; pt required constant min A for stability, safety with navigation around obstacles and RW management Gait velocity: decreased    Posture / Balance Balance Overall balance assessment: Needs assistance Sitting-balance support: Feet supported Sitting balance-Leahy Scale: Fair Standing balance support: Bilateral upper extremity supported Standing balance-Leahy Scale: Poor    Special needs/care consideration BiPAP/CPAP no CPM no Continuous Drip IV 0.9% sodium  chloride infusion at 50 mL/hr Dialysis no        Days n/a Life Vest no Oxygen no Special Bed no Trach Size no Wound Vac (area) no      Location n/a Skin intact                              Bowel mgmt: incontinent, last BM 01/28/2019 Bladder mgmt: incontinent Diabetic mgmt: no Behavioral consideration no Chemo/radiation no Interpreter: pt's granddaughter interpreting for patient as her speech is very slurred and very soft.  She has stayed this whole admission, in patient's room, and has not traveled to/from the hospital.  This was agreed upon on acute care.     Previous Home Environment (from acute therapy documentation) Living Arrangements: Other relatives(granddaughter)  Lives With: Family Available Help at Discharge: Family, Available 24 hours/day Type of Home: House Home Layout: Two level Alternate Level Stairs-Rails: Left Alternate Level Stairs-Number of Steps: 13 steps Home Access: Stairs to enter Entrance Stairs-Rails: Right, Left(gdaughter plans to be on one side of her) Entrance Stairs-Number of Steps: 4 Bathroom Shower/Tub: Other (comment)(pt sponge bathes) Bathroom Toilet: Handicapped height Home Care Services: No  Discharge Living Setting Plans for Discharge Living Setting: Patient's home Type of Home at Discharge: Other (Comment)(townhome) Discharge Home Layout: Two level, Able to live on main level with bedroom/bathroom Alternate Level Stairs-Rails: Left Alternate Level Stairs-Number of Steps: 13 Discharge Home Access: Stairs to enter Entrance Stairs-Rails: Right, Left Entrance Stairs-Number of Steps: 4 Discharge Bathroom Shower/Tub: Tub/shower unit Discharge Bathroom Toilet: Standard Discharge Bathroom Accessibility: Yes How Accessible: Accessible via walker Does the patient have any problems obtaining your medications?: No  Social/Family/Support Systems Anticipated Caregiver: granddaughter, Ana Anticipated Caregiver's Contact Information: [phone]  Ability/Limitations of Caregiver: none Caregiver Availability: 24/7(Ana works wkends in [employer], will arrange private caregiver ) Discharge Plan Discussed with Primary Caregiver: Yes Is Caregiver In Agreement with Plan?: Yes Does Caregiver/Family have Issues with Lodging/Transportation while Pt is in Rehab?: No  Goals/Additional Needs Patient/Family Goal for Rehab: PT/OT/SLP supervision Expected length of stay: 10-12 days Dietary Needs: D2/nectar Equipment Needs: tbd Special Service Needs: pt's granddaughter interprets for her.  Interpreters have not been able to understand pt due to slurred and soft speech Pt/Family Agrees to Admission and willing to participate: Yes Program Orientation Provided & Reviewed with Pt/Caregiver Including Roles  & Responsibilities: Yes  Possible need for SNF placement upon discharge: not anticipated  Patient Condition: I have reviewed medical records from [facility], spoken with CSW, CM, and granddaughter. I met with patient and granddaughter at the bedside and for inpatient rehabilitation assessment.  Patient will benefit from ongoing PT, OT, and SLP, can actively participate in 3 hours of therapy a day 5 days of the week, and can make measurable gains during the admission.  Patient will also benefit from the coordinated team approach during an Inpatient Acute Rehabilitation admission.  The patient will receive intensive therapy as well as Rehabilitation physician, nursing, social worker, and care management interventions.  Due to bowel management, bladder management, safety, skin/wound care, disease management, medical administration, pain management, and patient education the patient requires 24 hour a day rehabilitation nursing.  The patient is currently min to mod with mobility and basic ADLs.  Discharge setting and therapy post discharge at home with home health is anticipated.  Patient has agreed to participate in the Acute Inpatient Rehabilitation  Program and will admit today.  Preadmission Screen Completed By:  Michel Santee, 01/29/2019 12:45 PM ______________________________________________________________________   Discussed status with Dr. Naaman Plummer on 01/29/19 at 12:52 PM  and received approval for admission today.  Admission Coordinator:  Michel Santee, PT, DPT time 12:52 PM Sudie Grumbling 01/29/19    Assessment/Plan: Diagnosis: Right PLIC, caudate, parietal cortex infarct, thrombocytosis 1. Does the need for close, 24 hr/day Medical supervision in concert with the patient's rehab needs make it unreasonable for this patient to be served in a less intensive setting? Yes 2. Co-Morbidities requiring supervision/potential complications: CAD, prior CVA,  3. Due to bladder management, bowel management, safety,  skin/wound care, disease management, medication administration, pain management and patient education, does the patient require 24 hr/day rehab nursing? Yes 4. Does the patient require coordinated care of a physician, rehab nurse, PT (1-2 hrs/day, 5 days/week), OT (1-2 hrs/day, 5 days/week) and SLP (1-2 hrs/day, 5 days/week) to address physical and functional deficits in the context of the above medical diagnosis(es)? Yes Addressing deficits in the following areas: balance, endurance, locomotion, strength, transferring, bowel/bladder control, bathing, dressing, feeding, grooming, toileting, cognition, speech, swallowing and psychosocial support 5. Can the patient actively participate in an intensive therapy program of at least 3 hrs of therapy 5 days a week? Yes 6. The potential for patient to make measurable gains while on inpatient rehab is excellent 7. Anticipated functional outcomes upon discharge from inpatients are: supervision PT, supervision OT, supervision SLP 8. Estimated rehab length of stay to reach the above functional goals is: 10-12 days 9. Anticipated D/C setting: Home 10. Anticipated post D/C treatments: [place] therapy 11.  Overall Rehab/Functional Prognosis: good  MD Signature: Meredith Staggers, MD, [facility] 01/29/2019         Revision History

## 2019-01-29 NOTE — Progress Notes (Signed)
PROGRESS NOTE                                                                                                                                                                                                             Patient Demographics:    Jill Castaneda, is a 85 y.o. female, DOB - 07/21/1928, POE:[id number]  Admit date - 01/25/2019   Admitting Physician Vianne Bulls, MD  Outpatient Primary MD for the patient is Patient, No Pcp Per  LOS - 3   Chief Complaint  Patient presents with   Aphasia       Brief Narrative    85 y.o. female with medical history significant for coronary artery disease, multiple strokes with residual deficits, hypertension, and history of PE after a hip replacement, now presenting to emergency department for evaluation of generalized weakness and speech disturbance, her work-up significant for acute CVA, as well she noted to have polycythemia, thrombocythemia.   Subjective:    [name] today has, No headache, No chest pain, No abdominal pain , no requirement of oxygen yesterday .   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 - She is followed by SLP, on dysphagia 1 with thickened liquid diet . - CTA head and neck with no evidence of large vessel occlusion - 2 D echo with a preserved EF, and no evidence of embolic sourceat - Consult with PT/OT/SLP  - Continue with aspirin and Plavix, to continue for 3-week with dual antiplatelet therapy,  then Plavix alone - seen By PT, recommendation for CIR -Hemoglobin A1c at 5.8, LDL is 154 -Remains with significant dysphagia currently on dysphagia 2 with thick liquid  polycythemia/thrombocytosis -Discussed with the granddaughter, patient usually following with [facility] clinic, her platelet count usually between 800 K to 1000 K, elevated hemoglobin. -Hematology input greatly appreciated, is likely MPN, especially with essential thrombocythemia,  low suspicion for malignancy related secondary thrombocytosis, further work-up to be done as an outpatient. -Recommendation for half unit blood phlebotomy , performed today by IV team -Dysuria when able to swallow -Recommendation to continue aspirin and Plavix  Hyperipidemia -LDL is 154, currently on statin  Hypertension  - DBP in low 100's in ED  - Permissive HTN to 220/110 while evaluating for possible acute CVA    CAD  - No anginal complaints  - Continue ASA   AKI -Continue is 1.2 today, will start on IV fluids, she is likely dehydrated, especially on nectar thickened diet, as well she received IV contrast recently  Acute hypoxic respiratory failure -Point patient required 4 L nasal cannula, all has resolved with encouragement of using incentive spirometry, she is on room air today .    Code Status : DNR  Family Communication  : grandaughter at bedside  Disposition Plan  : CIR  Consults  :  neurolgoy  Procedures  : None  DVT Prophylaxis  :  [place] lovenox  Lab Results  Component Value Date   PLT 894 (H) 01/29/2019    Antibiotics  :    Anti-infectives (From admission, onward)   None        Objective:   Vitals:   01/28/19 2045 01/28/19 2350 01/29/19 0421 01/29/19 0844  BP: (!) 139/104 133/88 126/79 129/89  Pulse: (!) 103 85 (!) 53 78  Resp: 16 16 18 18   Temp: 98 F (36.7 C) 98.4 F (36.9 C) 98.1 F (36.7 C) (!) 97.5 F (36.4 C)  TempSrc: Oral Oral Oral Axillary  SpO2: (!) 87% (!) 87% 91% 93%  Weight:        Height:        Wt Readings from Last 3 Encounters:  01/26/19 45.1 kg  06/23/17 47.2 kg  06/19/17 42.2 kg     Intake/Output Summary (Last 24 hours) at 01/29/2019 1123 Last data filed at 01/29/2019 0900 Gross per 24 hour  Intake 1074.1 ml  Output --  Net 1074.1 ml     Physical Exam  Awake Alert, pleasant, in recliner, eating breakfast with assistance of her granddaughter Symmetrical Chest wall movement, Good air movement bilaterally, CTAB RRR,No Gallops,Rubs or new Murmurs, No Parasternal Heave +ve B.Sounds, Abd Soft,  No rebound - guarding or rigidity. No Cyanosis, Clubbing or edema, No new Rash or bruise        Data Review:    CBC Recent Labs  Lab 01/25/19 2245 01/28/19 0441 01/29/19 0736  WBC 8.5 9.1 9.4  HGB 16.7* 15.5* 15.0  HCT 53.6* 49.5* 48.1*  PLT 1,024* 928* 894*  MCV 87.9 86.4 86.8  MCH 27.4 27.1 27.1  MCHC 31.2 31.3 31.2  RDW 16.5* 16.3* 16.1*  LYMPHSABS 1.2  --   --   MONOABS 0.6  --   --   EOSABS 0.2  --   --   BASOSABS 0.1  --   --     Chemistries  Recent Labs  Lab 01/25/19 2245 01/25/19 2254 01/28/19 0441 01/29/19 0736  NA 139  --  140 140  K 4.1  --  3.7 4.0  CL 102  --  109 111  CO2 26  --  24 24  GLUCOSE 106*  --  98 95  BUN 19  --  23 24*  CREATININE 0.96 0.90 1.20* 1.05*  CALCIUM 9.8  --  8.9 8.5*  AST 32  --   --   --   ALT 15  --   --   --  ALKPHOS 59  --   --   --   BILITOT 0.8  --   --   --    ------------------------------------------------------------------------------------------------------------------ No results for input(s): CHOL, HDL, LDLCALC, TRIG, CHOLHDL, LDLDIRECT in the last 72 hours.  Lab Results  Component Value Date   HGBA1C 5.8 (H) 01/25/2019   ------------------------------------------------------------------------------------------------------------------ No results for input(s): TSH, T4TOTAL, T3FREE, THYROIDAB in the last 72 hours.  Invalid input(s):  FREET3 ------------------------------------------------------------------------------------------------------------------ No results for input(s): VITAMINB12, FOLATE, FERRITIN, TIBC, IRON, RETICCTPCT in the last 72 hours.  Coagulation profile Recent Labs  Lab 01/25/19 2245  INR 0.9    No results for input(s): DDIMER in the last 72 hours.  Cardiac Enzymes No results for input(s): CKMB, TROPONINI, MYOGLOBIN in the last 168 hours.  Invalid input(s): CK ------------------------------------------------------------------------------------------------------------------    Component Value Date/Time   BNP 144.1 (H) 06/23/2017 1741    Inpatient Medications  Scheduled Meds:  aspirin EC  81 mg Oral Daily   atorvastatin  40 mg Oral q1800   clopidogrel  75 mg Oral Daily   enoxaparin (LOVENOX) injection  30 mg Subcutaneous Q24H   Continuous Infusions:  sodium chloride 50 mL/hr at 01/29/19 0500   PRN Meds:.acetaminophen **OR** acetaminophen (TYLENOL) oral liquid 160 mg/5 mL **OR** acetaminophen, labetalol, Resource ThickenUp Clear, senna-docusate  Micro Results No results found for this or any previous visit (from the past 240 hour(s)).  Radiology Reports Ct Angio Head W Or Wo Contrast  Result Date: 01/26/2019 CLINICAL DATA:  Stroke EXAM: CT ANGIOGRAPHY HEAD AND NECK TECHNIQUE: Multidetector CT imaging of the head and neck was performed using the standard protocol during bolus administration of intravenous contrast. Multiplanar CT image reconstructions and MIPs were obtained to evaluate the vascular anatomy. Carotid stenosis measurements (when applicable) are obtained utilizing NASCET criteria, using the distal internal carotid diameter as the denominator. CONTRAST:  24mL OMNIPAQUE IOHEXOL 350 MG/ML SOLN COMPARISON:  MRI head 01/26/2019.  CT head 01/25/2019 FINDINGS: CTA NECK FINDINGS Aortic arch: Mild atherosclerotic disease aortic arch without aneurysm or dissection. Proximal great  vessels widely patent Right carotid system: Widely patent without atherosclerotic disease or dissection Left carotid system: Widely patent without atherosclerotic disease or dissection Vertebral arteries: Both vertebral arteries widely patent to the basilar Skeleton: Mild cervical spine degenerative change. No acute skeletal abnormality. Other neck: Negative Upper chest: Lung apices clear bilaterally. Review of the MIP images confirms the above findings CTA HEAD FINDINGS Anterior circulation: No significant stenosis, proximal occlusion, aneurysm, or vascular malformation. Posterior circulation: No significant stenosis, proximal occlusion, aneurysm, or vascular malformation. Venous sinuses: Not well evaluated due to arterial phase scanning Anatomic variants: None Delayed phase: No acute intracranial abnormality. Extensive chronic microvascular ischemia. Review of the MIP images confirms the above findings IMPRESSION: 1. Negative for large vessel occlusion. Acute infarcts on the right are consistent with small vessel occlusion. Extensive chronic microvascular ischemic change in the brain 2. No significant carotid or vertebral atherosclerotic disease in the neck. Electronically Signed   By: Franchot Gallo M.D.   On: 01/26/2019 13:07   Dg Chest 2 View  Result Date: 01/26/2019 CLINICAL DATA:  Altered mental status. EXAM: CHEST - 2 VIEW COMPARISON:  Chest radiograph 06/19/2017. CT 06/23/2017 FINDINGS: The patient is rotated. Heart size normal for technique. Aortic tortuosity. No pulmonary edema. No focal airspace disease. No pleural effusion or pneumothorax. Bones are under mineralized with scoliosis. IMPRESSION: Rotated exam without acute abnormality. Electronically Signed   By: Keith Rake M.D.   On: 01/26/2019 00:14  Ct Angio Neck W Or Wo Contrast  Result Date: 01/26/2019 CLINICAL DATA:  Stroke EXAM: CT ANGIOGRAPHY HEAD AND NECK TECHNIQUE: Multidetector CT imaging of the head and neck was performed using  the standard protocol during bolus administration of intravenous contrast. Multiplanar CT image reconstructions and MIPs were obtained to evaluate the vascular anatomy. Carotid stenosis measurements (when applicable) are obtained utilizing NASCET criteria, using the distal internal carotid diameter as the denominator. CONTRAST:  23mL OMNIPAQUE IOHEXOL 350 MG/ML SOLN COMPARISON:  MRI head 01/26/2019.  CT head 01/25/2019 FINDINGS: CTA NECK FINDINGS Aortic arch: Mild atherosclerotic disease aortic arch without aneurysm or dissection. Proximal great vessels widely patent Right carotid system: Widely patent without atherosclerotic disease or dissection Left carotid system: Widely patent without atherosclerotic disease or dissection Vertebral arteries: Both vertebral arteries widely patent to the basilar Skeleton: Mild cervical spine degenerative change. No acute skeletal abnormality. Other neck: Negative Upper chest: Lung apices clear bilaterally. Review of the MIP images confirms the above findings CTA HEAD FINDINGS Anterior circulation: No significant stenosis, proximal occlusion, aneurysm, or vascular malformation. Posterior circulation: No significant stenosis, proximal occlusion, aneurysm, or vascular malformation. Venous sinuses: Not well evaluated due to arterial phase scanning Anatomic variants: None Delayed phase: No acute intracranial abnormality. Extensive chronic microvascular ischemia. Review of the MIP images confirms the above findings IMPRESSION: 1. Negative for large vessel occlusion. Acute infarcts on the right are consistent with small vessel occlusion. Extensive chronic microvascular ischemic change in the brain 2. No significant carotid or vertebral atherosclerotic disease in the neck. Electronically Signed   By: Franchot Gallo M.D.   On: 01/26/2019 13:07   Mr Brain Wo Contrast  Result Date: 01/26/2019 CLINICAL DATA:  Dysarthria.  History of stroke. EXAM: MRI HEAD WITHOUT CONTRAST TECHNIQUE:  Multiplanar, multiecho pulse sequences of the brain and surrounding structures were obtained without intravenous contrast. COMPARISON:  CT head 01/25/2019.  MRI head 03/09/2014 FINDINGS: Brain: Acute infarct in the right basal ganglia involving the posterior limb internal capsule and caudate body. Small area of acute infarct in the right parietal cortex. Mild atrophy. Extensive chronic ischemic changes throughout the white matter. Chronic infarcts in the thalamus bilaterally. Small chronic infarcts in the cerebellum bilaterally. Chronic infarct in the left midbrain. Negative for hemorrhage mass or midline shift. Vascular: Normal arterial flow voids Skull and upper cervical spine: Negative Sinuses/Orbits: Mild mucosal edema paranasal sinuses. Bilateral cataract surgery Other: None IMPRESSION: Acute infarct in the posterior limb internal capsule on the right extending into the caudate body. Small area of acute infarct right parietal cortex Advanced chronic microvascular ischemia. Electronically Signed   By: Franchot Gallo M.D.   On: 01/26/2019 07:10   Dg Chest Port 1 View  Result Date: 01/27/2019 CLINICAL DATA:  Shortness of breath. EXAM: PORTABLE CHEST 1 VIEW COMPARISON:  Frontal and lateral views yesterday. FINDINGS: The cardiomediastinal contours are normal for AP technique. The lungs are clear. Pulmonary vasculature is normal. No consolidation, pleural effusion, or pneumothorax. No acute osseous abnormalities are seen. Unchanged cortical thickening in the left mid humerus likely at the deltoid insertion. Scoliotic curvature in the spine. IMPRESSION: No acute chest findings. Electronically Signed   By: Keith Rake M.D.   On: 01/27/2019 21:34   Dg Swallowing Func-speech Pathology  Result Date: 01/27/2019 Objective Swallowing Evaluation: Type of Study: MBS-Modified Barium Swallow Study  Patient Details Name: Corrinna Karapetyan MRN: [id number] Date of Birth: 1928-05-01 Today's Date: 01/27/2019 Time: SLP Start  Time (ACUTE ONLY): 1211 -SLP Stop Time (ACUTE ONLY): 4098  SLP Time Calculation (min) (ACUTE ONLY): 23 min Past Medical History: Past Medical History: Diagnosis Date  Cataract   Complication of anesthesia   Difficulty waking up "  Coronary artery disease   Hip fracture ([place]) 06/2017  right hip  Hypertension   Osteoporosis   Stroke [facility])  Past Surgical History: Past Surgical History: Procedure Laterality Date  ANTERIOR APPROACH HEMI HIP ARTHROPLASTY Right 06/20/2017  Procedure: ANTERIOR APPROACH HEMI HIP ARTHROPLASTY;  Surgeon: Rod Can, MD;  Location: [place];  Service: Orthopedics;  Laterality: Right;  FRACTURE SURGERY    SHOULDER HARDWARE REMOVAL Left   SHOULDER HEMI-ARTHROPLASTY Left   TOTAL HIP ARTHROPLASTY Right  HPI: Pt is an 85 year old female who presents with slurred speech and difficulty swallowing. MRI showed  a new posterior limb of the internal capsule stroke on the right as well as a small area of cortical stroke in the right frontal cortex. PMH includes: CAD, HTN, CVA (with expressive aphasia that improved almost back to baseline per granddaughter, no other SLP needs at that time)  Subjective: alert, soft speech, also slurred per family that is interpreting Assessment / Plan / Recommendation CHL IP CLINICAL IMPRESSIONS 01/26/2019 Clinical Impression Pt has a moderate oral and mild pharyngeal dysphagia with generalized fatigue that also likely contributes to aspiration risk. Her granddaughter was present for education as well as translation. She will only take very small bites/sips, resisting SLP if attempting to provide larger boluses, and she could not suck thickened liquid up via straw. She has weak lingual manipulation that results in poor bolus cohesion, incomplete posterior transit that leaves oral residue, and premature spillage that reaches the pyriform sinuses before the swallow. Premature spillage with thin liquids spills directly into the airway before the swallow. Pt has a  cough reaction (1-2 seconds delayed) but this does not move aspirated material. Nectar thick liquids and purees are better contained above the valleculae and do not enter the airway. Recommend starting Dys 1 diet and nectar thick liquids by cup or spoon. Suspect that pt will need smaller, more frequent offerings of food/liquid so that she does not fatigue across a meal, as she appeared to fatigue even during this study. Fatigue could further increase her risk of aspiration. Diet and precautions were reviewed with handouts provided. Will continue to follow acutely. SLP Visit Diagnosis Dysphagia, oropharyngeal phase (R13.12) Attention and concentration deficit following -- Frontal lobe and executive function deficit following -- Impact on safety and function Mild aspiration risk;Moderate aspiration risk;Risk for inadequate nutrition/hydration   CHL IP TREATMENT RECOMMENDATION 01/26/2019 Treatment Recommendations Therapy as outlined in treatment plan below   Prognosis 01/26/2019 Prognosis for Safe Diet Advancement Good Barriers to Reach Goals -- Barriers/Prognosis Comment -- CHL IP DIET RECOMMENDATION 01/26/2019 SLP Diet Recommendations Dysphagia 1 (Puree) solids;Nectar thick liquid Liquid Administration via Cup;Spoon;No straw Medication Administration Crushed with puree Compensations Slow rate;Small sips/bites Postural Changes Seated upright at 90 degrees   CHL IP OTHER RECOMMENDATIONS 01/26/2019 Recommended Consults -- Oral Care Recommendations Oral care BID Other Recommendations Order thickener from pharmacy;Prohibited food (jello, ice cream, thin soups);Remove water pitcher   CHL IP FOLLOW UP RECOMMENDATIONS 01/26/2019 Follow up Recommendations Home health SLP;24 hour supervision/assistance   CHL IP FREQUENCY AND DURATION 01/26/2019 Speech Therapy Frequency (ACUTE ONLY) min 2x/week Treatment Duration 2 weeks      CHL IP ORAL PHASE 01/26/2019 Oral Phase Impaired Oral - Pudding Teaspoon -- Oral - Pudding Cup -- Oral - Honey  Teaspoon -- Oral - Honey Cup -- Oral - Nectar  Teaspoon Weak lingual manipulation;Reduced posterior propulsion;Lingual/palatal residue;Delayed oral transit;Decreased bolus cohesion;Premature spillage Oral - Nectar Cup Weak lingual manipulation;Reduced posterior propulsion;Lingual/palatal residue;Delayed oral transit;Decreased bolus cohesion;Premature spillage Oral - Nectar Straw -- Oral - Thin Teaspoon Weak lingual manipulation;Reduced posterior propulsion;Lingual/palatal residue;Delayed oral transit;Decreased bolus cohesion;Premature spillage Oral - Thin Cup -- Oral - Thin Straw -- Oral - Puree Weak lingual manipulation;Reduced posterior propulsion;Lingual/palatal residue;Delayed oral transit;Decreased bolus cohesion;Premature spillage Oral - Mech Soft -- Oral - Regular -- Oral - Multi-Consistency -- Oral - Pill -- Oral Phase - Comment --  CHL IP PHARYNGEAL PHASE 01/26/2019 Pharyngeal Phase Impaired Pharyngeal- Pudding Teaspoon -- Pharyngeal -- Pharyngeal- Pudding Cup -- Pharyngeal -- Pharyngeal- Honey Teaspoon -- Pharyngeal -- Pharyngeal- Honey Cup -- Pharyngeal -- Pharyngeal- Nectar Teaspoon Reduced tongue base retraction Pharyngeal -- Pharyngeal- Nectar Cup Reduced tongue base retraction Pharyngeal -- Pharyngeal- Nectar Straw -- Pharyngeal -- Pharyngeal- Thin Teaspoon Reduced tongue base retraction;Penetration/Aspiration before swallow Pharyngeal Material enters airway, passes BELOW cords and not ejected out despite cough attempt by patient Pharyngeal- Thin Cup -- Pharyngeal -- Pharyngeal- Thin Straw -- Pharyngeal -- Pharyngeal- Puree Reduced tongue base retraction;Pharyngeal residue - valleculae Pharyngeal -- Pharyngeal- Mechanical Soft -- Pharyngeal -- Pharyngeal- Regular -- Pharyngeal -- Pharyngeal- Multi-consistency -- Pharyngeal -- Pharyngeal- Pill -- Pharyngeal -- Pharyngeal Comment --  CHL IP CERVICAL ESOPHAGEAL PHASE 01/26/2019 Cervical Esophageal Phase WFL Pudding Teaspoon -- Pudding Cup -- Honey Teaspoon  -- Honey Cup -- Nectar Teaspoon -- Nectar Cup -- Nectar Straw -- Thin Teaspoon -- Thin Cup -- Thin Straw -- Puree -- Mechanical Soft -- Regular -- Multi-consistency -- Pill -- Cervical Esophageal Comment -- Dannial Monarch 01/27/2019, 11:08 AM              Ct Head Code Stroke Wo Contrast  Result Date: 01/25/2019 CLINICAL DATA:  Code stroke. 85 year old female with slurred speech and right side weakness. EXAM: CT HEAD WITHOUT CONTRAST TECHNIQUE: Contiguous axial images were obtained from the base of the skull through the vertex without intravenous contrast. COMPARISON:  Head CT 08/18/2014. Brain MRI 03/09/2014. FINDINGS: Brain: Cerebral volume is not significantly changed. No ventriculomegaly. No acute intracranial hemorrhage identified. No midline shift, mass effect, or evidence of intracranial mass lesion. 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 Small areas of cortical and subcortical white matter hypodensity/encephalomalacia in the posterior left MCA territory on series 2, image 20 and 18 appear increased but not brand new. There is a similar chronic appearing much smaller right parietal cortical infarct best seen on sagittal image 8. No other cortically based infarct identified. Vascular: Tortuous. Calcified atherosclerosis at the skull base. No suspicious intracranial vascular hyperdensity. Skull: No acute osseous abnormality identified. Sinuses/Orbits: Visualized paranasal sinuses and mastoids are stable and well pneumatized. Other: Small nonspecific left forehead scalp soft tissue polypoid lesion on series 3, image 52 is new since 2015 but probably benign. Interval postoperative changes to both globes. ASPECTS [facility] Stroke Program Early CT Score) - Ganglionic level infarction (caudate, lentiform nuclei, internal capsule, insula, M1-M3 cortex): -  Supraganglionic infarction (M4-M6 cortex): Total score (0-10 with 10 being normal): IMPRESSION: 1. Advanced chronic ischemic disease with progression in both cerebral and cerebellar hemispheres since 2015. Several bilateral areas are age indeterminate. 2. No acute intracranial hemorrhage or mass effect. 3.  No suspicious intracranial vascular hyperdensity. Study discussed by telephone with Dr. Virgel Manifold on 01/25/2019 at 22:40 . Electronically  Signed   By: Genevie Ann M.D.   On: 01/25/2019 22:41   Vas [place] Lower Extremity Venous (dvt)  Result Date: 01/28/2019  Lower Venous Study Indications: Embolic stroke.  Performing Technologist: Antonieta Pert RDMS, RVT  Examination Guidelines: A complete evaluation includes B-mode imaging, spectral Doppler, color Doppler, and power Doppler as needed of all accessible portions of each vessel. Bilateral testing is considered an integral part of a complete examination. Limited examinations for reoccurring indications may be performed as noted.  Right Venous Findings: +---------+---------------+---------+-----------+----------+-------------------+            Compressibility Phasicity Spontaneity Properties Summary              +---------+---------------+---------+-----------+----------+-------------------+  CFV       Full            Yes       Yes                                         +---------+---------------+---------+-----------+----------+-------------------+  SFJ       Full                                                                  +---------+---------------+---------+-----------+----------+-------------------+  FV Prox   Full                                                                  +---------+---------------+---------+-----------+----------+-------------------+  FV Mid    Full                                                                  +---------+---------------+---------+-----------+----------+-------------------+  FV Distal Full                                                                   +---------+---------------+---------+-----------+----------+-------------------+  PFV       Full                                                                  +---------+---------------+---------+-----------+----------+-------------------+  POP       Full            Yes       Yes                                         +---------+---------------+---------+-----------+----------+-------------------+  PTV       Full                                                                  +---------+---------------+---------+-----------+----------+-------------------+  PERO                                                       not well visualized  +---------+---------------+---------+-----------+----------+-------------------+  GSV       Full                                                                  +---------+---------------+---------+-----------+----------+-------------------+  Left Venous Findings: +---------+---------------+---------+-----------+----------+-------+            Compressibility Phasicity Spontaneity Properties Summary  +---------+---------------+---------+-----------+----------+-------+  CFV       Full            Yes       Yes                             +---------+---------------+---------+-----------+----------+-------+  SFJ       Full                                                      +---------+---------------+---------+-----------+----------+-------+  FV Prox   Full                                                      +---------+---------------+---------+-----------+----------+-------+  FV Mid    Full                                                      +---------+---------------+---------+-----------+----------+-------+  FV Distal Full                                                      +---------+---------------+---------+-----------+----------+-------+  PFV       Full                                                       +---------+---------------+---------+-----------+----------+-------+  POP  Full            Yes       Yes                             +---------+---------------+---------+-----------+----------+-------+  PTV       Full                                                      +---------+---------------+---------+-----------+----------+-------+  PERO      Full                                                      +---------+---------------+---------+-----------+----------+-------+  GSV       Full                                                      +---------+---------------+---------+-----------+----------+-------+    Summary: Right: There is no evidence of deep vein thrombosis in the lower extremity. However, portions of this examination were limited- see technologist comments above. No cystic structure found in the popliteal fossa. Left: There is no evidence of deep vein thrombosis in the lower extremity. No cystic structure found in the popliteal fossa.  *See table(s) above for measurements and observations. Electronically signed by Deitra Mayo MD on 01/28/2019 at 79:45:31 AM.    Final      Phillips Climes M.D on 01/29/2019 at 11:23 AM  Between 7am to 7pm - Pager - [phone]  After 7pm go to [email or web address] - password [email or web address] [email or web address]  [facility] -  Office  [phone]

## 2019-01-30 ENCOUNTER — Inpatient Hospital Stay (HOSPITAL_COMMUNITY): Payer: Self-pay | Admitting: Occupational Therapy

## 2019-01-30 ENCOUNTER — Inpatient Hospital Stay (HOSPITAL_COMMUNITY): Payer: Self-pay

## 2019-01-30 ENCOUNTER — Other Ambulatory Visit: Payer: Self-pay

## 2019-01-30 LAB — CBC WITH DIFFERENTIAL/PLATELET
Abs Immature Granulocytes: 0.05 10*3/uL (ref 0.00–0.07)
BASOS ABS: 0.1 10*3/uL (ref 0.0–0.1)
Basophils Relative: 1 %
Eosinophils Absolute: 0.7 10*3/uL — ABNORMAL HIGH (ref 0.0–0.5)
Eosinophils Relative: 7 %
HCT: 45.4 % (ref 36.0–46.0)
Hemoglobin: 14.1 g/dL (ref 12.0–15.0)
Immature Granulocytes: 1 %
Lymphocytes Relative: 8 %
Lymphs Abs: 0.8 10*3/uL (ref 0.7–4.0)
MCH: 27.2 pg (ref 26.0–34.0)
MCHC: 31.1 g/dL (ref 30.0–36.0)
MCV: 87.5 fL (ref 80.0–100.0)
Monocytes Absolute: 0.7 10*3/uL (ref 0.1–1.0)
Monocytes Relative: 7 %
NRBC: 0 % (ref 0.0–0.2)
Neutro Abs: 7.6 10*3/uL (ref 1.7–7.7)
Neutrophils Relative %: 76 %
Platelets: 871 10*3/uL — ABNORMAL HIGH (ref 150–400)
RBC: 5.19 MIL/uL — ABNORMAL HIGH (ref 3.87–5.11)
RDW: 15.9 % — ABNORMAL HIGH (ref 11.5–15.5)
WBC: 10 10*3/uL (ref 4.0–10.5)

## 2019-01-30 LAB — COMPREHENSIVE METABOLIC PANEL
ALT: 11 U/L (ref 0–44)
AST: 22 U/L (ref 15–41)
Albumin: 2.6 g/dL — ABNORMAL LOW (ref 3.5–5.0)
Alkaline Phosphatase: 38 U/L (ref 38–126)
Anion gap: 10 (ref 5–15)
BUN: 24 mg/dL — ABNORMAL HIGH (ref 8–23)
CO2: 23 mmol/L (ref 22–32)
Calcium: 8.6 mg/dL — ABNORMAL LOW (ref 8.9–10.3)
Chloride: 110 mmol/L (ref 98–111)
Creatinine, Ser: 1.01 mg/dL — ABNORMAL HIGH (ref 0.44–1.00)
GFR calc Af Amer: 57 mL/min — ABNORMAL LOW (ref >60)
GFR calc non Af Amer: 49 mL/min — ABNORMAL LOW (ref >60)
Glucose, Bld: 92 mg/dL (ref 70–99)
POTASSIUM: 4 mmol/L (ref 3.5–5.1)
SODIUM: 143 mmol/L (ref 135–145)
Total Bilirubin: 1 mg/dL (ref 0.3–1.2)
Total Protein: 5.3 g/dL — ABNORMAL LOW (ref 6.5–8.1)

## 2019-01-30 MED ORDER — AMLODIPINE BESYLATE 2.5 MG PO TABS
2.5000 mg | ORAL_TABLET | Freq: Every day | ORAL | Status: DC
  Administered 2019-01-30 – 2019-02-09 (×11): 2.5 mg via ORAL
  Filled 2019-01-30 (×11): qty 1

## 2019-01-30 NOTE — Care Management Note (Signed)
[facility]patient [facility] Individual Statement of Services  Patient Name:  Jill Castaneda  Date:  01/30/2019  Welcome to the [facility].  Our goal is to provide you with an individualized program based on your diagnosis and situation, designed to meet your specific needs.  With this comprehensive rehabilitation program, you will be expected to participate in at least 3 hours of rehabilitation therapies Monday-Friday, with modified therapy programming on the weekends.  Your rehabilitation program will include the following services:  Physical Therapy (PT), Occupational Therapy (OT), Speech Therapy (ST), 24 hour per day rehabilitation nursing, Case Management (Social Worker), Rehabilitation Medicine, Nutrition Services and Pharmacy Services  Weekly team conferences will be held on Wednesday to discuss your progress.  Your Social Worker will talk with you frequently to get your input and to update you on team discussions.  Team conferences with you and your family in attendance may also be held.  Expected length of stay: 10-12 days  Overall anticipated outcome: supervision level  Depending on your progress and recovery, your program may change. Your Social Worker will coordinate services and will keep you informed of any changes. Your Social Worker's name and contact numbers are listed  below.  The following services may also be recommended but are not provided by the [facility]:    [facility] will be made to provide these services after discharge if needed.  Arrangements include referral to agencies that provide these services.  Your insurance has been verified to be:  Self Pay Your primary doctor is:  None  Pertinent information will be shared with your doctor and your insurance company.  Social Worker:  Ovidio Kin, [place] or ([phone]  Information  discussed with and copy given to patient by: Elease Hashimoto, 01/30/2019, 1:06 PM

## 2019-01-30 NOTE — Evaluation (Signed)
Speech Language Pathology Assessment and Plan  Patient Details  Name: Jill Castaneda MRN: [id number] Date of Birth: [birth year]  SLP Diagnosis: Dysarthria;Dysphagia  Rehab Potential: Good ELOS: 7-10 days     Today's Date: 01/30/2019 SLP Individual Time: 0803-0900 SLP Individual Time Calculation (min): 57 min   Problem List:  Patient Active Problem List   Diagnosis Date Noted  . Cerebrovascular accident (CVA) of right basal ganglia ([place]) 01/29/2019  . MPN (myeloproliferative neoplasm) ([place])   . Dysarthria 01/26/2019  . Coronary artery disease 01/26/2019  . History of stroke with residual deficit 01/26/2019  . CVA (cerebral vascular accident) ([place]) 01/26/2019  . Generalized weakness   . Closed displaced fracture of right femoral neck ([place])   . Hypokalemia   . Thrombocytosis ([place]) 06/20/2017  . Displaced fracture of right femoral neck ([place]) 06/20/2017  . NSTEMI (non-ST elevated myocardial infarction) ([place]) 04/15/2014  . Non-STEMI (non-ST elevated myocardial infarction) ([place]) 04/13/2014  . Hypertension   . CVA (cerebral infarction) 03/08/2014   Past Medical History:  Past Medical History:  Diagnosis Date  . Cataract   . Complication of anesthesia    Difficulty waking up "  . Coronary artery disease   . Hip fracture ([place]) 06/2017   right hip  . Hypertension   . Osteoporosis   . Stroke [facility])    Past Surgical History:  Past Surgical History:  Procedure Laterality Date  . ANTERIOR APPROACH HEMI HIP ARTHROPLASTY Right 06/20/2017   Procedure: ANTERIOR APPROACH HEMI HIP ARTHROPLASTY;  Surgeon: Rod Can, MD;  Location: [facility];  Service: Orthopedics;  Laterality: Right;  . FRACTURE SURGERY    . SHOULDER HARDWARE REMOVAL Left   . SHOULDER HEMI-ARTHROPLASTY Left   . TOTAL HIP ARTHROPLASTY Right     Assessment / Plan / Recommendation Clinical Impression Jill Castaneda is an 85 year old right handed non- English-speaking female with history of CAD, left midbrain and left  thalamic infarct in 2015 with residual diplopia and right side weakness with tremor, hypertension, history of pulmonary emboli and hip replacement.per chart review patient lives with granddaughter it was a home health physical therapist and provide assistance as needed. Independent with assistive device prior to admission dresses and bathes herself. Presented 01/26/2019 with generalized weakness and slurred speech of acute onset. Cranial CT scan showed advanced chronic ischemic disease with progression of both cerebral cerebellar hemispheres since 2015. No acute change. Patient did not receive TPA.MRI showed infarction posterior limb internal capsule on the right extending into the caudate head. Small area of acute infarction right parietal cortex. Echocardiogram with ejection fraction of 40% normal systolic function. CT angiogram of head and neck negative for large vessel occlusion. Lower extremity Dopplers negative for DVT. Neurology consulted maintained on aspirin and Plavix 3 weeks then Plavix alone. Subcutaneous Lovenox for DVT prophylaxis.Recommendations for 30 day cardiac event monitor.Presently on a dysphagia #1 nectar liquid diet. Hematology consulted 01/28/2019 for thrombocytosis 1024/polycythemia and presently with conservative care follow-up outpatient. Plan to start Hydrea 500 mg daily as swallow improves. Therapy evaluation completed with recommendations of physical medicine rehabilitation consult. Patient was admitted for a compress of rehabilitation program.  Pt presents moderate dysarthria and dysphagia impairments from acute CVA. Pt's granddaughter, Jill Castaneda, was present, provided services as interpreter for naive [place] language.  Pt presents moderate dysarthria at word level with 70% intelligibility due to imprecise articulation and general weakness. Ana reported increase to 80% intelligibility at word level with cues for speech intelligibility strategies for articulation. Pt presents with mild  vocal intensity impairment  at word level, Ana reported 80% intelligibility compared to baseline.   Pt presents with moderate oral dysphagia, overall impacted by fatigue and general weakness. Pt was edentulous, ill fitting dentures not present and oral motor exam presented generalized weakness. Pt demonstrates impairment in bolus cohesion, lingual manipulation and slight left anterior spillage during ice chips trials x3, immediate cough noted with large chips and 50% delayed throat clear on small ice chips, as well as immediate cough with thin via tsp and small cup sips. SLP facilitated PO consumption trials of NTL via cup with only delayed throat clear noted and continued inability to suck from straw. Pt demonstrated increase mastication time and multiple swallows , however no oral residue noted on limited dys 1 and dys 2 trials. As of note, pt required multiple swallows (2-3) with all liquid and solid trials. MBS report on 3/27 supports moderate oral and mild pharyngeal dysphasia with generalized fatigue/weakness and sensed aspiration on thin via tsp. SLP recommends continuing current diet of dys 1 and NTL via cup, with advancement of liquids with water protocol focusing on small ice chips and dys 2 trials. Ana supports cognitive linguistic baseline, pt demonstrated ability to follow basic commands, name common items and functional attention, however continue cognitive linguistic assessment as needed. Pt would benefit from skilled ST services in order to maximize functional independence and reduce burden of care, requiring 24 hour supervision and continued ST services.     Skilled Therapeutic Interventions          Skilled ST services focused on swallow skills. Upon entering pt's granddaughter, Jill Castaneda was providing thin via cup to pt, pt demonstrated immediate cough. Ana explained she had just completed oral care, 5 minutes piror to ST arrival. SLP facilitated PO consumption trials of ice chips/thin, pt required  min A verbal cues for swallow strategies. SLP recommends beginning water protocol and educated [place] that she will be sign off to provided trials in future session, if deemed appropriate but for now will only be provided by therapy/nursing staff, Jill Castaneda stated understanding. Pt was left in room with call bell within reach, family in room and bed alarm set. ST recommends to continue skilled ST services.    SLP Assessment  Patient will need skilled Speech Lanaguage Pathology Services during CIR admission    Recommendations  SLP Diet Recommendations: Dysphagia 1 (Puree);Nectar Liquid Administration via: Cup Medication Administration: Crushed with puree Supervision: Full supervision/cueing for compensatory strategies Compensations: Slow rate;Small sips/bites;Minimize environmental distractions Postural Changes and/or Swallow Maneuvers: Seated upright 90 degrees Oral Care Recommendations: Oral care QID Patient destination: Home Follow up Recommendations: 24 hour supervision/assistance;Home Health SLP Equipment Recommended: None recommended by SLP    SLP Frequency 3 to 5 out of 7 days   SLP Duration  SLP Intensity  SLP Treatment/Interventions 7-10 days   Minumum of 1-2 x/day, 30 to 90 minutes  Cueing hierarchy;Dysphagia/aspiration precaution training;Functional tasks;Patient/family education    Pain Pain Assessment Pain Score: 0-No pain  Prior Functioning Cognitive/Linguistic Baseline: Baseline deficits Baseline deficit details: mild expressive language deficits from prior CVA, near baseline Type of Home: House  Lives With: Other (Comment)(granddaughter ) Available Help at Discharge: Family;Available 24 hours/day Vocation: Retired  Industrial/product designer Term Goals: Week 1: SLP Short Term Goal 1 (Week 1): STG=LTG due to short ELOS  Refer to Care Plan for Long Term Goals  Recommendations for other services: None   Discharge Criteria: Patient will be discharged from SLP if patient refuses treatment  3 consecutive times without medical reason, if  treatment goals not met, if there is a change in medical status, if patient makes no progress towards goals or if patient is discharged from hospital.  The above assessment, treatment plan, treatment alternatives and goals were discussed and mutually agreed upon: by patient and by family  Derwood Becraft  [facility] 01/30/2019, 6:14 PM

## 2019-01-30 NOTE — Progress Notes (Signed)
Social Work Social Work Assessment and Plan  Patient Details  Name: Yarimar Lavis MRN: [id number] Date of Birth: Dec 19, 1927  Today's Date: 01/30/2019  Problem List:  Patient Active Problem List   Diagnosis Date Noted  . Cerebrovascular accident (CVA) of right basal ganglia ([place]) 01/29/2019  . MPN (myeloproliferative neoplasm) ([place])   . Dysarthria 01/26/2019  . Coronary artery disease 01/26/2019  . History of stroke with residual deficit 01/26/2019  . CVA (cerebral vascular accident) ([place]) 01/26/2019  . Generalized weakness   . Closed displaced fracture of right femoral neck ([place])   . Hypokalemia   . Thrombocytosis ([place]) 06/20/2017  . Displaced fracture of right femoral neck ([place]) 06/20/2017  . NSTEMI (non-ST elevated myocardial infarction) ([place]) 04/15/2014  . Non-STEMI (non-ST elevated myocardial infarction) ([place]) 04/13/2014  . Hypertension   . CVA (cerebral infarction) 03/08/2014   Past Medical History:  Past Medical History:  Diagnosis Date  . Cataract   . Complication of anesthesia    Difficulty waking up "  . Coronary artery disease   . Hip fracture ([place]) 06/2017   right hip  . Hypertension   . Osteoporosis   . Stroke [facility])    Past Surgical History:  Past Surgical History:  Procedure Laterality Date  . ANTERIOR APPROACH HEMI HIP ARTHROPLASTY Right 06/20/2017   Procedure: ANTERIOR APPROACH HEMI HIP ARTHROPLASTY;  Surgeon: Rod Can, MD;  Location: [facility];  Service: Orthopedics;  Laterality: Right;  . FRACTURE SURGERY    . SHOULDER HARDWARE REMOVAL Left   . SHOULDER HEMI-ARTHROPLASTY Left   . TOTAL HIP ARTHROPLASTY Right    Social History:  reports that she has never smoked. She has never used smokeless tobacco. She reports that she does not drink alcohol or use drugs.  Family / Support Systems Marital Status: Divorced Patient Roles: Other (Comment)(grandparent) Other Supports: Ana Steed-granddaughter [phone]-cell great granddaughter local  also Anticipated Caregiver: Ana Ability/Limitations of Caregiver: Is a HHPT works on the weekends, but has taken time off during this Caregiver Availability: 24/7 Family Dynamics: Close knit family who will provide care to her. She was doing well before this and was mod/i level. Moved here permantly 2015 with granddaughter  Social History Preferred language: [place] Religion: None Cultural Background: from [place] does not speak [place] Education: Little schooling in [place] Read: No Write: No Employment Status: Retired Public relations account executive Issues: No issues Guardian/Conservator: Ana-granddaughter pt looks toward her to make [place] decision for her. MD does feel pt is capable but with her language issue will communicate with [facility]   Abuse/Neglect Abuse/Neglect Assessment Can Be Completed: Yes Physical Abuse: Denies Verbal Abuse: Denies Sexual Abuse: Denies Exploitation of patient/patient's resources: Denies Self-Neglect: Denies  Emotional Status Pt's affect, behavior and adjustment status: Pt lets Ana answer for her and does follow directions when [place] interprets to her. [name] is a PT and voiced, pt was mod/i and walked daily to keep active. She did have deficits from her CVA in 2015 but were mild. Ana hopeful pt will make progress and be mobile again. Recent Psychosocial Issues: other health issues-hx CVA in 2015 and had some residual weakness but this did not stop her Psychiatric History: No history deferred depression screen due to seems to be coping appropriately and happy granddaughter is here Substance Abuse History: No issues  Patient / Family Perceptions, Expectations & Goals Pt/Family understanding of illness & functional limitations: Pt and Ana can explain her stroke and deficits. Ana communicates with the MD and feels she has a good understanding  of her treatment plan going forward.  Premorbid pt/family roles/activities: Grandmother, neighbor, etc Anticipated changes in  roles/activities/participation: resume Pt/family expectations/goals: Pt nods and lets Ana answer. Ana states: " We hope she does well here and becomes mobile again."    [employer]: None Premorbid Home Care/DME Agencies: Other (Comment)(has rw, transport chair and risor) Transportation available at discharge: Granddaughter and great granddaughter Resource referrals recommended: Support group (specify)  Discharge Planning Living Arrangements: Other relatives Support Systems: Other relatives Type of Residence: Private residence Insurance Resources: Medicaid (specify county)(Emergency Medicaid only) Financial Resources: Family Training and development officer Screen Referred: No Living Expenses: Lives with family Money Management: Family Does the patient have any problems obtaining your medications?: No Home Management: Granddaughter Patient/Family Preliminary Plans: Return to granddaughter's home where her great granddaughter lives also, between both of them they can provide 24 hr care if needed. Ana works on the weekends for [employer] agency but is off currently. Will await team evaluations and work on paln  Clinical Impression Pleasant motivated female who according to granddaughter was mod/i and kept herself active prior to admission. Granddaughter is staying here with her for her interpreting since does not speak [place]. Family will provide 24 hr care if needed. Await therapy team evaluations.  Elease Hashimoto 01/30/2019, 1:04 PM

## 2019-01-30 NOTE — Evaluation (Signed)
Occupational Therapy Assessment and Plan  Patient Details  Name: Jill Castaneda No MRN: [id number] Date of Birth: 05-17-1928  OT Diagnosis: abnormal posture, disturbance of vision, hemiplegia affecting non-dominant side, muscle weakness (generalized) and decreased coordination Rehab Potential: Rehab Potential (ACUTE ONLY): Good ELOS: 10-12 days   Today's Date: 01/30/2019 OT Individual Time: 3300-7622 OT Individual Time Calculation (min): 76 min     Problem List:  Patient Active Problem List   Diagnosis Date Noted  . Cerebrovascular accident (CVA) of right basal ganglia ([place]) 01/29/2019  . MPN (myeloproliferative neoplasm) ([place])   . Dysarthria 01/26/2019  . Coronary artery disease 01/26/2019  . History of stroke with residual deficit 01/26/2019  . CVA (cerebral vascular accident) ([place]) 01/26/2019  . Generalized weakness   . Closed displaced fracture of right femoral neck ([place])   . Hypokalemia   . Thrombocytosis ([place]) 06/20/2017  . Displaced fracture of right femoral neck ([place]) 06/20/2017  . NSTEMI (non-ST elevated myocardial infarction) ([place]) 04/15/2014  . Non-STEMI (non-ST elevated myocardial infarction) ([place]) 04/13/2014  . Hypertension   . CVA (cerebral infarction) 03/08/2014    Past Medical History:  Past Medical History:  Diagnosis Date  . Cataract   . Complication of anesthesia    Difficulty waking up "  . Coronary artery disease   . Hip fracture ([place]) 06/2017   right hip  . Hypertension   . Osteoporosis   . Stroke [facility])    Past Surgical History:  Past Surgical History:  Procedure Laterality Date  . ANTERIOR APPROACH HEMI HIP ARTHROPLASTY Right 06/20/2017   Procedure: ANTERIOR APPROACH HEMI HIP ARTHROPLASTY;  Surgeon: Rod Can, MD;  Location: [place];  Service: Orthopedics;  Laterality: Right;  . FRACTURE SURGERY    . SHOULDER HARDWARE REMOVAL Left   . SHOULDER HEMI-ARTHROPLASTY Left   . TOTAL HIP ARTHROPLASTY Right     Assessment & Plan Clinical  Impression: Patient is a 85 y.o. year old female non-English-speaking female with history of CAD, left midbrain and left thalamic infarct in 2015 with residual diplopia and right side weakness with tremor, hypertension, history of pulmonary emboli and hip replacement.per chart review patient lives with granddaughter it was a home health physical therapist and provide assistance as needed. Independent with assistive device prior to admission dresses and bathes herself. Presented 01/26/2019 with generalized weakness and slurred speech of acute onset. Cranial CT scan showed advanced chronic ischemic disease with progression of both cerebral cerebellar hemispheres since 2015. No acute change. Patient did not receive TPA.MRI showed infarction posterior limb internal capsule on the right extending into the caudate head. Small area of acute infarction right parietal cortex. Echocardiogram with ejection fraction of 63% normal systolic function. CT angiogram of head and neck negative for large vessel occlusion. Lower extremity Dopplers negative for DVT. Neurology consulted maintained on aspirin and Plavix 3 weeks then Plavix alone. Subcutaneous Lovenox for DVT prophylaxis.Recommendations for 30 day cardiac event monitor.Presently on a dysphagia #1 nectar liquid diet. Hematology consulted 01/28/2019 for thrombocytosis 1024/polycythemia and presently with conservative care follow-up outpatient. Plan to start Hydrea 500 mg daily as swallow improves. Therapy evaluation completed with recommendations of physical medicine rehabilitation consult. Patient was admitted for a compress of rehabilitation program. .  Patient transferred to CIR on 01/29/2019 .    Patient currently requires min - mod with basic self-care skills secondary to muscle weakness, decreased cardiorespiratoy endurance, decreased coordination and decreased motor planning, diplopia, decreased midline orientation and decreased attention to left and decreased  sitting balance, decreased standing balance,  decreased postural control, hemiplegia and decreased balance strategies.  Prior to hospitalization, patient could complete ADLs with mod I - S.  Patient will benefit from skilled intervention to decrease level of assist with basic self-care skills prior to discharge home with care partner.  Anticipate patient will require 24 hour supervision and follow up home health.  OT - End of Session Activity Tolerance: Decreased this session Endurance Deficit: Yes Endurance Deficit Description: multiple rest breaks secondary to fatigue with self care tasks OT Assessment Rehab Potential (ACUTE ONLY): Good OT Barriers to Discharge: Other (comments) OT Barriers to Discharge Comments: none known at this time OT Patient demonstrates impairments in the following area(s): Balance;Endurance;Motor;Pain;Safety OT Basic ADL's Functional Problem(s): Eating;Grooming;Bathing;Dressing;Toileting OT Transfers Functional Problem(s): Toilet;Tub/Shower OT Additional Impairment(s): Fuctional Use of Upper Extremity OT Plan OT Intensity: Minimum of 1-2 x/day, 45 to 90 minutes OT Frequency: 5 out of 7 days OT Duration/Estimated Length of Stay: 10-12 days OT Treatment/Interventions: Balance/vestibular training;Neuromuscular re-education;Self Care/advanced ADL retraining;Therapeutic Exercise;Wheelchair propulsion/positioning;Cognitive remediation/compensation;DME/adaptive equipment instruction;Pain management;UE/LE Strength taining/ROM;Community reintegration;Patient/family education;UE/LE Coordination activities;Discharge planning;Functional mobility training;Psychosocial support;Therapeutic Activities OT Self Feeding Anticipated Outcome(s): supervision OT Basic Self-Care Anticipated Outcome(s): supervision OT Toileting Anticipated Outcome(s): supervision OT Bathroom Transfers Anticipated Outcome(s): supervision OT Recommendation Recommendations for Other Services: Neuropsych  consult Patient destination: Home Follow Up Recommendations: Home health OT;24 hour supervision/assistance Equipment Recommended: Tub/shower bench   Skilled Therapeutic Intervention Upon entering the room, pt supine in bed with granddaughter present in room. Pt with no c/o pain this session and agreeable to OT intervention. Pt performed supine >sit with min A and standing from bed with initially mod lifting assistance and progressing to min A during the session. Pt ambulating with RW to bathroom with min A. Pt with min A transfer onto toilet with L lateral lean noted. Pt able to correct with min verbal cuing to tactile cuing. Pt attempting to perform hygiene with L UE but unable this session. Pt doffing clothing and transferred onto TTB with min HHA. Pt remaining seated while showering with min cuing for sequencing, attending to task/L side, and mod cuing to utilize L UE as much as possible with cloth. Pt exiting and donning clothing from seated position on commode with focus on hemiplegic technique. Pt seated in recliner chair at end of session with call bell and all needed items within reach. Pt's granddaughter translating throughout session and is a PT. She is checked off to assist pt to bathroom via RW. OT educated pt and caregiver on OT purpose, POC, and goals with them verbalizing understanding and agreement.   OT Evaluation Precautions/Restrictions  Precautions Precautions: Fall Restrictions Weight Bearing Restrictions: No Pain Pain Assessment Pain Scale: 0-10 Pain Score: 0-No pain Home Living/Prior Functioning Home Living Available Help at Discharge: Family, Available 24 hours/day Type of Home: House Home Access: Stairs to enter [employer] of Steps: 3 Entrance Stairs-Rails: Right, Left Home Layout: Two level, Able to live on main level with bedroom/bathroom Alternate Level Stairs-Number of Steps: 13 steps Alternate Level Stairs-Rails: Left Bathroom Toilet: Handicapped  height Bathroom Accessibility: Yes Additional Comments: lives with granddaughter who works as a PT in [employer]  Lives With: Family Prior Function Level of Independence: Requires assistive device for independence Driving: No Vocation: Retired Comments: uses a RW at night time occasionally. gdaughter provides supervision with ambulating and showering Vision Baseline Vision/History: (diplopia from prior CVA) Patient Visual Report: No change from baseline Vision Assessment?: Vision impaired- to be further tested in functional context Additional Comments: Pt with decreased smoothness  in tracking with L eye Cognition Overall Cognitive Status: Within Functional Limits for tasks assessed Arousal/Alertness: Awake/alert Orientation Level: Person;Place;Situation Person: Oriented Place: Oriented Situation: Oriented Year: 2020 Month: March Day of Week: Correct Immediate Memory Recall: Sock;Blue;Bed Memory Recall: Sock;Blue;Bed Memory Recall Sock: Without Cue Memory Recall Blue: With Cue Memory Recall Bed: With Cue Sensation Sensation Light Touch: Appears Intact Hot/Cold: Appears Intact Proprioception: Impaired by gross assessment Stereognosis: Not tested Coordination Gross Motor Movements are Fluid and Coordinated: No Fine Motor Movements are Fluid and Coordinated: No Coordination and Movement Description: decreased [place] , dexterity, inattention to L side with tremor noted in R UE( not new onset but worse per family) Motor  Motor Motor: Hemiplegia Motor - Skilled Clinical Observations: generalized weakness Mobility  Bed Mobility Bed Mobility: Supine to Sit Supine to Sit: Minimal Assistance - Patient > 75% Transfers Sit to Stand: Minimal Assistance - Patient > 75% Stand to Sit: Minimal Assistance - Patient > 75%  Trunk/Postural Assessment  Cervical Assessment Cervical Assessment: Exceptions to WFL(forward head) Thoracic Assessment Thoracic Assessment: Exceptions to  WFL(kyphotic) Lumbar Assessment Lumbar Assessment: Exceptions to WFL(posterior pelvic tilt) Postural Control Postural Control: Deficits on evaluation(delayed)  Balance Balance Balance Assessed: Yes Dynamic Sitting Balance Dynamic Sitting - Balance Support: During functional activity;Feet supported Dynamic Sitting - Level of Assistance: 5: Stand by assistance;4: Min assist Dynamic Standing Balance Dynamic Standing - Balance Support: During functional activity;Right upper extremity supported Dynamic Standing - Level of Assistance: 4: Min assist;3: Mod assist Dynamic Standing - Balance Activities: Reaching for objects;Reaching across midline Extremity/Trunk Assessment RUE Assessment RUE Assessment: Within Functional Limits General Strength Comments: 3+/5 gross strength LUE Assessment LUE Assessment: Exceptions to [facility] General Strength Comments: 3-/5 gross strength with inattention     Refer to Care Plan for Long Term Goals  Recommendations for other services: Neuropsych   Discharge Criteria: Patient will be discharged from OT if patient refuses treatment 3 consecutive times without medical reason, if treatment goals not met, if there is a change in medical status, if patient makes no progress towards goals or if patient is discharged from hospital.  The above assessment, treatment plan, treatment alternatives and goals were discussed and mutually agreed upon: by patient and by family  Gypsy Decant 01/30/2019, 11:29 AM

## 2019-01-30 NOTE — Plan of Care (Signed)
  Problem: Consults Goal: RH STROKE PATIENT EDUCATION Description See Patient Education module for education specifics  Outcome: Progressing   Problem: RH BLADDER ELIMINATION Goal: RH STG MANAGE BLADDER WITH ASSISTANCE Description STG Manage Bladder With min Assistance  Outcome: Progressing   Problem: RH SAFETY Goal: RH STG ADHERE TO SAFETY PRECAUTIONS W/ASSISTANCE/DEVICE Description STG Adhere to Safety Precautions With cues/reminders Assistance/Device.  Outcome: Progressing   Problem: RH KNOWLEDGE DEFICIT Goal: RH STG INCREASE KNOWLEDGE OF DYSPHAGIA/FLUID INTAKE Description Grand-daughter will be able to manage diet/fluid with handouts/education independently  Outcome: Progressing Goal: RH STG INCREASE KNOWLEGDE OF HYPERLIPIDEMIA Description Family will be able to manage HLD independently using education on medications and dietary restrictions  Outcome: Progressing Goal: RH STG INCREASE KNOWLEDGE OF STROKE PROPHYLAXIS Description Family will be able to note prevention for secondary stroke  Using medication and education and resources independently  Outcome: Progressing

## 2019-01-30 NOTE — Evaluation (Addendum)
Physical Therapy Assessment and Plan  Patient Details  Name: Jill Castaneda MRN: [id number] Date of Birth: 1928/10/05  PT Diagnosis: Abnormal posture, Abnormality of gait, Ataxia, Coordination disorder, Difficulty walking, Hemiplegia non-dominant and Muscle weakness Rehab Potential: Fair ELOS: 8-10 days   Today's Date: 01/30/2019 PT Individual Time: 3149-7026 and 1415-1500 PT Individual Time Calculation (min): 64 min and 60 min  Problem List:  Patient Active Problem List   Diagnosis Date Noted  . Cerebrovascular accident (CVA) of right basal ganglia ([place]) 01/29/2019  . MPN (myeloproliferative neoplasm) ([place])   . Dysarthria 01/26/2019  . Coronary artery disease 01/26/2019  . History of stroke with residual deficit 01/26/2019  . CVA (cerebral vascular accident) ([place]) 01/26/2019  . Generalized weakness   . Closed displaced fracture of right femoral neck ([place])   . Hypokalemia   . Thrombocytosis ([place]) 06/20/2017  . Displaced fracture of right femoral neck ([place]) 06/20/2017  . NSTEMI (non-ST elevated myocardial infarction) ([place]) 04/15/2014  . Non-STEMI (non-ST elevated myocardial infarction) ([place]) 04/13/2014  . Hypertension   . CVA (cerebral infarction) 03/08/2014    Past Medical History:  Past Medical History:  Diagnosis Date  . Cataract   . Complication of anesthesia    Difficulty waking up "  . Coronary artery disease   . Hip fracture ([place]) 06/2017   right hip  . Hypertension   . Osteoporosis   . Stroke [facility])    Past Surgical History:  Past Surgical History:  Procedure Laterality Date  . ANTERIOR APPROACH HEMI HIP ARTHROPLASTY Right 06/20/2017   Procedure: ANTERIOR APPROACH HEMI HIP ARTHROPLASTY;  Surgeon: Rod Can, MD;  Location: [facility];  Service: Orthopedics;  Laterality: Right;  . FRACTURE SURGERY    . SHOULDER HARDWARE REMOVAL Left   . SHOULDER HEMI-ARTHROPLASTY Left   . TOTAL HIP ARTHROPLASTY Right     Assessment & Plan Clinical Impression: Patient is a  85 y.o. year old right handednon-English-speaking female with history of CAD, left midbrain and left thalamic infarct in 2015 with residual diplopia and right side weakness with tremor, hypertension, history of pulmonary emboli and hip replacement.per chart review patient lives with granddaughter it was a home health physical therapist and provide assistance as needed. Independent with assistive device prior to admission dresses and bathes herself. Presented 01/26/2019 with generalized weakness and slurred speech of acute onset. Cranial CT scan showed advanced chronic ischemic disease with progression of both cerebral cerebellar hemispheres since 2015. No acute change. Patient did not receive TPA.MRI showed infarction posterior limb internal capsule on the right extending into the caudate head. Small area of acute infarction right parietal cortex. Echocardiogram with ejection fraction of 37% normal systolic function. CT angiogram of head and neck negative for large vessel occlusion. Lower extremity Dopplers negative for DVT. Neurology consulted maintained on aspirin and Plavix 3 weeks then Plavix alone. Subcutaneous Lovenox for DVT prophylaxis.Recommendations for 30 day cardiac event monitor.Presently on a dysphagia #1 nectar liquid diet. Hematology consulted 01/28/2019 for thrombocytosis 1024/polycythemia and presently with conservative care follow-up outpatient. Plan to start Hydrea 500 mg daily as swallow improves. Therapy evaluation completed with recommendations of physical medicine rehabilitation consult. Patient was admitted for a compress of rehabilitation program.  Patient transferred to CIR on 01/29/2019 .   Patient currently requires min with mobility secondary to muscle weakness, decreased cardiorespiratoy endurance, impaired timing and sequencing, abnormal tone, decreased coordination and decreased motor planning, decreased visual perceptual skills, decreased midline orientation, decreased attention  to left and left side neglect and decreased sitting  balance, decreased standing balance, decreased postural control, hemiplegia and decreased balance strategies.  Prior to hospitalization, patient was supervision with mobility and lived with Daughter in a House home.  Home access is 3Stairs to enter.  Patient will benefit from skilled PT intervention to maximize safe functional mobility, minimize fall risk and decrease caregiver burden for planned discharge home with 24 hour supervision.  Anticipate patient will benefit from follow up [facility] at discharge.  PT - End of Session Activity Tolerance: Tolerates 30+ min activity with multiple rests Endurance Deficit: Yes Endurance Deficit Description: multiple rest breaks secondary to fatigue with functional mobility PT Assessment Rehab Potential (ACUTE/IP ONLY): Fair PT Patient demonstrates impairments in the following area(s): Balance;Endurance;Motor;Perception;Safety;Skin Integrity PT Transfers Functional Problem(s): Bed Mobility;Bed to Chair;Car;Furniture;Floor PT Locomotion Functional Problem(s): Ambulation;Wheelchair Mobility;Stairs PT Plan PT Intensity: Minimum of 1-2 x/day ,45 to 90 minutes PT Frequency: 5 out of 7 days PT Duration Estimated Length of Stay: 8-10 days PT Treatment/Interventions: Ambulation/gait training;Community reintegration;DME/adaptive equipment instruction;Neuromuscular re-education;Psychosocial support;Stair training;UE/LE Strength taining/ROM;Wheelchair propulsion/positioning;Balance/vestibular training;Discharge planning;Functional electrical stimulation;Pain management;Skin care/wound management;Therapeutic Activities;UE/LE Coordination activities;Cognitive remediation/compensation;Disease management/prevention;Functional mobility training;Patient/family education;Splinting/orthotics;Therapeutic Exercise;Visual/perceptual remediation/compensation PT Transfers Anticipated Outcome(s): supervision PT Locomotion Anticipated  Outcome(s): supervision PT Recommendation Follow Up Recommendations: Home health PT;Outpatient PT Patient destination: Home Equipment Recommended: To be determined Equipment Details: Patient has a RW, cane, and transport chair  Skilled Therapeutic Intervention In addition to the PT evaluation below, the patient performed the following therapeutic interventions in 2 sessions: Session 1: Patient in bed asleep with granddaughter at bedside upon PT arrival. Patient easily aroused and agreeable to PT evaluation and session. Patient's granddaughter translated throughout session. Patient's native language is [place].   Therapeutic Activity: Transfers: Patient performed sit to/from stand x3 and a car transfer x1 using a RW and a stand pivot x2 with 1 HHA with min A for physical support and balance. Provided verbal cues for pushing up from the w/c to stand and reaching back before sitting.   Gait Training:  Patient ambulated 50' feet x2 using RW with min A for trunk support, as patient leans to the L, and balance. Ambulated with decreased gait speed, decreased step length on L, decreased weight shift to the R, increased knee flexion on R, L and forward trunk lean, downward head gaze, L toe out, L posteriorly rotated pelvis, decreased trunk rotation, and RW pulls to the R requiring assistance to recorrect. Provided verbal cues for erect posture, increasing step length on L, looking ahead, and steering straight ahead.  Wheelchair Mobility:  Patient propelled wheelchair 50 feet with min A for correcting steering to the L using B UE and B LE. Provided verbal cues for steering straight ahead and use of L UE and LE more to correct when steering to the L.  Patient in a chair with her granddaughter in the room at end of session with breaks locked, and all needs within reach. Granddaughter has been cleared to ambulate with the patient in the room at this time, she is a HHPT on the weekends. Patient and her  granddaughter were educated about ELOS, PT goals, rehab protocols and procedures, fall risk and prevention, and general therapy schedule and expectations throughout session.  Session 2: Patient in bed asleep with her granddaughter at bedside upon PT arrival. Patient alert and agreeable to PT session. Patient's granddaughter translated throughout session. Patient demonstrated improvements with functional mobility, gait, and maintaining midline throughout afternoon session.  Therapeutic Activity: Bed Mobility: Patient performed supine to/from sit with CGA for balance  and safety. Provided verbal cues for bringing her feet off of the bed before sitting up. Transfers: Patient performed sit to/from stand x6 with CGA using a RW and x2 with HHA with min A for balance and safety. Provided verbal cues for pushing up from the w/c to stand and reaching back before sitting.   Gait Training:  Patient ambulated 150 feet x2 using RW with CGA for safety and balance and ambulated 100 feet x1 with HHA with CGA-min A for balance and safety. Ambulated with the RW with decreased gait speed, decreased step length on L, decreased weight shift to the R, increased knee flexion on R, L and forward trunk lean (increases with fatigue), downward head gaze, L toe out, L posteriorly rotated pelvis, decreased trunk rotation, and RW pulls to the R requiring assistance to recorrect. Provided verbal cues and demonstration for erect posture, increasing step length on L, looking ahead, and steering straight ahead with significant improvement of posture and steering of RW on second trial. With HHA patient ambulated with improved posture, and increased gait speed and step length with cuing.   Neuromuscular Re-ed: Patient performed static standing with feet apart for 1 min, feet together for 1 min, staggered stand with R foot forward for 30 seconds, tandem stance with L foot and R foot forward for 19 and 11 seconds respectively. She used UE  support on the RW to get into each position then transitioned to no UE support before time was started. Patient performed SLS with 1 UE for 10 seconds on each side and for 30 seconds with B UE with 2 fingers touching on each side. Patient and her granddaughter swayed in standing with B HHA from her granddaughter for 2 minutes as they hummed a familiar song.  Patient in bed at end of session with breaks locked, bed alarm set, her granddaughter at her bedside, and all needs within reach.    PT Evaluation Precautions/Restrictions Precautions Precautions: Fall Restrictions Weight Bearing Restrictions: No General   Vital Signs Pain Pain Assessment Pain Scale: 0-10 Pain Score: 0-No pain Home Living/Prior Functioning Home Living Living Arrangements: Other relatives Available Help at Discharge: Family;Available 24 hours/day Type of Home: House Home Access: Stairs to enter [employer] of Steps: 3 Entrance Stairs-Rails: Right;Left Home Layout: Two level;Able to live on main level with bedroom/bathroom Alternate Level Stairs-Number of Steps: 13 steps (rarely goes upstairs) Alternate Level Stairs-Rails: Left Bathroom Toilet: Handicapped height Bathroom Accessibility: Yes Additional Comments: lives with granddaughter who works as a PT in [employer] and 31 yo granddaughter  Lives With: Daughter Prior Function Level of Independence: Needs assistance with ADLs;Independent with transfers;Independent with homemaking with ambulation;Independent with gait;Requires assistive device for independence  Able to Take Stairs?: Yes Driving: No Vocation: Retired Comments: uses a RW at night time occasionally. gdaughter provides supervision with ambulating and showering. Daughter home M-F 24/7 and will arrange someone to be with her on the w/e Vision/Perception  Vision - Assessment Additional Comments: Pt with decreased smoothness in tracking with L eye  Cognition Overall Cognitive Status: Within  Functional Limits for tasks assessed Arousal/Alertness: Awake/alert Orientation Level: Oriented to person;Oriented to place;Oriented to time Attention: Sustained Sustained Attention: Appears intact Memory: Appears intact Awareness: Appears intact Problem Solving: Impaired Problem Solving Impairment: Functional complex Safety/Judgment: Impaired Comments: poor safety awareness with L neglect  Sensation Sensation Light Touch: Appears Intact Hot/Cold: Appears Intact Proprioception: Impaired by gross assessment Stereognosis: Not tested Coordination Gross Motor Movements are Fluid and Coordinated: No Fine Motor Movements  are Fluid and Coordinated: No Coordination and Movement Description: decreased [place] , dexterity, inattention to L side with tremor noted in R UE( not new onset but worse per family) Finger Nose Finger Test: dysmetria with the L UE and strong intention tremor with good accuracy with the R UE Motor  Motor Motor: Hemiplegia Motor - Skilled Clinical Observations: generalized weakness with L hemiplegia  Mobility Bed Mobility Bed Mobility: Rolling Right;Rolling Left;Supine to Sit Rolling Right: Minimal Assistance - Patient > 75% Rolling Left: Minimal Assistance - Patient > 75% Supine to Sit: Minimal Assistance - Patient > 75% Transfers Transfers: Sit to Stand;Stand to Sit;Stand Pivot Transfers Sit to Stand: Minimal Assistance - Patient > 75% Stand to Sit: Minimal Assistance - Patient > 75% Stand Pivot Transfers: Minimal Assistance - Patient > 75% Stand Pivot Transfer Details: Tactile cues for placement;Verbal cues for technique;Tactile cues for posture;Verbal cues for sequencing;Verbal cues for safe use of DME/AE;Verbal cues for precautions/safety Stand Pivot Transfer Details (indicate cue type and reason): Tactile cues using hand over hand and verbal cues for hand placement on RW and reaching back to sit, and tactile cues for erect posture and finding midline-leans to the  L Transfer (Assistive device): Rolling walker Locomotion  Gait Ambulation: Yes Gait Assistance: Minimal Assistance - Patient > 75% Gait Distance (Feet): 50 Feet Assistive device: Rolling walker Gait Assistance Details: Tactile cues for weight shifting;Visual cues/gestures for sequencing;Verbal cues for technique;Verbal cues for gait pattern;Tactile cues for posture;Verbal cues for safe use of DME/AE Gait Assistance Details: tactile and verbal cues to weight shift to the R, increased step length on L, erect posture and finding midline, steering RW-veers to the L, and stepping close to the RW for safety Gait Gait: Yes Gait Pattern: Impaired Gait Pattern: Decreased step length - left;Step-to pattern;Decreased hip/knee flexion - left;Decreased weight shift to right;Left foot flat;Lateral trunk lean to left;Trunk rotated posteriorly on left;Abducted - left;Poor foot clearance - left;Trunk flexed;Decreased trunk rotation Gait velocity: decreased Stairs / Additional Locomotion Stairs: Yes Stairs Assistance: Minimal Assistance - Patient > 75% Stair Management Technique: Two rails Number of Stairs: 4 Height of Stairs: 6 Ramp: Moderate Assistance - Patient 50 - 74% Curb: Moderate Assistance - Patient 50 - 74%(with RW) Wheelchair Mobility Wheelchair Mobility: Yes Wheelchair Assistance: Minimal assistance - Patient >75% Wheelchair Propulsion: Both upper extremities;Both lower extermities Wheelchair Parts Management: Needs assistance Distance: 56'  Trunk/Postural Assessment  Cervical Assessment Cervical Assessment: Exceptions to WFL(forward head) Thoracic Assessment Thoracic Assessment: Exceptions to WFL(kyphosis) Lumbar Assessment Lumbar Assessment: Exceptions to WFL(posterior pelvic tilt) Postural Control Postural Control: Deficits on evaluation Righting Reactions: delayed or absent with LOB to the L Protective Responses: delayed in all directions Postural Limitations: limited within BOS   Balance Balance Balance Assessed: Yes Dynamic Sitting Balance Dynamic Sitting - Balance Support: During functional activity;Feet supported Dynamic Sitting - Level of Assistance: 5: Stand by assistance Dynamic Sitting Balance - Compensations: increased lean to the L, able to self correct Dynamic Sitting - Balance Activities: Lateral lean/weight shifting;Forward lean/weight shifting;Reaching for objects Sitting balance - Comments: performed reaching across midline and to the ipilateral side and forward in sitting without back support Dynamic Standing Balance Dynamic Standing - Balance Support: No upper extremity supported;Right upper extremity supported Dynamic Standing - Level of Assistance: 4: Min assist Dynamic Standing - Balance Activities: Reaching across midline;Reaching for objects Dynamic Standing - Comments: reaching for objects while holding on to RW with R UE for balance with assist for physical support Extremity Assessment  RUE Assessment  RUE Assessment: Exceptions to [facility] Active Range of Motion (AROM) Comments: WFL for all functional mobility General Strength Comments: Grossly in sitting: 4/5 throughout LUE Assessment LUE Assessment: Exceptions to [facility] Active Range of Motion (AROM) Comments: WFL for all functional mobility General Strength Comments: Grossly in sitting: 3-/5 throughout with inattention  RLE Assessment RLE Assessment: Exceptions to [facility] Active Range of Motion (AROM) Comments: WFL for all functional mobility General Strength Comments: Grossly in sitting: hip flexion 4+/5, knee flexion/extension 4+/5, ankle DF/PF 5/5 LLE Assessment LLE Assessment: Exceptions to [facility] Active Range of Motion (AROM) Comments: WFL for all functional mobility General Strength Comments: Grossly in sitting: hip flexion 3+/5, knee flexion/extension 4/5, ankle DF/PF 4/5    Refer to Care Plan for Long Term Goals  Recommendations for other services: None   Discharge Criteria: Patient  will be discharged from PT if patient refuses treatment 3 consecutive times without medical reason, if treatment goals not met, if there is a change in medical status, if patient makes no progress towards goals or if patient is discharged from hospital.  The above assessment, treatment plan, treatment alternatives and goals were discussed and mutually agreed upon: by patient and by family  Doreene Burke, PT, DPT 01/30/2019, 1:18 PM

## 2019-01-30 NOTE — Progress Notes (Signed)
[facility] PHYSICAL MEDICINE & REHABILITATION PROGRESS NOTE   Subjective/Complaints: Pt had a good night. Slept well. No pain or breathing issues. Ready for therapies today. granddaughter at bedside  ROS: limited due to language/communication   Objective:   No results found. Recent Labs    01/29/19 0736 01/30/19 0704  WBC 9.4 10.0  HGB 15.0 14.1  HCT 48.1* 45.4  PLT 894* 871*   Recent Labs    01/29/19 0736 01/30/19 0704  NA 140 143  K 4.0 4.0  CL 111 110  CO2 24 23  GLUCOSE 95 92  BUN 24* 24*  CREATININE 1.05* 1.01*  CALCIUM 8.5* 8.6*    Intake/Output Summary (Last 24 hours) at 01/30/2019 0916 Last data filed at 01/29/2019 1825 Gross per 24 hour  Intake 200 ml  Output -  Net 200 ml     Physical Exam: Vital Signs Blood pressure (!) 134/98, pulse 77, temperature 98.2 F (36.8 C), resp. rate 18, SpO2 90 %.   Constitutional: No distress . Vital signs reviewed. HEENT: EOMI, oral membranes moist, edentulous Neck: supple Cardiovascular: RRR without murmur. No JVD    Respiratory: CTA Bilaterally without wheezes or rales. Normal effort    GI: BS +, non-tender, non-distended  Neurological: She isalertand oriented to person, place, and time. granddaughter translated again. Pt followed all basic commands. Reasonable insight and awareness. RUE 4/5. LUE 3+ to 4-/5. RLE 4/5. LLE 3 to 4/5 prox to distal. Senses pain and LT in all 4's---motor/sensory stable. Skin: Skin iswarmand dry.  Psychiatric: pleasant and cooperative    Assessment/Plan: 1. Functional deficits secondary to right PLIC, caudate, parietal infarcts which require 3+ hours per day of interdisciplinary therapy in a comprehensive inpatient rehab setting.  Physiatrist is providing close team supervision and 24 hour management of active medical problems listed below.  Physiatrist and rehab team continue to assess barriers to discharge/monitor patient progress toward functional and medical  goals  Care Tool:  Bathing              Bathing assist       Upper Body Dressing/Undressing Upper body dressing Upper body dressing/undressing activity did not occur (including orthotics): Safety/medical concerns What is the patient wearing?: Hospital gown only    Upper body assist      Lower Body Dressing/Undressing Lower body dressing      What is the patient wearing?: Incontinence brief     Lower body assist Assist for lower body dressing: Contact Guard/Touching assist(Not seen, daughter assist with brief)     Toileting Toileting    Toileting assist Assist for toileting: Contact Guard/Touching assist(pt uses bsc)     Transfers Chair/bed transfer  Transfers assist     Chair/bed transfer assist level: Minimal Assistance - Patient > 75%     Locomotion Ambulation   Ambulation assist              Walk 10 feet activity   Assist           Walk 50 feet activity   Assist           Walk 150 feet activity   Assist           Walk 10 feet on uneven surface  activity   Assist           Wheelchair     Assist               Wheelchair 50 feet with 2 turns activity    Assist  Wheelchair 150 feet activity     Assist          Medical Problem List and Plan: 1.Left side weakness with dysarthria/dysphagiasecondary to acute infarctinthe right posterior limb of internal capsule, caudate body,and right parietal cortex. Plan 30 day event monitor on outpatient. Aspirin Plavix 3 weeks then Plavix alone -Patient is beginning CIR therapies today including PT, OT, and SLP  2. Antithrombotics: -DVT/anticoagulation:Subcutaneous Lovenox -antiplatelet therapy: Aspirin 81 mg daily, Plavix 75 mg daily 3. Pain Management:Tylenol as needed 4. Mood:Provide emotional support, granddaughter supportive -antipsychotic agents: N/A 5. Neuropsych: This  patientiscapable of making decisions on herown behalf. 6. Skin/Wound Care:Routine skin checks 7. Fluids/Electrolytes/Nutrition:encourage PO  -I personally reviewed the patient's labs today.    -BUN sl elevated- 24--need to push fluids 8. Dysphagia. Dysphagia #1 nectar liquids. Follow-up speech therapy 9. Hyperlipidemia. Lipitor 10. Thrombocytosis/polycythemia, likely MPN  -platelets in the 800k's -Plan is currently to follow-up outpatient for ongoing care(seen at [facility]). Her normal platelet count is between 800 and 100k. -Plan to begin Hydrea 500 mg daily once swallow function improves -pt received 1/2 unit blood phlebotomy prior to discharge to rehab 3/31 11. History of hypertension. Patient on Norvasc 2.5 mg daily prior to admission.  -Resume today    LOS: 1 days A FACE TO FACE EVALUATION WAS PERFORMED  Meredith Staggers 01/30/2019, 9:16 AM

## 2019-01-30 NOTE — Progress Notes (Signed)
Patient information reviewed and entered into [facility] System by Becky Preeya Cleckley, PPS coordinator. Information including medical coding, function ability, and quality indicators will be reviewed and updated through discharge.   

## 2019-01-31 ENCOUNTER — Inpatient Hospital Stay (HOSPITAL_COMMUNITY): Payer: Self-pay

## 2019-01-31 ENCOUNTER — Inpatient Hospital Stay (HOSPITAL_COMMUNITY): Payer: Self-pay | Admitting: Speech Pathology

## 2019-01-31 ENCOUNTER — Telehealth: Payer: Self-pay

## 2019-01-31 ENCOUNTER — Inpatient Hospital Stay (HOSPITAL_COMMUNITY): Payer: Self-pay | Admitting: Physical Therapy

## 2019-01-31 MED ORDER — NYSTATIN 100000 UNIT/ML MT SUSP
5.0000 mL | Freq: Four times a day (QID) | OROMUCOSAL | Status: DC
  Administered 2019-01-31 – 2019-02-05 (×19): 500000 [IU] via ORAL
  Filled 2019-01-31 (×18): qty 5

## 2019-01-31 NOTE — Progress Notes (Signed)
Physical Therapy Session Note  Patient Details  Name: Jill Castaneda MRN: [id number] Date of Birth: 01/24/1928  Today's Date: 01/31/2019 PT Individual Time: 6160-7371 PT Individual Time Calculation (min): 72 min   Short Term Goals: Week 1:  PT Short Term Goal 1 (Week 1): Patient will perform sitting balance without UE support for >2 minutes while maintaining midline without cuing. PT Short Term Goal 2 (Week 1): Patient will perform transfers from various household surfaces and car transfers with CGA for safety and balance. PT Short Term Goal 3 (Week 1): Patient will ambulate >68' with CGA for safety using LRAD. PT Short Term Goal 4 (Week 1): Patient will go up and down 4 steps using 1 rail and/or LRAD with CGA for safety and balance.  Skilled Therapeutic Interventions/Progress Updates: Pt presented in chair with granddaughter present agreeable to therapy and denies pain throughout session. Pt ambulated HHA to rehab gym with R knee flexed throughout and uneven step length. Discussed with granddaughter use of AD vs HHA as pt furniture walked prior to current stroke. Pt agreeable to trial [facility]. Pt ambulated 19f with SBQC with minA fading to CGA requiring cues for sequencing, strep length and increasing R knee extension. Pt held cane on L side due to R tremor thus also required intermittent cues for attention to L.  Pt performed STS x 5 no AD for BLE strengthening and increasing L wt bearing. Pt also performed STS with RLE on 2in step for increased L recruitment. Pt also performed toe taps to target on floor and to encourage increased L step length. Pt able to clear floor to hit target with mod/max cues. Pt required intermittent rest breaks due to fatigue. Pt ambulated back to room approx 1030fwith improved sequencing and transported back remaining distance. Pt transferred to bed stand pivot holding onto bed secondary to impulsiveness. Pt left sitting at EOB with granddaughter removing shoes and current  needs met.      Therapy Documentation Precautions:  Precautions Precautions: Fall Restrictions Weight Bearing Restrictions: No    Therapy/Group: Individual Therapy  Jamariyah Johannsen  Sophya Vanblarcom, PTA  01/31/2019, 2:23 PM

## 2019-01-31 NOTE — Telephone Encounter (Signed)
Oral Oncology Patient Advocate Encounter  The patient is uninsured. I spoke with her granddaughter, [name] and explained how patient assistance worked with [employer] and she was in agreement with starting this process. Ana asked for me to email her the application so she could get her grandmother to sign it and she will email me back, she is afraid to leave the rehab facility that her grandmother is in at the moment for fear that she will not be able to go back to be with her grandmother.  I will fax the application as soon I get it back from [place].  This encounter will be updated until final determination.  [place] Patient [place] Phone [phone] Fax [phone] 01/31/2019   2:04 PM

## 2019-01-31 NOTE — Progress Notes (Signed)
Social Work Patient ID: Jill Castaneda, female   DOB: 10/08/1928, 85 y.o.   MRN: [id number] Met with pt and granddaughter to inform team conference goals supervision level and target discharge date 4/9. Granddaughter hopeful her diet will be upgraded by discharge. She is here and participating in therapies along with interpreting for pt. Work toward discharge 4/9.   

## 2019-01-31 NOTE — Patient Care Conference (Signed)
Inpatient RehabilitationTeam Conference and Plan of Care Update Date: 01/31/2019   Time: 10:45 AM    Patient Name: Jill Castaneda      Medical Record Number: [id number]  Date of Birth: Jun 05, 1928 Sex: Female         Room/Bed: 4W20C/4W20C-01 Payor Info: Payor: MEDICAID POTENTIAL / Plan: MEDICAID POTENTIAL / Product Type: *No Product type* /    Admitting Diagnosis: CVA  Admit Date/Time:  01/29/2019  5:14 PM Admission Comments: No comment available   Primary Diagnosis:  <principal problem not specified> Principal Problem: <principal problem not specified>  Patient Active Problem List   Diagnosis Date Noted  . Cerebrovascular accident (CVA) of right basal ganglia ([place]) 01/29/2019  . MPN (myeloproliferative neoplasm) ([place])   . Dysarthria 01/26/2019  . Coronary artery disease 01/26/2019  . History of stroke with residual deficit 01/26/2019  . CVA (cerebral vascular accident) ([place]) 01/26/2019  . Generalized weakness   . Closed displaced fracture of right femoral neck ([place])   . Hypokalemia   . Thrombocytosis ([place]) 06/20/2017  . Displaced fracture of right femoral neck ([place]) 06/20/2017  . NSTEMI (non-ST elevated myocardial infarction) ([place]) 04/15/2014  . Non-STEMI (non-ST elevated myocardial infarction) ([place]) 04/13/2014  . Hypertension   . CVA (cerebral infarction) 03/08/2014    Expected Discharge Date: Expected Discharge Date: 02/08/19  Team Members Present: Physician leading conference: Dr. Delice Lesch Social Worker Present: Ovidio Kin, LCSW Nurse Present: Dorthula Nettles, RN PT Present: Phylliss Bob, PTA;Barrie Folk, PT OT Present: Darleen Crocker, OT SLP Present: Stormy Fabian, SLP PPS Coordinator present : Ileana Ladd, PT     Current Status/Progress Goal Weekly Team Focus  Medical   Patient admitted with right internal capsule, caudate, parietal infarcts felt to be secondary to embolus versus thrombosis.  Patient being treated for her thrombosis and polycythemia and  followed by hematology.  Potentially starting hydroxyurea tomorrow  Improve activity tolerance with therapy  Nutrition, hydration considering her dysphagia.  Following platelets and counts serially.  Considering hydroxyurea initiation tomorrow   Bowel/Bladder   continent of B/B LBM 03/31  remain continent of B/B normal bowel pattern  assist BR prn laxatives prn    Swallow/Nutrition/ Hydration   Min with current diet  Supervision  trials of upgraded diet textures   ADL's   min A functional transfers, mod A UB and LB self care, L inattention, diplopia  supervision overall  NMR, self care retraining, balance, functional mobility, pt/family education   Mobility     min assist currently   supervision overall with SBQC     Communication   decreased speech intelligibility at word level  Min A  use of speech intelligibility strategies   Safety/Cognition/ Behavioral Observations            Pain   pt denies any pain this evening  free of pain  assess pain Qshift and prn medicate prn assess for relief   Skin   skin intact with ecchymois bilateral arms  skin remain intact  assess skin qshift and prn      *See Care Plan and progress notes for long and short-term goals.     Barriers to Discharge  Current Status/Progress Possible Resolutions Date Resolved   Physician    Medical stability        See medical problem list on progress note      Nursing                  PT  OT Other (comments)  none known at this time             SLP                SW                Discharge Planning/Teaching Needs:  Home with granddaughter who is staying ehre to interpret for her. Granddaughter is a PT and can assist at DC      Team Discussion:  Goals supervision level trials of Dys 3 currently on nectar thick liquids with water protocol. MD checking BP and adjusting meds, also encouraging fluids for labs. Granddaughter checked off for transfers.  Making progressing her therapies.   Revisions to Treatment Plan:  DC 4/9    Continued Need for Acute Rehabilitation Level of Care: The patient requires daily medical management by a physician with specialized training in physical medicine and rehabilitation for the following conditions: Daily direction of a multidisciplinary physical rehabilitation program to ensure safe treatment while eliciting the highest outcome that is of practical value to the patient.: Yes Daily medical management of patient stability for increased activity during participation in an intensive rehabilitation regime.: Yes Daily analysis of laboratory values and/or radiology reports with any subsequent need for medication adjustment of medical intervention for : Neurological problems;Other   I attest that I was present, lead the team conference, and concur with the assessment and plan of the team.   Elease Hashimoto 02/01/2019, 9:25 AM

## 2019-01-31 NOTE — Progress Notes (Signed)
[place] PHYSICAL MEDICINE & REHABILITATION PROGRESS NOTE   Subjective/Complaints: Pt up in chair. In good spirits. Waiting for breakfast. Granddaughter states she's eating well and taking in nectars  ROS: limited by language somewhat  Objective:   No results found. Recent Labs    01/29/19 0736 01/30/19 0704  WBC 9.4 10.0  HGB 15.0 14.1  HCT 48.1* 45.4  PLT 894* 871*   Recent Labs    01/29/19 0736 01/30/19 0704  NA 140 143  K 4.0 4.0  CL 111 110  CO2 24 23  GLUCOSE 95 92  BUN 24* 24*  CREATININE 1.05* 1.01*  CALCIUM 8.5* 8.6*    Intake/Output Summary (Last 24 hours) at 01/31/2019 0919 Last data filed at 01/31/2019 0700 Gross per 24 hour  Intake 288 ml  Output -  Net 288 ml     Physical Exam: Vital Signs Blood pressure (!) 144/95, pulse 94, temperature 98.7 F (37.1 C), temperature source Oral, resp. rate (!) 21, SpO2 (!) 88 %.   Constitutional: No distress . Vital signs reviewed. HEENT: EOMI, oral membranes moist Neck: supple Cardiovascular: RRR without murmur. No JVD    Respiratory: CTA Bilaterally without wheezes or rales. Normal effort    GI: BS +, non-tender, non-distended  Neurological: She isalertand oriented to person, place, and time. granddaughter translated again. Pt followed all basic commands. Reasonable insight and awareness. RUE 4/5. LUE   4-/5. RLE 4/5. LLE 3+ to 4/5 prox to distal. Senses pain and LT in all 4's--stood with handheld assistance today from chair Skin: Skin iswarmand dry.  Psychiatric: pleasant    Assessment/Plan: 1. Functional deficits secondary to right PLIC, caudate, parietal infarcts which require 3+ hours per day of interdisciplinary therapy in a comprehensive inpatient rehab setting.  Physiatrist is providing close team supervision and 24 hour management of active medical problems listed below.  Physiatrist and rehab team continue to assess barriers to discharge/monitor patient progress toward functional and  medical goals  Care Tool:  Bathing    Body parts bathed by patient: Right arm, Left arm, Chest, Abdomen, Front perineal area, Right upper leg, Left upper leg, Face   Body parts bathed by helper: Buttocks, Right lower leg, Left lower leg     Bathing assist Assist Level: Moderate Assistance - Patient 50 - 74%     Upper Body Dressing/Undressing Upper body dressing Upper body dressing/undressing activity did not occur (including orthotics): Safety/medical concerns What is the patient wearing?: Pull over shirt    Upper body assist Assist Level: Moderate Assistance - Patient 50 - 74%    Lower Body Dressing/Undressing Lower body dressing      What is the patient wearing?: Incontinence brief     Lower body assist Assist for lower body dressing: Moderate Assistance - Patient 50 - 74%     Toileting Toileting    Toileting assist Assist for toileting: Minimal Assistance - Patient > 75%     Transfers Chair/bed transfer  Transfers assist     Chair/bed transfer assist level: Minimal Assistance - Patient > 75%     Locomotion Ambulation   Ambulation assist      Assist level: Minimal Assistance - Patient > 75% Assistive device: Walker-rolling Max distance: 50'   Walk 10 feet activity   Assist     Assist level: Minimal Assistance - Patient > 75% Assistive device: Walker-rolling   Walk 50 feet activity   Assist    Assist level: Minimal Assistance - Patient > 75% Assistive device: Walker-rolling  Walk 150 feet activity   Assist Walk 150 feet activity did not occur: Safety/medical concerns(decreased activity tolerance)         Walk 10 feet on uneven surface  activity   Assist     Assist level: Moderate Assistance - Patient - 50 - 74% Assistive device: Aeronautical engineer Will patient use wheelchair at discharge?: Yes Type of Wheelchair: Manual    Wheelchair assist level: Minimal Assistance - Patient > 75% Max  wheelchair distance: 50'    Wheelchair 50 feet with 2 turns activity    Assist        Assist Level: Minimal Assistance - Patient > 75%   Wheelchair 150 feet activity     Assist Wheelchair 150 feet activity did not occur: Safety/medical concerns(decreased activity tolerance)        Medical Problem List and Plan: 1.Left side weakness with dysarthria/dysphagiasecondary to acute infarctinthe right posterior limb of internal capsule, caudate body,and right parietal cortex. Plan 30 day event monitor on outpatient. Aspirin Plavix 3 weeks then Plavix alone --Continue CIR therapies including PT, OT, and SLP   2. Antithrombotics: -DVT/anticoagulation:Subcutaneous Lovenox -antiplatelet therapy: Aspirin 81 mg daily, Plavix 75 mg daily 3. Pain Management:Tylenol as needed 4. Mood:Provide emotional support, granddaughter supportive -antipsychotic agents: N/A 5. Neuropsych: This patientiscapable of making decisions on herown behalf. 6. Skin/Wound Care:Routine skin checks 7. Fluids/Electrolytes/Nutrition:        -BUN sl elevated- 24 3/31. Pushing fluids, spoke with pt/family 8. Dysphagia. Dysphagia #1 nectar liquids. Follow-up speech therapy 9. Hyperlipidemia. Lipitor 10. Thrombocytosis/polycythemia, likely MPN  -platelets in the 800k's -Plan is currently to follow-up outpatient for ongoing care(seen at [facility]). Her normal platelet count is between 800 and 100k. -Plan to begin Hydrea 500 mg daily once swallow function improves  -if BUN/Cr reasonable tomorrow, I will consider starting---granddaughter aware that we need to keep her hydrated, does well with her -pt received 1/2 unit blood phlebotomy prior to discharge to rehab 3/31 11. History of hypertension. Patient on Norvasc 2.5 mg daily prior to admission.  -Resumed  3/31    LOS: 2 days A FACE TO FACE EVALUATION WAS  PERFORMED  Meredith Staggers 01/31/2019, 9:19 AM

## 2019-01-31 NOTE — Plan of Care (Signed)
Due to the current state of emergency, patients may not be receiving their 3-hours of Medicare-mandated therapy.   

## 2019-01-31 NOTE — Progress Notes (Signed)
Speech Language Pathology Daily Session Note  Patient Details  Name: Jill Castaneda MRN: [id number] Date of Birth: 25-Dec-1927  Today's Date: 01/31/2019 SLP Individual Time: 1430-1510 SLP Individual Time Calculation (min): 40 min  Short Term Goals: Week 1: SLP Short Term Goal 1 (Week 1): STG=LTG due to short ELOS  Skilled Therapeutic Interventions:  Skilled treatment session focused on dysphagia goals and education. SLP facilitated session by providing skilled observation of pt consuming small sips of thin liquids via straw. Pt able to use straw but exhibit immediate cough suggestive of inability to establish airway protection. With small sips via cup rim, pt continued to exhibit cough. MBS imaging was reviewed with pt's grand-daughter. The imaging shows decreased oral containment of bolus with premature spillage that results in aspiration. SLP further facilitated by having pt perform oral hold then swallow. Pt able to perform oral hold consistently and this reduced overt s/s from coughing on every sip to cough in 1 out of 10 with oral hold. Education provided to pt's grand-daughter to have pt continually sip on nectar thick liquids with oral hold to habituate strategy. White coating observed on pt's tongue, nursing notified. All question answered to granddaughter's satisfaction.      Pain Pain Assessment Pain Scale: 0-10 Pain Score: 0-No pain  Therapy/Group: Individual Therapy  Eilan Mcinerny 01/31/2019, 3:30 PM

## 2019-01-31 NOTE — Progress Notes (Signed)
Occupational Therapy Session Note  Patient Details  Name: Jill Castaneda MRN: [id number] Date of Birth: 04-04-1928  Today's Date: 01/31/2019 OT Individual Time: 1300-1345 OT Individual Time Calculation (min): 45 min    Short Term Goals: Week 1:  OT Short Term Goal 1 (Week 1): STGs=LTGs secondary to estimated short LOS  Skilled Therapeutic Interventions/Progress Updates:    1:1. Pt received in couch with granddaughter present to translate. Pt completes all ambulation with min HHA and min manual facilitation of weigh shifitng R with VC for larger step through pattern on LLE and looking forward. Pt completes couch and TTB transfer with min HHA. granddaughter and pt educated on adaptations of shower using TTB with curtain and non skid strips on bottom of tub. Pt completes dynamic reaching activity in kitchen with pantry items placed on table and countertop pt needed to step and reach to place back in pantry. Pt demo difficulty with visio-spatial awareness in pantry doorway being close to wall with extremely wide BOS. Pt requires VC for narrowing stance and backing up. Pt completes seated pipe tree activity with PVC for motor control and min HOH A d/t R tremor. Pt demo difficulty referring to picture and locating items on L. Granddaughter was surprised at pts difficulties. Exited session with pt seated in couch, call light in reach anh all needs met  Therapy Documentation Precautions:  Precautions Precautions: Fall Restrictions Weight Bearing Restrictions: No General:   Vital Signs: Therapy Vitals Temp: 98.3 F (36.8 C) Pulse Rate: 90 Resp: 19 BP: 128/79 Oxygen Therapy SpO2: 91 % Pain: Pain Assessment Pain Scale: 0-10 Pain Score: 0-No pain ADL:   Vision   Perception    Praxis   Exercises:   Other Treatments:     Therapy/Group: Individual Therapy  Tonny Branch 01/31/2019, 4:13 PM

## 2019-02-01 ENCOUNTER — Inpatient Hospital Stay (HOSPITAL_COMMUNITY): Payer: Self-pay | Admitting: Physical Therapy

## 2019-02-01 ENCOUNTER — Inpatient Hospital Stay (HOSPITAL_COMMUNITY): Payer: Self-pay | Admitting: Occupational Therapy

## 2019-02-01 ENCOUNTER — Inpatient Hospital Stay (HOSPITAL_COMMUNITY): Payer: Self-pay | Admitting: Speech Pathology

## 2019-02-01 LAB — CBC
HCT: 42.6 % (ref 36.0–46.0)
Hemoglobin: 13.1 g/dL (ref 12.0–15.0)
MCH: 27 pg (ref 26.0–34.0)
MCHC: 30.8 g/dL (ref 30.0–36.0)
MCV: 87.7 fL (ref 80.0–100.0)
Platelets: 886 10*3/uL — ABNORMAL HIGH (ref 150–400)
RBC: 4.86 MIL/uL (ref 3.87–5.11)
RDW: 15.8 % — ABNORMAL HIGH (ref 11.5–15.5)
WBC: 9.2 10*3/uL (ref 4.0–10.5)
nRBC: 0 % (ref 0.0–0.2)

## 2019-02-01 LAB — BASIC METABOLIC PANEL
Anion gap: 3 — ABNORMAL LOW (ref 5–15)
BUN: 23 mg/dL (ref 8–23)
CO2: 27 mmol/L (ref 22–32)
Calcium: 8.5 mg/dL — ABNORMAL LOW (ref 8.9–10.3)
Chloride: 111 mmol/L (ref 98–111)
Creatinine, Ser: 0.96 mg/dL (ref 0.44–1.00)
GFR calc Af Amer: >60 mL/min (ref >60)
GFR calc non Af Amer: 52 mL/min — ABNORMAL LOW (ref >60)
Glucose, Bld: 94 mg/dL (ref 70–99)
Potassium: 3.8 mmol/L (ref 3.5–5.1)
Sodium: 141 mmol/L (ref 135–145)

## 2019-02-01 MED ORDER — HYDROXYUREA 500 MG PO CAPS
500.0000 mg | ORAL_CAPSULE | Freq: Every day | ORAL | Status: DC
  Administered 2019-02-01: 12:00:00 500 mg via ORAL
  Filled 2019-02-01 (×2): qty 1

## 2019-02-01 MED ORDER — HYDROCORTISONE ACETATE 25 MG RE SUPP
25.0000 mg | Freq: Two times a day (BID) | RECTAL | Status: AC
  Administered 2019-02-02 – 2019-02-04 (×6): 25 mg via RECTAL
  Filled 2019-02-01 (×3): qty 1

## 2019-02-01 MED ORDER — HYDROCORTISONE ACETATE 25 MG RE SUPP
25.0000 mg | Freq: Two times a day (BID) | RECTAL | Status: DC | PRN
  Administered 2019-02-04: 25 mg via RECTAL
  Filled 2019-02-01: qty 1

## 2019-02-01 NOTE — Progress Notes (Signed)
Occupational Therapy Session Note  Patient Details  Name: Jill Castaneda MRN: [id number] Date of Birth: 1928-04-07  Today's Date: 02/01/2019 OT Individual Time: 1030-1130 OT Individual Time Calculation (min): 60 min    Short Term Goals: Week 1:  OT Short Term Goal 1 (Week 1): STGs=LTGs secondary to estimated short LOS  Skilled Therapeutic Interventions/Progress Updates:    Upon entering the room, pt supine and sleeping soundly with granddaughter present in the room to translate this session. Pt agreeable to OT intervention and performed supine >sit with supervision. Pt ambulating 200' with SBQC placed in L UE secondary to R UE tremor with min A. Pt requiring cuing for forward head, posture, and sequencing. Pt engaged in dynavision task in standing x 2 for 3 minutes each time. Pt standing with close supervision and no LOB while utilizing L UE to hit targets on L side of board and R UE on R side of board. Pt visually scanning to L much slower with upper L quadrant being 2 seconds slower than R side. Pt standing on air ex foam to increase balance challenge with pt displaying posterior bias several times but able to self correct without LOB. Pt engaged in standing card game in which she was able to stand for 12 minutes with close supervision and engaged in B coordination task of shuffling and dealing cards to each player without drops. Pt utilzing L UE to flip cards and visual scanning to locate matches. Pt taking seated rest break and returning to room in same manner. Call bell and all needed items within reach upon exiting the room. Family member remained.  Therapy Documentation Precautions:  Precautions Precautions: Fall Restrictions Weight Bearing Restrictions: No   Therapy/Group: Individual Therapy  Gypsy Decant 02/01/2019, 12:09 PM

## 2019-02-01 NOTE — Progress Notes (Signed)
Speech Language Pathology Daily Session Note  Patient Details  Name: Jill Castaneda MRN: [id number] Date of Birth: 03/11/1928  Today's Date: 02/01/2019 SLP Individual Time: 1315-1400 SLP Individual Time Calculation (min): 45 min  Short Term Goals: Week 1: SLP Short Term Goal 1 (Week 1): STG=LTG due to short ELOS  Skilled Therapeutic Interventions:  Skilled treatment session focused on dysphagia, communication and education. SLP was made aware that pt coughed x 1 with nectar earlier in day. Therefore SLP provided skilled oobservation of pt consuming dysphagia 1 lunch tray with nectar thick liquids. Pt free of overt s/s of aspiration, therefore recommend continuing current diet. SLP further facilitated session by providing skilled observation of pt consuming trials of thin water via cup sips. Pt with difficulty coordinating oral hold with resultant coughing. Education provided to pt's grand-daughter on continuing to provide therapeutic trials by SP. Additionally, pt's language and speech intelligibility are much improved and is intelligible at the connected sentence level. Pt is able to express her wants and needs very well as well as converse with her grand-daughter. Pt left upright in bed with grand-daughter present. Continue per current plan of care.      Pain    Therapy/Group: Individual Therapy  Conswella Bruney 02/01/2019, 2:19 PM

## 2019-02-01 NOTE — Progress Notes (Signed)
Hematology brief note  I reviewed her lab and chart. She started hydrea today, 500mg  daily. Our oral pharmacist is working on Insurance underwriter for [employer], the application is in the process.  Her hemoglobin and platelet has been trending down, she had phlebotomy with half unit of blood removed last week at [facility].   Please repeat CBC in a week. Continue Hydrea. I plan to see her back in my clinic after discharge.   I left a message on her grandaughter Ana's phone, and encouraged her to call back if she has questions.   Truitt Merle  02/01/2019

## 2019-02-01 NOTE — IPOC Note (Signed)
Overall Plan of Care [facility]) Patient Details Name: Tiffany Calmes MRN: [id number] DOB: 08/13/1928  Admitting Diagnosis: <principal problem not specified>  Hospital Problems: Active Problems:   Cerebrovascular accident (CVA) of right basal ganglia ([place])     Functional Problem List: Nursing Endurance, Medication Management, Safety  PT Balance, Endurance, Motor, Perception, Safety, Skin Integrity  OT Balance, Endurance, Motor, Pain, Safety  SLP    TR         Basic ADL's: OT Eating, Grooming, Bathing, Dressing, Toileting     Advanced  ADL's: OT       Transfers: PT Bed Mobility, Bed to Chair, Car, Furniture, Futures trader, Metallurgist: PT Ambulation, Emergency planning/management officer, Stairs     Additional Impairments: OT Fuctional Use of Upper Extremity  SLP Swallowing, Communication expression    TR      Anticipated Outcomes Item Anticipated Outcome  Self Feeding supervision  Swallowing  Supervision A   Basic self-care  supervision  Toileting  supervision   Bathroom Transfers supervision  Bowel/Bladder     Transfers  supervision  Locomotion  supervision  Communication  Min A  Cognition     Pain     Safety/Judgment  manage with min assist   Therapy Plan: PT Intensity: Minimum of 1-2 x/day ,45 to 90 minutes PT Frequency: 5 out of 7 days PT Duration Estimated Length of Stay: 8-10 days OT Intensity: Minimum of 1-2 x/day, 45 to 90 minutes OT Frequency: 5 out of 7 days OT Duration/Estimated Length of Stay: 10-12 days SLP Intensity: Minumum of 1-2 x/day, 30 to 90 minutes SLP Frequency: 3 to 5 out of 7 days SLP Duration/Estimated Length of Stay: 7-10 days     Team Interventions: Nursing Interventions Patient/Family Education, Disease Management/Prevention, Medication Management, Discharge Planning  PT interventions Ambulation/gait training, Community reintegration, DME/adaptive equipment instruction, Neuromuscular re-education, Psychosocial  support, Stair training, UE/LE Strength taining/ROM, Wheelchair propulsion/positioning, Training and development officer, Discharge planning, Functional electrical stimulation, Pain management, Skin care/wound management, Therapeutic Activities, UE/LE Coordination activities, Cognitive remediation/compensation, Disease management/prevention, Functional mobility training, Patient/family education, Splinting/orthotics, Therapeutic Exercise, Visual/perceptual remediation/compensation  OT Interventions Balance/vestibular training, Neuromuscular re-education, Self Care/advanced ADL retraining, Therapeutic Exercise, Wheelchair propulsion/positioning, Cognitive remediation/compensation, DME/adaptive equipment instruction, Pain management, UE/LE Strength taining/ROM, Academic librarian, Barrister's clerk education, UE/LE Coordination activities, Discharge planning, Functional mobility training, Psychosocial support, Therapeutic Activities  SLP Interventions Cueing hierarchy, Dysphagia/aspiration precaution training, Functional tasks, Patient/family education  TR Interventions    SW/CM Interventions Discharge Planning, Psychosocial Support, Patient/Family Education   Barriers to Discharge MD  Medical stability  Nursing      PT      OT Other (comments) none known at this time  SLP      SW       Team Discharge Planning: Destination: PT-Home ,OT- Home , SLP-Home Projected Follow-up: PT-Home health PT, Outpatient PT, OT-  Home health OT, 24 hour supervision/assistance, SLP-24 hour supervision/assistance, Home Health SLP Projected Equipment Needs: PT-To be determined, OT- Tub/shower bench, SLP-None recommended by SLP Equipment Details: PT-Patient has a RW, cane, and transport chair, OT-  Patient/family involved in discharge planning: PT- Family Midwife, Patient,  OT-Family Midwife, SLP-Patient, Family member/caregiver  MD ELOS: 8-10 days Medical Rehab Prognosis:  Excellent Assessment:  The patient has been admitted for CIR therapies with the diagnosis of right basal ganglia infarct. The team will be addressing functional mobility, strength, stamina, balance, safety, adaptive techniques and equipment, self-care, bowel and bladder mgt, patient and caregiver education, NMR, swallowing, communication, cognition ,  community reentry. Goals have been set at supervision for self-care, mobility and sup/min assist with swallowing/cognition.    Meredith Staggers, MD, FAAPMR  p    See Team Conference Notes for weekly updates to the plan of care

## 2019-02-01 NOTE — Progress Notes (Signed)
PHYSICAL MEDICINE & REHABILITATION PROGRESS NOTE   Subjective/Complaints: Pt up in chair. In good spirits. Waiting for breakfast. Granddaughter states she's eating well and taking in nectars  ROS: limited by language somewhat  Objective:   No results found. Recent Labs    01/30/19 0704 02/01/19 0357  WBC 10.0 9.2  HGB 14.1 13.1  HCT 45.4 42.6  PLT 871* 886*   Recent Labs    01/30/19 0704 02/01/19 0357  NA 143 141  K 4.0 3.8  CL 110 111  CO2 23 27  GLUCOSE 92 94  BUN 24* 23  CREATININE 1.01* 0.96  CALCIUM 8.6* 8.5*   No intake or output data in the 24 hours ending 02/01/19 1011   Physical Exam: Vital Signs Blood pressure 137/85, pulse 64, temperature 99.1 F (37.3 C), temperature source Oral, resp. rate 18, height 4\' 8"  (1.422 m), weight 45.1 kg, SpO2 92 %.   Constitutional: No distress . Vital signs reviewed. HEENT: EOMI, oral membranes moist Neck: supple Cardiovascular: RRR without murmur. No JVD    Respiratory: CTA Bilaterally without wheezes or rales. Normal effort    GI: BS +, non-tender, non-distended  Neurological: She isalertand oriented to person, place, and time. granddaughter translated again. Pt followed all basic commands. Reasonable insight and awareness. RUE 4/5. LUE   4-/5. RLE 4/5. LLE 3+ to 4/5 prox to distal. Senses pain and LT in all 4's--stood with handheld assistance today from chair Skin: Skin iswarmand dry.  Psychiatric: pleasant    Assessment/Plan: 1. Functional deficits secondary to right PLIC, caudate, parietal infarcts which require 3+ hours per day of interdisciplinary therapy in a comprehensive inpatient rehab setting.  Physiatrist is providing close team supervision and 24 hour management of active medical problems listed below.  Physiatrist and rehab team continue to assess barriers to discharge/monitor patient progress toward functional and medical goals  Care Tool:  Bathing    Body parts bathed by  patient: Right arm, Left arm, Chest, Abdomen, Front perineal area, Right upper leg, Left upper leg, Face   Body parts bathed by helper: Buttocks, Right lower leg, Left lower leg     Bathing assist Assist Level: Moderate Assistance - Patient 50 - 74%     Upper Body Dressing/Undressing Upper body dressing Upper body dressing/undressing activity did not occur (including orthotics): Safety/medical concerns What is the patient wearing?: Pull over shirt    Upper body assist Assist Level: Moderate Assistance - Patient 50 - 74%    Lower Body Dressing/Undressing Lower body dressing      What is the patient wearing?: Incontinence brief     Lower body assist Assist for lower body dressing: Moderate Assistance - Patient 50 - 74%     Toileting Toileting    Toileting assist Assist for toileting: Minimal Assistance - Patient > 75%     Transfers Chair/bed transfer  Transfers assist     Chair/bed transfer assist level: Minimal Assistance - Patient > 75%     Locomotion Ambulation   Ambulation assist      Assist level: Minimal Assistance - Patient > 75% Assistive device: Cane-quad Max distance: 160   Walk 10 feet activity   Assist     Assist level: Minimal Assistance - Patient > 75% Assistive device: Cane-quad   Walk 50 feet activity   Assist    Assist level: Minimal Assistance - Patient > 75% Assistive device: Cane-quad    Walk 150 feet activity   Assist Walk 150 feet activity did not  occur: Safety/medical concerns(decreased activity tolerance)  Assist level: Minimal Assistance - Patient > 75% Assistive device: Cane-quad    Walk 10 feet on uneven surface  activity   Assist     Assist level: Moderate Assistance - Patient - 50 - 74% Assistive device: Aeronautical engineer Will patient use wheelchair at discharge?: Yes Type of Wheelchair: Manual    Wheelchair assist level: Minimal Assistance - Patient > 75% Max wheelchair  distance: 50'    Wheelchair 50 feet with 2 turns activity    Assist        Assist Level: Minimal Assistance - Patient > 75%   Wheelchair 150 feet activity     Assist Wheelchair 150 feet activity did not occur: Safety/medical concerns(decreased activity tolerance)        Medical Problem List and Plan: 1.Left side weakness with dysarthria/dysphagiasecondary to acute infarctinthe right posterior limb of internal capsule, caudate body,and right parietal cortex. Plan 30 day event monitor on outpatient. Aspirin Plavix 3 weeks then Plavix alone ---Continue CIR therapies including PT, OT, and SLP  2. Antithrombotics: -DVT/anticoagulation:Subcutaneous Lovenox -antiplatelet therapy: Aspirin 81 mg daily, Plavix 75 mg daily 3. Pain Management:Tylenol as needed 4. Mood:Provide emotional support, granddaughter supportive -antipsychotic agents: N/A 5. Neuropsych: This patientiscapable of making decisions on herown behalf. 6. Skin/Wound Care:Routine skin checks 7. Fluids/Electrolytes/Nutrition:    -intake reasonable, granddaughter helps   -labs reviewed, BUN stable at 23/0.96 today 8. Dysphagia. Dysphagia #1 nectar liquids. Advance per SLP 9. Hyperlipidemia. Lipitor 10. Thrombocytosis/polycythemia, likely MPN  -platelets in the 800k's -Plan is currently to follow-up outpatient for ongoing care(seen at [facility]). Her normal platelet count is between 800 and 100k. -intake is reasonable and g-daughter helps--will go ahead and start hydroxyurea 500mg  daily paying close attention to intake and renal function   -gdaughter will push fluids -pt received 1/2 unit blood phlebotomy prior to discharge to rehab 3/31 11. History of hypertension. Patient on Norvasc 2.5 mg daily prior to admission.  -Resumed  3/31 with improvement    LOS: 3 days A FACE TO FACE EVALUATION WAS PERFORMED  Meredith Staggers 02/01/2019, 10:11 AM

## 2019-02-01 NOTE — Progress Notes (Signed)
Physical Therapy Session Note  Patient Details  Name: Jill Castaneda MRN: [id number] Date of Birth: 03/11/1928  Today's Date: 02/01/2019 PT Individual Time: 0830-0930 AND 1600-1700 PT Individual Time Calculation (min): 60 min   Short Term Goals: Week 1:  PT Short Term Goal 1 (Week 1): Patient will perform sitting balance without UE support for >2 minutes while maintaining midline without cuing. PT Short Term Goal 2 (Week 1): Patient will perform transfers from various household surfaces and car transfers with CGA for safety and balance. PT Short Term Goal 3 (Week 1): Patient will ambulate >49' with CGA for safety using LRAD. PT Short Term Goal 4 (Week 1): Patient will go up and down 4 steps using 1 rail and/or LRAD with CGA for safety and balance.  Skilled Therapeutic Interventions/Progress Updates:  Session 1 Pt received supine in bed and agreeable to PT. Supine>sit transfer with CGA from daughter. Daughter providing min cues for posture and use of RUE. Sit<>stand from bed with CGA and QC.   Gait training in hall with [facility] to rehab gym with min assist from PT and min cues for gait pattern 2 x 135f. Pt progressed to [place] of each bout of gait training and improved 3 point gait training.   PT instructed pt in dynamic balance training:  Reciprocal foot taps on 4 inch step 2 x 10 BLE with min assist to prevent posterior LOB.  Standing on red wedge while performing dual task to complete low difficulty peg board puzzle. X 10 minutes with CGA-min assist to prevent posterior LOB and improve use of ankle strategy.  Standing on airex pad and performing lateral reach and fine motor task to stack blocks 2 x 10 Bil with CGA and min cues for improved weight shift to the R and attention to the L Sit<>stand throughout session with CGA and no UE support from PT with min cues set up by PT.      [facility] returned to [facility] and [facility] in reach and all needs met.     Session 2.   Pt received sitting in recliner and agreeable to PT. Sit>stand with CGA from PT for safety and HHA. Gait training inhall of rehab unit with HHA x 1215fand with HHA on the L in shoulder flexion>90 degrees.  Additional gait training with RW and min assist x 8042f  Nustep reciprocal movement training x 6 minutes with BLE and BUE x 7 minutes with cues for symmetry.   Dynamic gait training to step over 1 inch obstales with min assist.  Weave around cones 4x4 with HHA  Pt noted to have poor weight shift R and inappropriate movent patterns with stepping over obstacles and weaving L foot around cone. Min-mod cues for improved gait pattern and posture.   HHA gait training through hall x 200f42fth min assist at trunk to improve midline orientation, significantly improved step through on the L. Patient returned to room and [facility] in reach and all needs met.           Therapy Documentation Precautions:  Precautions Precautions: Fall Restrictions Weight Bearing Restrictions: No Pain: Denies.    Therapy/Group: Individual Therapy  Jill Castaneda/2020, 9:33 AM

## 2019-02-02 ENCOUNTER — Inpatient Hospital Stay (HOSPITAL_COMMUNITY): Payer: Self-pay | Admitting: Occupational Therapy

## 2019-02-02 ENCOUNTER — Inpatient Hospital Stay (HOSPITAL_COMMUNITY): Payer: Self-pay | Admitting: Speech Pathology

## 2019-02-02 ENCOUNTER — Inpatient Hospital Stay (HOSPITAL_COMMUNITY): Payer: Self-pay | Admitting: Physical Therapy

## 2019-02-02 MED ORDER — HYDROXYUREA 500 MG PO CAPS
500.0000 mg | ORAL_CAPSULE | Freq: Every day | ORAL | Status: DC
  Administered 2019-02-04: 500 mg via ORAL
  Filled 2019-02-02 (×3): qty 1

## 2019-02-02 NOTE — Progress Notes (Signed)
Physical Therapy Session Note  Patient Details  Name: Jill Castaneda MRN: [id number] Date of Birth: 1928/08/21  Today's Date: 02/02/2019 PT Individual Time: 1005-1105 PT Individual Time Calculation (min): 60 min   Short Term Goals: Week 1:  PT Short Term Goal 1 (Week 1): Patient will perform sitting balance without UE support for >2 minutes while maintaining midline without cuing. PT Short Term Goal 2 (Week 1): Patient will perform transfers from various household surfaces and car transfers with CGA for safety and balance. PT Short Term Goal 3 (Week 1): Patient will ambulate >65' with CGA for safety using LRAD. PT Short Term Goal 4 (Week 1): Patient will go up and down 4 steps using 1 rail and/or LRAD with CGA for safety and balance.  Skilled Therapeutic Interventions/Progress Updates:    Pt received sitting EOB and agreeable to PT  Gait training in hall with HHA and min assist from PT. Pt noted to have shoes with wedge in the LLE, and noted improvement with R weight shift and consistent advancement of the LLE.  Dynamic balance training: Lateral reaches to the R to place and remove 10 clothes pins from elevated backetball goals. 1# ankle weight on R wrist to improve coordination of movement. Biodex COM control: lateral/anterior weight shifts 2 x 1 min each. LOS x 4. Min-mod cues for improved use of ankle strategy to correct weight shift. Pt noted to have decreased weight shift to the R which increased with fatigue.   Gait training with various AD: SPC x 61f with min-mod assist and mod-max cues for AD management and gait pattern with SCP in the LUE. walking stick x 550fwith mod assist to prevent L LOB and moderate cues for weight shift and gait pattern, Rollator x [id number] CGA and min cues for posture, midline orientation as well as step length on the L. Pt noted to have greatly improved gait pattern on the L with rollator as well as improved midline orientation with rollator   compared to SPC,[facility] or walking stick.   Patient returned to room and left [facility] reach and all needs met.           Therapy Documentation Precautions:  Precautions Precautions: Fall Restrictions Weight Bearing Restrictions: No   Pain:   denies    Therapy/Group: Individual Therapy  AustLorie Phenix/2020, 12:36 PM

## 2019-02-02 NOTE — Plan of Care (Signed)
Late entry long term goals added for supervision assist with LRAD overall. See care plan.     Barrie Folk PT, DPT

## 2019-02-02 NOTE — Progress Notes (Signed)
[place] PHYSICAL MEDICINE & REHABILITATION PROGRESS NOTE   Subjective/Complaints: Pt up in chair again. Having discomfort LUQ, +/- nausea since yesterday afternoon.   ROS: Patient denies fever, rash, sore throat, blurred vision,   vomiting, diarrhea, cough, shortness of breath or chest pain, joint or back pain, headache, or mood change.    Objective:   No results found. Recent Labs    02/01/19 0357  WBC 9.2  HGB 13.1  HCT 42.6  PLT 886*   Recent Labs    02/01/19 0357  NA 141  K 3.8  CL 111  CO2 27  GLUCOSE 94  BUN 23  CREATININE 0.96  CALCIUM 8.5*    Intake/Output Summary (Last 24 hours) at 02/02/2019 1052 Last data filed at 02/02/2019 0900 Gross per 24 hour  Intake 478 ml  Output -  Net 478 ml     Physical Exam: Vital Signs Blood pressure (!) 146/82, pulse 62, temperature 98.6 F (37 C), temperature source Oral, resp. rate 16, height 4\' 8"  (1.422 m), weight 45.1 kg, SpO2 92 %.   Constitutional: No distress . Vital signs reviewed. HEENT: EOMI, oral membranes moist Neck: supple Cardiovascular: RRR without murmur. No JVD    Respiratory: CTA Bilaterally without wheezes or rales. Normal effort    GI: BS +, tenderness to palpation in LUQ, no rib discomfort Neurological: She isalertand oriented to person, place,  Pt followed all basic commands with translation.  RUE 4/5. LUE   4-/5. RLE 4/5. LLE 3+ to 4/5 prox to distal. Senses pain and LT in all 4's-- good sitting balance Skin: Skin iswarmand dry.  Psychiatric: smiling, pleasant    Assessment/Plan: 1. Functional deficits secondary to right PLIC, caudate, parietal infarcts which require 3+ hours per day of interdisciplinary therapy in a comprehensive inpatient rehab setting.  Physiatrist is providing close team supervision and 24 hour management of active medical problems listed below.  Physiatrist and rehab team continue to assess barriers to discharge/monitor patient progress toward functional and  medical goals  Care Tool:  Bathing    Body parts bathed by patient: Right arm, Left arm, Chest, Abdomen, Front perineal area, Right upper leg, Left upper leg, Face   Body parts bathed by helper: Buttocks, Right lower leg, Left lower leg     Bathing assist Assist Level: Moderate Assistance - Patient 50 - 74%     Upper Body Dressing/Undressing Upper body dressing Upper body dressing/undressing activity did not occur (including orthotics): Safety/medical concerns What is the patient wearing?: Pull over shirt    Upper body assist Assist Level: Moderate Assistance - Patient 50 - 74%    Lower Body Dressing/Undressing Lower body dressing      What is the patient wearing?: Incontinence brief     Lower body assist Assist for lower body dressing: Moderate Assistance - Patient 50 - 74%     Toileting Toileting    Toileting assist Assist for toileting: Minimal Assistance - Patient > 75%     Transfers Chair/bed transfer  Transfers assist     Chair/bed transfer assist level: Contact Guard/Touching assist     Locomotion Ambulation   Ambulation assist      Assist level: Minimal Assistance - Patient > 75% Assistive device: Cane-quad Max distance: 200'   Walk 10 feet activity   Assist     Assist level: Minimal Assistance - Patient > 75% Assistive device: Cane-quad   Walk 50 feet activity   Assist    Assist level: Minimal Assistance - Patient >  75% Assistive device: Cane-quad    Walk 150 feet activity   Assist Walk 150 feet activity did not occur: Safety/medical concerns(decreased activity tolerance)  Assist level: Minimal Assistance - Patient > 75% Assistive device: Cane-quad    Walk 10 feet on uneven surface  activity   Assist     Assist level: Moderate Assistance - Patient - 50 - 74% Assistive device: Aeronautical engineer Will patient use wheelchair at discharge?: Yes Type of Wheelchair: Manual    Wheelchair  assist level: Minimal Assistance - Patient > 75% Max wheelchair distance: 50'    Wheelchair 50 feet with 2 turns activity    Assist        Assist Level: Minimal Assistance - Patient > 75%   Wheelchair 150 feet activity     Assist Wheelchair 150 feet activity did not occur: Safety/medical concerns(decreased activity tolerance)        Medical Problem List and Plan: 1.Left side weakness with dysarthria/dysphagiasecondary to acute infarctinthe right posterior limb of internal capsule, caudate body,and right parietal cortex. Plan 30 day event monitor on outpatient. Aspirin Plavix 3 weeks then Plavix alone ---Continue CIR therapies including PT, OT, and SLP  2. Antithrombotics: -DVT/anticoagulation:Subcutaneous Lovenox -antiplatelet therapy: Aspirin 81 mg daily, Plavix 75 mg daily 3. Pain Management:Tylenol as needed 4. Mood:Provide emotional support, granddaughter supportive -antipsychotic agents: N/A 5. Neuropsych: This patientiscapable of making decisions on herown behalf. 6. Skin/Wound Care:Routine skin checks 7. Fluids/Electrolytes/Nutrition:    -intake reasonable, granddaughter helps   -  BUN stable at 23/0.96 4/2  -recheck bmet Monday  8. Dysphagia. Dysphagia #1 nectar liquids. Advance per SLP 9. Hyperlipidemia. Lipitor 10. Thrombocytosis/polycythemia, likely MPN  -platelets in the 800k's -Plan is currently to follow-up outpatient for ongoing care(seen at [facility]). Her normal platelet count is between 800 and 100k. -given GI sx, I have held hydroxyurea until Sunday to see if symptoms resolve and if resuming triggers them again.    -500mg  is lowest dose available at hospital   -gdaughter pushing fluids -pt received 1/2 unit blood phlebotomy prior to discharge to rehab 3/31  -recheck CBC monday 11. History of hypertension. Patient on Norvasc 2.5 mg daily prior to  admission.  -Resumed  3/31 with improvement    LOS: 4 days A FACE TO FACE EVALUATION WAS PERFORMED  Meredith Staggers 02/02/2019, 10:52 AM

## 2019-02-02 NOTE — Telephone Encounter (Signed)
Oral Oncology Patient Advocate Encounter  I faxed the completed application today to [phone] and received a successful fax notification.  This encounter will be updated until final determination.  [place] [facility] [place] Phone [phone] Fax [phone] 02/02/2019   8:25 AM

## 2019-02-02 NOTE — Progress Notes (Signed)
Occupational Therapy Session Note  Patient Details  Name: Jill Castaneda MRN: [id number] Date of Birth: [birth year]  Today's Date: 02/02/2019 OT Individual Time: 8159-4707 and 6151-8343 OT Individual Time Calculation (min): 61 min and 42 min  Short Term Goals: Week 1:  OT Short Term Goal 1 (Week 1): STGs=LTGs secondary to estimated short LOS  Skilled Therapeutic Interventions/Progress Updates:    Pt greeted while sitting on couch. Granddaughter Jill Castaneda present to interpret. Pt had a little stomach pain this morning, but she reported this has absolved. She wanted to shower. Started with ambulatory transfer using [facility] with steady assist and vcs for sequencing. Per [place], pt does not plan to use the cane at home, and [place] verifies that they will not be using it. Therefore, for remainder of tx, pt completed bathroom transfers with Min A HHA on Lt side. She completed bathing (shower level, sit<stand) and dressing (sit<stand from standard toilet) during session. Pt initiated using Lt hand to wash Rt side and lather hair with shampoo. Assist required for managing soap container due to tremors. Lt hand held onto grab bar when utilizing figure 4 to wash Lt foot. Steady assist for sit<stands with heavy grab bar reliance. Pt with posterior lean in standing during perihygiene completion, requiring verbal and manual cues to correct. She also exhibited Lt lean during static/dynamic sitting. Able to correct for short windows of time with verbal cuing. When dressing, placed clothing on Lt side for scanning. She reached for needed for shirt, pants, and sock package using L UE. Min A for dynamic sitting and standing balance. Able to button 3/4 shirt buttons with increased time and encouragement! Discussed with Jill Castaneda methods for facilitating Lt hand use and Lt scanning during ADL/IADL tasks at home. At end of session Jill Castaneda provided HHA while pt ambulated back to bed. She was left with Jill Castaneda at session exit.   2nd Session 1:1 tx (42  min) Pt greeted while sitting on couch. Jill Castaneda present to interpret. No c/o pain and ADL needs met. Worked on dynamic balance and Lt NMR during IADL task of bedmaking. Pt ambulated with HHA to retrieve fitted sheet. Steady assist for dynamic balance when bending outside of base of support to apply sheet. Vcs required for widening base when sidestepping. She exhibited Lt lean when standing to don/doff pillowcases. Manual and verbal cuing required to improve midline orientation. Pt then reported needing to use the bathroom. She ambulated with HHA into bathroom and completed clothing mgt with cuing. Pt with B+B incontinence in brief. Discussed with Jill Castaneda using pullups at home to address incontinence, as well as timed toileting scheduling. She verbalized understanding. At end of session, pt was left with Jill Castaneda to finish up perihygiene.  Therapy Documentation Precautions:  Precautions Precautions: Fall Restrictions Weight Bearing Restrictions: No Pain: No c/o pain during 2nd session    ADL:       Therapy/Group: Individual Therapy  Tniyah Nakagawa A Macy Polio 02/02/2019, 12:11 PM

## 2019-02-02 NOTE — Progress Notes (Signed)
Speech Language Pathology Daily Session Note  Patient Details  Name: Jill Castaneda MRN: [id number] Date of Birth: [birth year]  Today's Date: 02/02/2019   Skilled treatment session #1 SLP Individual Time: 1130-1200 SLP Individual Time Calculation (min): 30 min  Short Term Goals: Week 1: SLP Short Term Goal 1 (Week 1): STG=LTG due to short ELOS  Skilled Therapeutic Interventions:  Skilled treatment session #1 focused on dysphagia and further education. As SLP entered room pt was coughing of liquid that her grand-daughter had attempted to thicken to nectar thick. Upon inspection, likely that liquid was not nectar thick as it needed more time to congeal. With facility nectar thick and Mod A cues to perform oral hold, pt with no overt s/s of aspiration. However when not performing oral hold, pt with multiple swallows per sip and throat clears suspicious of incomplete airway protection.  Additionally, grand-daughter states that pt usually coughed on first sip of liquid. Recommend pt perform multiple dry swallows prior to beginning consumption of nectar thick liquids.   Skilled treatment session #2 focused on dysphagia and further education. SLP facilitiated session by providing Mod A cues for oral hold when consuming nectar thick liquids. Pt with difficulty coordinating oral hold as she produced initial swallow prior to holding remainder of bolus. Pt with coughing throughout d/t incomplete oral hold of bolus. Chin tuck attempted to facilitate better bolus cohesion but also ineffective in preventing coughing. Sips of honey thick liquids were consumed without overt s/s of aspiration. Given concern for aspiration on previously recommended nectar thick liquids, recommend MBS to be perform to assess instrumentally on 02/05/19. All questions answered to grand-daughter's satisfaction.      Pain    Therapy/Group: Individual Therapy  [name] 02/02/2019, 12:45 PM

## 2019-02-03 ENCOUNTER — Inpatient Hospital Stay (HOSPITAL_COMMUNITY): Payer: Self-pay | Admitting: Physical Therapy

## 2019-02-03 ENCOUNTER — Inpatient Hospital Stay (HOSPITAL_COMMUNITY): Payer: Self-pay | Admitting: Occupational Therapy

## 2019-02-03 ENCOUNTER — Inpatient Hospital Stay (HOSPITAL_COMMUNITY): Payer: Self-pay

## 2019-02-03 DIAGNOSIS — I69391 Dysphagia following cerebral infarction: Secondary | ICD-10-CM

## 2019-02-03 DIAGNOSIS — R2689 Other abnormalities of gait and mobility: Secondary | ICD-10-CM

## 2019-02-03 NOTE — Progress Notes (Signed)
Granddaughter concern about left lower extremity ecchymotic area claims it is getting bigger; no warmth , swelling or  open areas noted. Continued to monitor.

## 2019-02-03 NOTE — Progress Notes (Signed)
Physical Therapy Session Note  Patient Details  Name: Jill Castaneda MRN: [id number] Date of Birth: 03/03/1928  Today's Date: 02/03/2019 PT Individual Time: 0800-0915 PT Individual Time Calculation (min): 75 min   Short Term Goals: Week 1:  PT Short Term Goal 1 (Week 1): Patient will perform sitting balance without UE support for >2 minutes while maintaining midline without cuing. PT Short Term Goal 2 (Week 1): Patient will perform transfers from various household surfaces and car transfers with CGA for safety and balance. PT Short Term Goal 3 (Week 1): Patient will ambulate >61' with CGA for safety using LRAD. PT Short Term Goal 4 (Week 1): Patient will go up and down 4 steps using 1 rail and/or LRAD with CGA for safety and balance.  Skilled Therapeutic Interventions/Progress Updates:   Pt received supine in bed and agreeable to PT. Supine>sit transfer with supervision assist and min cues for reciprocal scootin to EOB  Gait training with Rollator 2 x 122f with supervision assist from PT. Min cues for improved midline orientation, centering gait with rollator, and symmetrical step length R and L.   Dynamic balance training  reciprocal stepping to 4 inch step x 15 BLE; to 4 inch step with target for BLE  X 12.to 6 inch step x 12. Supervision -min assist from PT with cues for decreased speed and improved control of BLE. Noted improvement in weight shifted with decreased speed. Lateral Reaches R and L to toss bean bag to target x 20 Bil. Min cues for improve R weight shift and to return midline from the L. CGA from PT for safety and improved midline orientation.    Dynamic gait training Side stepping at rail  2x30 Bil with min assist from PT to prevent compensation and improve R weight shift to advance the LLE.  Forward/retrograde gait with Rollator 2 x 30 ft. Min assist from PT to control speed of Rollator and min cues for increased step length on the L and decreased force of foot contact.  With each step.  Weave through 10 cones x2 with Rollator CGA - supervision assist from PT and min cues for decreased force of Heel contact and improved step length on the L.   Patient returned to room and left sitting on couch with granddaughter; call bell in reach and all needs met.        Therapy Documentation Precautions:  Precautions Precautions: Fall Restrictions Weight Bearing Restrictions: No    Vital Signs: Therapy Vitals Temp: (!) 97.5 F (36.4 C) Temp Source: Axillary Pulse Rate: (!) 51 Resp: 18 BP: 139/76 Patient Position (if appropriate): Lying Oxygen Therapy SpO2: 92 % O2 Device: Room Air Pain: Denies    Therapy/Group: Individual Therapy  ALorie Phenix4/01/2019, 9:15 AM

## 2019-02-03 NOTE — Progress Notes (Signed)
Occupational Therapy Session Note  Patient Details  Name: Jill Castaneda MRN: [id number] Date of Birth: [birth year]  Today's Date: 02/03/2019 OT Individual Time: 1427-1540 OT Individual Time Calculation (min): 73 min   Short Term Goals: Week 1:  OT Short Term Goal 1 (Week 1): STGs=LTGs secondary to estimated short LOS  Skilled Therapeutic Interventions/Progress Updates:    Pt greeted in bed with Ana present to interpret. No c/o pain and amenable to tx. Started session with ambulatory toilet transfer using RW with vcs for locking brakes during sit<stand transitions. Ana assisted with toileting given min vcs for allowing pt to complete tasks at max level of independence. After handwashing, pt transferred to w/c. She was escorted to outdoor courtyard via w/c. Tx focus placed was then on bilateral coordination while engaging in meaningful task of crocheting. OT started her off with a small straight chain, and after, she was able to continue with chain while wearing 1# wrist weight and Ana steadying Rt hand. Provided her with built up crochet needle, however pt reported it was not helpful, and so it was removed. Encouraged pt to use close chained principles like elbows or wrists on table for minimizing tremor activity. Pt smiling and appearing to experience flow while crocheting. After, we worked on Management consultant while walking around courtyard and transferring to a Programme researcher, broadcasting/film/video. Pt required close supervision, vcs for slowing pace and Lt foot clearance while ambulating over uneven terrain. She also exhibited Lt veering at times and unable to recognize or correct without cuing. 1 small LOB with pt able to recover with steady assist. Max vcs for locking brakes during sit<stand transitions. At end of session pt was returned to room via w/c. Vcs for safety while ambulating with rollator to wash hands, and then to couch. Pt left with Ana at end of tx.   Therapy Documentation Precautions:   Precautions Precautions: Fall Restrictions Weight Bearing Restrictions: No Vital Signs: Therapy Vitals Temp: 98.3 F (36.8 C) Temp Source: Oral Pulse Rate: 68 Resp: 18 BP: 136/84 Patient Position (if appropriate): Lying Oxygen Therapy SpO2: 90 % O2 Device: Room Air Pain Assessment Pain Score: 0-No pain ADL:       Therapy/Group: Individual Therapy  Jessiah Wojnar A Shann Lewellyn 02/03/2019, 4:44 PM

## 2019-02-03 NOTE — Progress Notes (Signed)
Speech Language Pathology Daily Session Note  Patient Details  Name: Jill Castaneda MRN: [id number] Date of Birth: [birth year]  Today's Date: 02/03/2019 SLP Individual Time: 1140-1220 SLP Individual Time Calculation (min): 40 min  Short Term Goals: Week 1: SLP Short Term Goal 1 (Week 1): STG=LTG due to short ELOS  Skilled Therapeutic Interventions:Skilled ST services focused on swallow skills. SLP facilitated PO consumption of dys 2 trial tray, pt demonstrated increase mastication time, however appeared functional with appropriate oral clearance, timely swallow initiation and no overt s/s aspiration. Pt demonstrated recall of 1 out 3 swallow strategies, oral hold and SLP provided education on use of dry swallow and intermittent throat clear. SLP upgraded solid texture to dys 2. SLP facilitated use of swallow strategies with HTL via cup, pt required mod A verbal cues for oral hold, evident by need for multiple swallows with no overt s/s aspiration.  Pt is scheduled for MBS on 4/6 to assess swallow function. SLP provided education to [name], whom was interpreting, on dys 2 diet, all questions answered to satisfaction. Pt was left in room with call bell within reach, family in room and bed alarm set. ST recommends to continue skilled ST services.      Pain Pain Assessment Pain Score: 0-No pain  Therapy/Group: Individual Therapy  Jill Castaneda  [facility] 02/03/2019, 12:50 PM

## 2019-02-03 NOTE — Progress Notes (Signed)
[place] PHYSICAL MEDICINE & REHABILITATION PROGRESS NOTE   Subjective/Complaints: Patient seen sitting up in bed this morning eating breakfast.  Granddaughter at bedside.  She states that patient slept well overnight.  She mentions left upper quadrant tenderness.  Patient states this is improving.  Granddaughter notes balance and swallowing deficits with ambulation.  ROS: + LUQ tenderness, improving. Denies CP, SOB, N/V/D  Objective:   No results found. Recent Labs    02/01/19 0357  WBC 9.2  HGB 13.1  HCT 42.6  PLT 886*   Recent Labs    02/01/19 0357  NA 141  K 3.8  CL 111  CO2 27  GLUCOSE 94  BUN 23  CREATININE 0.96  CALCIUM 8.5*    Intake/Output Summary (Last 24 hours) at 02/03/2019 0947 Last data filed at 02/03/2019 0700 Gross per 24 hour  Intake 610 ml  Output -  Net 610 ml     Physical Exam: Vital Signs Blood pressure 139/76, pulse (!) 51, temperature (!) 97.5 F (36.4 C), temperature source Axillary, resp. rate 18, height 4\' 8"  (1.422 m), weight 45.1 kg, SpO2 92 %.  Constitutional: NAD.  Vital signs reviewed. HENT: Normocephalic.  Atraumatic. Eyes: EOMI.  No discharge. Cardiovascular: No JVD    Respiratory: Normal effort    GI: tenderness to palpation in LUQ, improving per patient Neurological: She isalertand oriented Motor: RLE 4-4+/5 proximal distal LLE 4-4+/5 prox to distal. Skin: Skin iswarmand dry.  Psychiatric: smiling, pleasant  Assessment/Plan: 1. Functional deficits secondary to right PLIC, caudate, parietal infarcts which require 3+ hours per day of interdisciplinary therapy in a comprehensive inpatient rehab setting.  Physiatrist is providing close team supervision and 24 hour management of active medical problems listed below.  Physiatrist and rehab team continue to assess barriers to discharge/monitor patient progress toward functional and medical goals  Care Tool:  Bathing    Body parts bathed by patient: Right arm, Left arm,  Chest, Abdomen, Front perineal area, Right upper leg, Left upper leg, Face, Right lower leg, Left lower leg, Buttocks   Body parts bathed by helper: Buttocks, Right lower leg, Left lower leg     Bathing assist Assist Level: Minimal Assistance - Patient > 75%     Upper Body Dressing/Undressing Upper body dressing Upper body dressing/undressing activity did not occur (including orthotics): Safety/medical concerns What is the patient wearing?: Button up shirt    Upper body assist Assist Level: Minimal Assistance - Patient > 75%    Lower Body Dressing/Undressing Lower body dressing      What is the patient wearing?: Incontinence brief, Pants     Lower body assist Assist for lower body dressing: Minimal Assistance - Patient > 75%     Toileting Toileting    Toileting assist Assist for toileting: Minimal Assistance - Patient > 75%     Transfers Chair/bed transfer  Transfers assist     Chair/bed transfer assist level: Supervision/Verbal cueing     Locomotion Ambulation   Ambulation assist      Assist level: Supervision/Verbal cueing Assistive device: Rollator Max distance: 160   Walk 10 feet activity   Assist     Assist level: Contact Guard/Touching assist Assistive device: Rollator   Walk 50 feet activity   Assist    Assist level: Contact Guard/Touching assist Assistive device: Rollator    Walk 150 feet activity   Assist Walk 150 feet activity did not occur: Safety/medical concerns(decreased activity tolerance)  Assist level: Supervision/Verbal cueing Assistive device: Rollator  Walk 10 feet on uneven surface  activity   Assist     Assist level: Moderate Assistance - Patient - 50 - 74% Assistive device: Aeronautical engineer Will patient use wheelchair at discharge?: Yes Type of Wheelchair: Manual    Wheelchair assist level: Minimal Assistance - Patient > 75% Max wheelchair distance: 50'    Wheelchair 50  feet with 2 turns activity    Assist        Assist Level: Minimal Assistance - Patient > 75%   Wheelchair 150 feet activity     Assist Wheelchair 150 feet activity did not occur: Safety/medical concerns(decreased activity tolerance)        Medical Problem List and Plan: 1.Left side weakness with dysarthria/dysphagiasecondary to acute infarctinthe right posterior limb of internal capsule, caudate body,and right parietal cortex. Plan 30 day event monitor on outpatient. Aspirin Plavix 3 weeks then Plavix alone  Continue CIR 2. Antithrombotics: -DVT/anticoagulation:Subcutaneous Lovenox -antiplatelet therapy: Aspirin 81 mg daily, Plavix 75 mg daily 3. Pain Management:Tylenol as needed 4. Mood:Provide emotional support, granddaughter supportive -antipsychotic agents: N/A 5. Neuropsych: This patientiscapable of making decisions on herown behalf. 6. Skin/Wound Care:Routine skin checks 7. Fluids/Electrolytes/Nutrition:    -intake reasonable, granddaughter helps   -BUN/creatinine within normal limits on 4/2  -Labs ordered for Monday   8. Dysphagia. Dysphagia #1 nectar liquids.  Advanced to D2 honey, continue to advance as tolerated  MBS planned for Monday per granddaughter 9. Hyperlipidemia. Lipitor 10. Thrombocytosis  -platelets 886 on 4/2 -Plan is currently to follow-up outpatient for ongoing care(seen at [facility]).  -given GI sx, hydroxyurea on hold until tomorrow    -500mg  is lowest dose available at hospital   -gdaughter pushing fluids -pt received 1/2 unit blood prior to discharge to rehab 3/31  -recheck CBC Monday 11. Essential hypertension. Patient on Norvasc 2.5 mg daily prior to admission.    Resumed  3/31 with improvement  Controlled on 4/4   LOS: 5 days A FACE TO FACE EVALUATION WAS PERFORMED  Tyberius Ryner Lorie Phenix 02/03/2019, 9:47 AM

## 2019-02-04 NOTE — Progress Notes (Signed)
Patient's daughter requested half of the dose for hydrea claims it gives her stomach pain.

## 2019-02-04 NOTE — Plan of Care (Signed)
  Problem: RH KNOWLEDGE DEFICIT Goal: RH STG INCREASE KNOWLEDGE OF DYSPHAGIA/FLUID INTAKE Description Grand-daughter will be able to manage diet/fluid with handouts/education independently  Outcome: Progressing; daughter interpreting for the patient

## 2019-02-04 NOTE — Progress Notes (Signed)
Patient c/o sharp stomach pain per daughter it maybe due to the hydrea. Pain med given. Will continue to monitor.

## 2019-02-04 NOTE — Progress Notes (Signed)
[facility] PHYSICAL MEDICINE & REHABILITATION PROGRESS NOTE   Subjective/Complaints: Patient seen first ambulating in the hallways with her granddaughter, then sitting up at the edge of her bed, then ambulating again.  She states she slept well overnight.  She notes improvement in abdominal pain.  Granddaughter has questions regarding restarting hydroxyurea.  ROS: + LUQ tenderness, minimal today.  Denies CP, SOB, N/V/D  Objective:   No results found. No results for input(s): WBC, HGB, HCT, PLT in the last 72 hours. No results for input(s): NA, K, CL, CO2, GLUCOSE, BUN, CREATININE, CALCIUM in the last 72 hours.  Intake/Output Summary (Last 24 hours) at 02/04/2019 0852 Last data filed at 02/03/2019 1300 Gross per 24 hour  Intake 120 ml  Output -  Net 120 ml     Physical Exam: Vital Signs Blood pressure 138/81, pulse (!) 58, temperature 98.3 F (36.8 C), temperature source Oral, resp. rate 14, height 4\' 8"  (1.422 m), weight 45.1 kg, SpO2 92 %.  Constitutional: NAD.  Vital signs reviewed. HENT: Normocephalic.  Atraumatic. Eyes: EOMI. No discharge Cardiovascular: No JVD    Respiratory: Normal effort    GI: tenderness to palpation in LUQ, minimal today Neurological: Alert Motor: RLE 4+/5 proximal distal LLE 4+/5 prox to distal. Skin: Skin iswarmand dry.  Psychiatric: smiling, pleasant  Assessment/Plan: 1. Functional deficits secondary to right PLIC, caudate, parietal infarcts which require 3+ hours per day of interdisciplinary therapy in a comprehensive inpatient rehab setting.  Physiatrist is providing close team supervision and 24 hour management of active medical problems listed below.  Physiatrist and rehab team continue to assess barriers to discharge/monitor patient progress toward functional and medical goals  Care Tool:  Bathing    Body parts bathed by patient: Right arm, Left arm, Chest, Abdomen, Front perineal area, Right upper leg, Left upper leg, Face, Right  lower leg, Left lower leg, Buttocks   Body parts bathed by helper: Buttocks, Right lower leg, Left lower leg     Bathing assist Assist Level: Minimal Assistance - Patient > 75%     Upper Body Dressing/Undressing Upper body dressing Upper body dressing/undressing activity did not occur (including orthotics): Safety/medical concerns What is the patient wearing?: Button up shirt    Upper body assist Assist Level: Minimal Assistance - Patient > 75%    Lower Body Dressing/Undressing Lower body dressing      What is the patient wearing?: Incontinence brief, Pants     Lower body assist Assist for lower body dressing: Minimal Assistance - Patient > 75%     Toileting Toileting    Toileting assist Assist for toileting: Minimal Assistance - Patient > 75%     Transfers Chair/bed transfer  Transfers assist     Chair/bed transfer assist level: Supervision/Verbal cueing     Locomotion Ambulation   Ambulation assist      Assist level: Supervision/Verbal cueing Assistive device: Rollator Max distance: 267ft   Walk 10 feet activity   Assist     Assist level: Supervision/Verbal cueing Assistive device: Rollator   Walk 50 feet activity   Assist    Assist level: Supervision/Verbal cueing Assistive device: Walker-platform    Walk 150 feet activity   Assist Walk 150 feet activity did not occur: Safety/medical concerns(decreased activity tolerance)  Assist level: Supervision/Verbal cueing Assistive device: Walker-platform    Walk 10 feet on uneven surface  activity   Assist     Assist level: Moderate Assistance - Patient - 50 - 74% Assistive device: Walker-rolling  Wheelchair     Assist Will patient use wheelchair at discharge?: Yes Type of Wheelchair: Manual    Wheelchair assist level: Contact Guard/Touching assist Max wheelchair distance: 75    Wheelchair 50 feet with 2 turns activity    Assist        Assist Level: Contact  Guard/Touching assist   Wheelchair 150 feet activity     Assist Wheelchair 150 feet activity did not occur: Safety/medical concerns(decreased activity tolerance)        Medical Problem List and Plan: 1.Left side weakness with dysarthria/dysphagiasecondary to acute infarctinthe right posterior limb of internal capsule, caudate body,and right parietal cortex. Plan 30 day event monitor on outpatient. Aspirin Plavix 3 weeks then Plavix alone  Continue CIR 2. Antithrombotics: -DVT/anticoagulation:Subcutaneous Lovenox -antiplatelet therapy: Aspirin 81 mg daily, Plavix 75 mg daily 3. Pain Management:Tylenol as needed 4. Mood:Provide emotional support, granddaughter supportive -antipsychotic agents: N/A 5. Neuropsych: This patientiscapable of making decisions on herown behalf. 6. Skin/Wound Care:Routine skin checks 7. Fluids/Electrolytes/Nutrition:    -intake reasonable, granddaughter helps   -BUN/creatinine within normal limits on 4/2  -Labs ordered for tomorrow 8. Dysphagia. Dysphagia #1 nectar liquids.  Advanced to D2 honey, continue to advance as tolerated  MBS planned for tomorrow per granddaughter 9. Hyperlipidemia. Lipitor 10. Thrombocytosis  -platelets 886 on 4/2 -Plan is currently to follow-up outpatient for ongoing care(seen at [facility]).  -given GI sx, hydroxyurea on hold     -500mg  is lowest dose available at hospital   -gdaughter pushing fluids -pt received 1/2 unit blood prior to discharge to rehab 3/31  -recheck CBC tomorrow and consider restarting hydroxyurea if necessary 11. Essential hypertension. Patient on Norvasc 2.5 mg daily prior to admission.    Resumed  3/31 with improvement  Controlled on 4/5   LOS: 6 days A FACE TO FACE EVALUATION WAS PERFORMED  Ankit Lorie Phenix 02/04/2019, 8:52 AM

## 2019-02-05 ENCOUNTER — Inpatient Hospital Stay (HOSPITAL_COMMUNITY): Payer: Self-pay

## 2019-02-05 ENCOUNTER — Inpatient Hospital Stay (HOSPITAL_COMMUNITY): Payer: Self-pay | Admitting: Occupational Therapy

## 2019-02-05 ENCOUNTER — Inpatient Hospital Stay (HOSPITAL_COMMUNITY): Payer: Self-pay | Admitting: Speech Pathology

## 2019-02-05 ENCOUNTER — Telehealth: Payer: Self-pay

## 2019-02-05 ENCOUNTER — Encounter (HOSPITAL_COMMUNITY): Payer: Self-pay | Admitting: Speech Pathology

## 2019-02-05 LAB — CBC
HCT: 42.9 % (ref 36.0–46.0)
Hemoglobin: 13.3 g/dL (ref 12.0–15.0)
MCH: 27.1 pg (ref 26.0–34.0)
MCHC: 31 g/dL (ref 30.0–36.0)
MCV: 87.6 fL (ref 80.0–100.0)
Platelets: 928 10*3/uL (ref 150–400)
RBC: 4.9 MIL/uL (ref 3.87–5.11)
RDW: 15.5 % (ref 11.5–15.5)
WBC: 10 10*3/uL (ref 4.0–10.5)
nRBC: 0 % (ref 0.0–0.2)

## 2019-02-05 LAB — BASIC METABOLIC PANEL
Anion gap: 7 (ref 5–15)
BUN: 20 mg/dL (ref 8–23)
CO2: 25 mmol/L (ref 22–32)
Calcium: 8.7 mg/dL — ABNORMAL LOW (ref 8.9–10.3)
Chloride: 106 mmol/L (ref 98–111)
Creatinine, Ser: 1.03 mg/dL — ABNORMAL HIGH (ref 0.44–1.00)
GFR calc Af Amer: 56 mL/min — ABNORMAL LOW (ref >60)
GFR calc non Af Amer: 48 mL/min — ABNORMAL LOW (ref >60)
Glucose, Bld: 95 mg/dL (ref 70–99)
Potassium: 4.1 mmol/L (ref 3.5–5.1)
Sodium: 138 mmol/L (ref 135–145)

## 2019-02-05 MED ORDER — ANAGRELIDE HCL 0.5 MG PO CAPS
0.5000 mg | ORAL_CAPSULE | Freq: Two times a day (BID) | ORAL | Status: DC
  Filled 2019-02-05 (×7): qty 1

## 2019-02-05 MED ORDER — SENNOSIDES-DOCUSATE SODIUM 8.6-50 MG PO TABS
1.0000 | ORAL_TABLET | Freq: Two times a day (BID) | ORAL | Status: DC
  Filled 2019-02-05 (×5): qty 1

## 2019-02-05 MED ORDER — WITCH HAZEL-GLYCERIN EX PADS
MEDICATED_PAD | CUTANEOUS | Status: DC | PRN
  Filled 2019-02-05: qty 100

## 2019-02-05 NOTE — Telephone Encounter (Signed)
Ana (patient's granddaughter) calls stating she does not think the patient will be able to tolerate the Hydrea.  First dose was given on Saturday and the patient experienced a lot of left lower abdominal pain, they repeated the dose on Sunday and the same thing happened, patient says her abdomen feels swollen and hard. She just wanted to let Dr. Burr Medico know.   [phone]  Messaged placed on Dr. Ernestina Penna desk for review.

## 2019-02-05 NOTE — Progress Notes (Signed)
Speech Language Pathology Daily Session Note  Patient Details  Name: Jill Castaneda MRN: [id number] Date of Birth: January 08, 1928  Today's Date: 02/05/2019 SLP Individual Time: 1115-1200 SLP Individual Time Calculation (min): 45 min  Short Term Goals: Week 1: SLP Short Term Goal 1 (Week 1): STG=LTG due to short ELOS  Skilled Therapeutic Interventions:  Skilled treatment session focused on completing education with pt's granddaughter on results of MBS, areas of deficits and recommendation for thin liquids. Also reviewed potential s/s of aspiration pneumonia and general aspiration precautions. All questions answered to her satisfaction.      Pain    Therapy/Group: Individual Therapy  Shariyah Eland 02/05/2019, 12:03 PM

## 2019-02-05 NOTE — Progress Notes (Signed)
Physical Therapy Session Note  Patient Details  Name: Jill Castaneda MRN: [id number] Date of Birth: 08/13/1928  Today's Date: 02/05/2019 PT Individual Time: 7510-2585 PT Individual Time Calculation (min): 57 min   Short Term Goals: Week 1:  PT Short Term Goal 1 (Week 1): Patient will perform sitting balance without UE support for >2 minutes while maintaining midline without cuing. PT Short Term Goal 2 (Week 1): Patient will perform transfers from various household surfaces and car transfers with CGA for safety and balance. PT Short Term Goal 3 (Week 1): Patient will ambulate >98' with CGA for safety using LRAD. PT Short Term Goal 4 (Week 1): Patient will go up and down 4 steps using 1 rail and/or LRAD with CGA for safety and balance.  Skilled Therapeutic Interventions/Progress Updates:    Pt seated EOB with daughter upon PT arrival, agreeable to therapy tx and denies pain. Pt transferred sit<>stand with CGA and ambulated to the gym x 200 ft with rollator. Session focused on dynamic standing balance and LE strengthening without UE support. Pt performed x 10 sit<>stands from mat without UE support and CGA. Pt participated in berg balance test as detailed below, scored 37/56 and discussed these results with pt and granddaughter. Pt worked on dynamic standing balance to perform gait without UE support and backwards walking without UE support. Pt worked on R lateral weightshifting and dynamic standing balance to performed toe taps with L LE on 2 inch step x 10 and on 4 inch step x 10 withtou UE support and min assist. Pt tried to ambulate while catching ball, pt with difficulty catching ball secondary to decreased coordination. Pt worked on R lateral weightshifting while standing on wedge to perform R lateral overhead reaching task, manual facilitation for increased weightshift to R side. Pt ambulated back to room with rollator and CGA, left in care of granddaughter.   Therapy Documentation Precautions:   Precautions Precautions: Fall Restrictions Weight Bearing Restrictions: No  Balance Standardized Balance Assessment Standardized Balance Assessment: Berg Balance Test Berg Balance Test Sit to Stand: Able to stand without using hands and stabilize independently Standing Unsupported: Able to stand 2 minutes with supervision Sitting with Back Unsupported but Feet Supported on Floor or Stool: Able to sit safely and securely 2 minutes Stand to Sit: Sits safely with minimal use of hands Transfers: Able to transfer safely, minor use of hands Standing Unsupported with Eyes Closed: Able to stand 10 seconds safely Standing Ubsupported with Feet Together: Able to place feet together independently and stand for 1 minute with supervision From Standing, Reach Forward with Outstretched Arm: Can reach forward >12 cm safely (5") From Standing Position, Pick up Object from Floor: Unable to try/needs assist to keep balance From Standing Position, Turn to Look Behind Over each Shoulder: Looks behind from both sides and weight shifts well Turn 360 Degrees: Able to turn 360 degrees safely but slowly Standing Unsupported, Alternately Place Feet on Step/Stool: Able to complete >2 steps/needs minimal assist Standing Unsupported, One Foot in Front: Loses balance while stepping or standing Standing on One Leg: Tries to lift leg/unable to hold 3 seconds but remains standing independently Total Score: 37  Therapy/Group: Individual Therapy  Netta Corrigan, PT, DPT 02/05/2019, 8:01 AM

## 2019-02-05 NOTE — Telephone Encounter (Signed)
Oral Oncology Patient Advocate Encounter  Dazey called and stated that the patient has been referred to the free drug program and we should have a determination in 24-48 hours.  This encounter will be updated until final determination.  [place] [facility] Phone [phone] Fax [phone] 02/05/2019   3:14 PM

## 2019-02-05 NOTE — Progress Notes (Signed)
Modified Barium Swallow Progress Note  Patient Details  Name: Jill Castaneda MRN: [id number] Date of Birth: July 01, 1928  Today's Date: 02/05/2019  Modified Barium Swallow completed.  Full report located under Chart Review in the Imaging Section.  Brief recommendations include the following:  Clinical Impression  Pt presents with significant oral phase deficits that result in decreased bolus cohesion and widespread residue/management with nectar thick and honey thick liquids. As such, pt exhibits decreased bolus containment and difficulty posteriorally propelling bolus that result in premature spillage and piecemeal swallowing. Pt also demonstrates delayed swallow initiation to pyriform sinuses. The result of oral phase deficits and delayed swallow initiation results in penetration and eventual aspiration of nectar thick and honey thick liquids (PAS 7). Compensatory strategy of oral hold resulted in increased aspiration.  Pt with significantly delayed cough that was ineffective. However, when consuming small sips of thin liquids via cup, pt with decreased difficulty transitioning bolus posteriorally and she produced fewer piecemeal swallows with thin liquids. No penetration or aspiration were observed with thin liquids via cup sips. Multiple trials were given with no observed penetration or aspiration. Pt intermittently coughed but no evidence of aspiration observed. Continue to recommend crusihgn medicine in puree d/t inability to organize bolus of whole pill with puree. Recommend dyphagia 2 with thin liquids via cup sips, medicine crushed with puree.    Swallow Evaluation Recommendations       SLP Diet Recommendations: Dysphagia 2 (Fine chop) solids;Thin liquid   Liquid Administration via: Cup   Medication Administration: Crushed with puree   Supervision: Full supervision/cueing for compensatory strategies   Compensations: Slow rate;Small sips/bites;Minimize environmental distractions   Postural Changes: Seated upright at 90 degrees   Oral Care Recommendations: Oral care BID        Joellyn Grandt 02/05/2019,11:59 AM

## 2019-02-05 NOTE — Progress Notes (Signed)
Lab called for Platelets count [phone], PA notified, no new order. We continue to monitor.

## 2019-02-05 NOTE — Progress Notes (Signed)
Jill Castaneda   DOB:06/26/1928   JJ#:[id number]   KZS#:[id number]  Hematology f/u   Subjective: Patient has moved to inpatient rehab, doing well with rehabilitation.  She is able to move around with a walker, also making progress on swallowing, she is on liquid diet now.  She has tried Hydrea twice, both caused abdominal pain, bloating.  Her symptom subsequently resolved.   Objective:  Vitals:   02/04/19 2039 02/05/19 0451  BP: 122/86 134/81  Pulse: 71 60  Resp: 20 16  Temp: 98.1 F (36.7 C) 98 F (36.7 C)  SpO2: 90% 91%    Body mass index is 22.29 kg/m.  Intake/Output Summary (Last 24 hours) at 02/05/2019 1747 Last data filed at 02/05/2019 0900 Gross per 24 hour  Intake 440 ml  Output -  Net 440 ml     Sclerae unicteric  Oropharynx clear  No peripheral adenopathy  Lungs clear -- no rales or rhonchi  Heart regular rate and rhythm  Abdomen soft, mild tenderness in the left lower quadrant of abdomen, bowel sounds low  MSK no focal spinal tenderness, no peripheral edema    CBG (last 3)  No results for input(s): GLUCAP in the last 72 hours.   Labs:  Urine Studies No results for input(s): UHGB, CRYS in the last 72 hours.  Invalid input(s): UACOL, UAPR, USPG, UPH, UTP, UGL, UKET, UBIL, UNIT, UROB, ULEU, UEPI, UWBC, URBC, UBAC, [place], [place], [place]  Basic Metabolic Panel: Recent Labs  Lab 01/30/19 0704 02/01/19 0357 02/05/19 0502  NA 143 141 138  K 4.0 3.8 4.1  CL 110 111 106  CO2 23 27 25   GLUCOSE 92 94 95  BUN 24* 23 20  CREATININE 1.01* 0.96 1.03*  CALCIUM 8.6* 8.5* 8.7*   GFR Estimated Creatinine Clearance: 23.3 mL/min (A) (by C-G formula based on SCr of 1.03 mg/dL (H)). Liver Function Tests: Recent Labs  Lab 01/30/19 0704  AST 22  ALT 11  ALKPHOS 38  BILITOT 1.0  PROT 5.3*  ALBUMIN 2.6*   No results for input(s): LIPASE, AMYLASE in the last 168 hours. No results for input(s): AMMONIA in the last 168 hours. Coagulation profile No results for input(s):  INR, PROTIME in the last 168 hours.  CBC: Recent Labs  Lab 01/30/19 0704 02/01/19 0357 02/05/19 0502  WBC 10.0 9.2 10.0  NEUTROABS 7.6  --   --   HGB 14.1 13.1 13.3  HCT 45.4 42.6 42.9  MCV 87.5 87.7 87.6  PLT 871* 886* 928*   Cardiac Enzymes: No results for input(s): CKTOTAL, CKMB, CKMBINDEX, TROPONINI in the last 168 hours. BNP: Invalid input(s): POCBNP CBG: No results for input(s): GLUCAP in the last 168 hours. D-Dimer No results for input(s): DDIMER in the last 72 hours. Hgb A1c No results for input(s): HGBA1C in the last 72 hours. Lipid Profile No results for input(s): CHOL, HDL, LDLCALC, TRIG, CHOLHDL, LDLDIRECT in the last 72 hours. Thyroid function studies No results for input(s): TSH, T4TOTAL, T3FREE, THYROIDAB in the last 72 hours.  Invalid input(s): FREET3 Anemia work up No results for input(s): VITAMINB12, FOLATE, FERRITIN, TIBC, IRON, RETICCTPCT in the last 72 hours. Microbiology No results found for this or any previous visit (from the past 240 hour(s)).    Studies:  Dg Swallowing Func-speech Pathology  Result Date: 02/05/2019 Objective Swallowing Evaluation: Type of Study: MBS-Modified Barium Swallow Study  Patient Details Name: Jill Castaneda MRN: [id number] Date of Birth: 02-27-1928 Today's Date: 02/05/2019 Time: SLP Start Time (ACUTE ONLY): 7062 -SLP  Stop Time (ACUTE ONLY): 1405 SLP Time Calculation (min) (ACUTE ONLY): 30 min Past Medical History: Past Medical History: Diagnosis Date . Cataract  . Complication of anesthesia   Difficulty waking up " . Coronary artery disease  . Hip fracture ([place]) 06/2017  right hip . Hypertension  . Osteoporosis  . Stroke [facility])  Past Surgical History: Past Surgical History: Procedure Laterality Date . ANTERIOR APPROACH HEMI HIP ARTHROPLASTY Right 06/20/2017  Procedure: ANTERIOR APPROACH HEMI HIP ARTHROPLASTY;  Surgeon: Rod Can, MD;  Location: [place];  Service: Orthopedics;  Laterality: Right; . FRACTURE SURGERY   . SHOULDER  HARDWARE REMOVAL Left  . SHOULDER HEMI-ARTHROPLASTY Left  . TOTAL HIP ARTHROPLASTY Right  HPI: Pt is an 85 year old female who presents with slurred speech and difficulty swallowing. MRI showed  a new posterior limb of the internal capsule stroke on the right as well as a small area of cortical stroke in the right frontal cortex. PMH includes: CAD, HTN, CVA (with expressive aphasia that improved almost back to baseline per granddaughter, no other SLP needs at that time)  Subjective: alert, soft speech, also slurred per family that is interpreting Assessment / Plan / Recommendation CHL IP CLINICAL IMPRESSIONS 02/05/2019 Clinical Impression Pt presents with significant oral phase deficits that result in decreased bolus cohesion and widespread residue/management with nectar thick and honey thick liquids. As such, pt exhibits decreased bolus containment and difficulty posteriorally propelling bolus that result in premature spillage and piecemeal swallowing. Pt also demonstrates delayed swallow initiation to pyriform sinuses. The result of oral phase deficits and delayed swallow initiation results in penetration and eventual aspiration of nectar thick and honey thick liquids (PAS 7). Compensatory strategy of oral hold resulted in increased aspiration.  Pt with significantly delayed cough that was ineffective. However, when consuming small sips of thin liquids via cup, pt with decreased difficulty transitioning bolus posteriorally and she produced fewer piecemeal swallows with thin liquids. No penetration or aspiration were observed with thin liquids via cup sips. Multiple trials were given with no observed penetration or aspiration. Pt intermittently coughed but no evidence of aspiration observed. Continue to recommend crusihgn medicine in puree d/t inability to organize bolus of whole pill with puree. Recommend dyphagia 2 with thin liquids via cup sips, medicine crushed with puree.  SLP Visit Diagnosis Dysphagia,  oropharyngeal phase (R13.12) Attention and concentration deficit following -- Frontal lobe and executive function deficit following -- Impact on safety and function Mild aspiration risk   CHL IP TREATMENT RECOMMENDATION 02/05/2019 Treatment Recommendations Therapy as outlined in treatment plan below   Prognosis 01/26/2019 Prognosis for Safe Diet Advancement Good Barriers to Reach Goals -- Barriers/Prognosis Comment -- CHL IP DIET RECOMMENDATION 02/05/2019 SLP Diet Recommendations Dysphagia 2 (Fine chop) solids;Thin liquid Liquid Administration via Cup Medication Administration Crushed with puree Compensations Slow rate;Small sips/bites;Minimize environmental distractions Postural Changes Seated upright at 90 degrees   CHL IP OTHER RECOMMENDATIONS 02/05/2019 Recommended Consults -- Oral Care Recommendations Oral care BID Other Recommendations --   CHL IP FOLLOW UP RECOMMENDATIONS 01/27/2019 Follow up Recommendations Home health SLP;24 hour supervision/assistance   CHL IP FREQUENCY AND DURATION 01/26/2019 Speech Therapy Frequency (ACUTE ONLY) min 2x/week Treatment Duration 2 weeks      CHL IP ORAL PHASE 02/05/2019 Oral Phase Impaired Oral - Pudding Teaspoon -- Oral - Pudding Cup -- Oral - Honey Teaspoon Impaired mastication;Weak lingual manipulation;Reduced posterior propulsion;Piecemeal swallowing;Delayed oral transit;Decreased bolus cohesion;Premature spillage Oral - Honey Cup Impaired mastication;Weak lingual manipulation;Reduced posterior propulsion;Piecemeal swallowing;Delayed oral transit;Decreased bolus cohesion;Premature  spillage Oral - Nectar Teaspoon Impaired mastication;Weak lingual manipulation;Reduced posterior propulsion;Pocketing in anterior sulcus;Piecemeal swallowing;Delayed oral transit;Premature spillage;Decreased bolus cohesion Oral - Nectar Cup Weak lingual manipulation;Lingual pumping;Reduced posterior propulsion;Piecemeal swallowing;Delayed oral transit;Decreased bolus cohesion;Premature spillage;Impaired  mastication Oral - Nectar Straw Impaired mastication;Weak lingual manipulation;Lingual pumping;Reduced posterior propulsion;Piecemeal swallowing;Delayed oral transit;Decreased bolus cohesion;Premature spillage Oral - Thin Teaspoon Weak lingual manipulation;Premature spillage;Decreased bolus cohesion;Reduced posterior propulsion Oral - Thin Cup Weak lingual manipulation;Reduced posterior propulsion;Premature spillage;Decreased bolus cohesion Oral - Thin Straw -- Oral - Puree Weak lingual manipulation;Premature spillage;Reduced posterior propulsion Oral - Mech Soft Weak lingual manipulation;Premature spillage;Reduced posterior propulsion Oral - Regular -- Oral - Multi-Consistency -- Oral - Pill Weak lingual manipulation;Decreased bolus cohesion;Premature spillage;Delayed oral transit;Piecemeal swallowing;Impaired mastication Oral Phase - Comment --  CHL IP PHARYNGEAL PHASE 02/05/2019 Pharyngeal Phase Impaired Pharyngeal- Pudding Teaspoon -- Pharyngeal -- Pharyngeal- Pudding Cup -- Pharyngeal -- Pharyngeal- Honey Teaspoon Delayed swallow initiation-pyriform sinuses;Penetration/Aspiration during swallow;Pharyngeal residue - valleculae Pharyngeal Material enters airway, passes BELOW cords and not ejected out despite cough attempt by patient Pharyngeal- Honey Cup Delayed swallow initiation-pyriform sinuses;Penetration/Aspiration during swallow Pharyngeal Material enters airway, passes BELOW cords and not ejected out despite cough attempt by patient Pharyngeal- Nectar Teaspoon Delayed swallow initiation-pyriform sinuses;Penetration/Aspiration during swallow Pharyngeal Material enters airway, passes BELOW cords and not ejected out despite cough attempt by patient Pharyngeal- Nectar Cup Delayed swallow initiation-pyriform sinuses;Penetration/Aspiration during swallow Pharyngeal Material enters airway, passes BELOW cords and not ejected out despite cough attempt by patient Pharyngeal- Nectar Straw Delayed swallow  initiation-pyriform sinuses;Penetration/Aspiration during swallow Pharyngeal Material enters airway, passes BELOW cords and not ejected out despite cough attempt by patient Pharyngeal- Thin Teaspoon Delayed swallow initiation-pyriform sinuses Pharyngeal Material does not enter airway Pharyngeal- Thin Cup Delayed swallow initiation-pyriform sinuses Pharyngeal -- Pharyngeal- Thin Straw -- Pharyngeal -- Pharyngeal- Puree Delayed swallow initiation-vallecula;Pharyngeal residue - valleculae Pharyngeal -- Pharyngeal- Mechanical Soft Delayed swallow initiation-vallecula;Pharyngeal residue - valleculae Pharyngeal -- Pharyngeal- Regular -- Pharyngeal -- Pharyngeal- Multi-consistency -- Pharyngeal -- Pharyngeal- Pill (No Data) Pharyngeal -- Pharyngeal Comment --  CHL IP CERVICAL ESOPHAGEAL PHASE 02/05/2019 Cervical Esophageal Phase WFL Pudding Teaspoon -- Pudding Cup -- Honey Teaspoon -- Honey Cup -- Nectar Teaspoon -- Nectar Cup -- Nectar Straw -- Thin Teaspoon -- Thin Cup -- Thin Straw -- Puree -- Mechanical Soft -- Regular -- Multi-consistency -- Pill -- Cervical Esophageal Comment -- Happi Overton 02/05/2019, 12:00 PM               Assessment: 85 y.o.  [place] female, with past medical history of hypertension, 2 episodes of stroke, coronary artery disease, history of PE after hip replacement, presented with acute CVA, blood counts showed worsening thrombocytosis.  1.  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  Recurrent CVA 3. HTN  4. History of PE after hip replacement  5. CAD    Plan: -she could not tolerate Hydrea, will stop. -I will let her try anagrelide to 0.5 mg twice daily.  This is mainly for her thrombocytosis, she may need phlebotomy as needed for polycythemia.  Potential benefit and side effects discussed with patient, especially cardiac side effects.  Given her very high platelet count, recurrent stroke, I think she needs treatment for her thrombocytosis  -I will start her on anagrelide 0.5  mg twice daily tomorrow, I spoke with inpatient pharmacy. -Unfortunately there is a shortage in supply in outpatient pharmacy, I will work with our oral pharmacy to see if we can get any assistance from manufacture -I also discussed the option of deferring  injection for her MPN, however she is likely going to have side effects, will hold on for now.  -If she is going to home later this week, I will see her next week for lab and f/u  -I also suggest her to use stool softener, her mild left lower quadrant abdominal pain may be related to mild constipation. -The above was discussed with her granddaughter [name], she explained to patient. -no need to repeat CBC this week    Truitt Merle, MD 02/05/2019  5:47 PM

## 2019-02-05 NOTE — Progress Notes (Signed)
Occupational Therapy Session Note  Patient Details  Name: Jill Castaneda MRN: [id number] Date of Birth: August 22, 1928  Today's Date: 02/05/2019 OT Individual Time: 6060-0459 OT Individual Time Calculation (min): 59 min  and Today's Date: 02/05/2019 OT Missed Time: 15 Minutes Missed Time Reason: Other (comment)(transport for modified swallow study)   Short Term Goals: Week 1:  OT Short Term Goal 1 (Week 1): STGs=LTGs secondary to estimated short LOS  Skilled Therapeutic Interventions/Progress Updates:    Upon entering the room, pt seated on EOB with granddaughter, who is interpreter, with no c/o pain and requesting to shower. Pt standing with supervision and ambulating with min guard and hand held assist into bathroom for toileting needs. Caregiver providing assistance this session for continued hands on education secondary to upcoming discharge. Pt doffing clothing from seated position on commode for safety and transferred onto shower seat. Pt bathing with min verbal cuing for caregiver for safety awareness and L lateral lean. Pt bathing all parts and given cuing for lateral leans to wash buttocks and peri are. Pt utilized L UE to wash self and bilateral coordination tasks such as washing hair without cuing needed. Pt sitting on commode chair to don clothing items with increased time pt is able to button shirt. Pt standing with close supervision for Lb clothing management and encouragement to perform tasks herself secondary to culturally having granddaughter assist. Pt taking seated rest break and brushing hair with set up A with B hands. Transport arriving to take pt to swallow study and pt transferred into chair and left room. 15 missed minutes.  Therapy Documentation Precautions:  Precautions Precautions: Fall Restrictions Weight Bearing Restrictions: No General: General OT Amount of Missed Time: 15 Minutes   Therapy/Group: Individual Therapy  Gypsy Decant 02/05/2019, 9:29 AM

## 2019-02-06 ENCOUNTER — Inpatient Hospital Stay (HOSPITAL_COMMUNITY): Payer: Self-pay

## 2019-02-06 ENCOUNTER — Inpatient Hospital Stay (HOSPITAL_COMMUNITY): Payer: Self-pay | Admitting: Physical Therapy

## 2019-02-06 ENCOUNTER — Inpatient Hospital Stay (HOSPITAL_COMMUNITY): Payer: Self-pay | Admitting: Occupational Therapy

## 2019-02-06 DIAGNOSIS — D471 Chronic myeloproliferative disease: Secondary | ICD-10-CM

## 2019-02-06 DIAGNOSIS — D62 Acute posthemorrhagic anemia: Secondary | ICD-10-CM

## 2019-02-06 LAB — CBC
HCT: 35.3 % — ABNORMAL LOW (ref 36.0–46.0)
Hemoglobin: 11.2 g/dL — ABNORMAL LOW (ref 12.0–15.0)
MCH: 27.7 pg (ref 26.0–34.0)
MCHC: 31.7 g/dL (ref 30.0–36.0)
MCV: 87.2 fL (ref 80.0–100.0)
Platelets: 874 10*3/uL — ABNORMAL HIGH (ref 150–400)
RBC: 4.05 MIL/uL (ref 3.87–5.11)
RDW: 15.2 % (ref 11.5–15.5)
WBC: 11.3 10*3/uL — ABNORMAL HIGH (ref 4.0–10.5)
nRBC: 0 % (ref 0.0–0.2)

## 2019-02-06 MED ORDER — BETHANECHOL CHLORIDE 10 MG PO TABS
5.0000 mg | ORAL_TABLET | Freq: Three times a day (TID) | ORAL | Status: DC
  Administered 2019-02-06 – 2019-02-07 (×4): 5 mg via ORAL
  Filled 2019-02-06 (×5): qty 1

## 2019-02-06 MED ORDER — IOHEXOL 300 MG/ML  SOLN
100.0000 mL | Freq: Once | INTRAMUSCULAR | Status: AC | PRN
  Administered 2019-02-06: 100 mL via INTRAVENOUS

## 2019-02-06 MED ORDER — SODIUM CHLORIDE 0.45 % IV SOLN
INTRAVENOUS | Status: DC
  Administered 2019-02-06 – 2019-02-07 (×2): via INTRAVENOUS

## 2019-02-06 MED ORDER — TAMSULOSIN HCL 0.4 MG PO CAPS
0.4000 mg | ORAL_CAPSULE | Freq: Every day | ORAL | Status: DC
  Administered 2019-02-06 – 2019-02-08 (×3): 0.4 mg via ORAL
  Filled 2019-02-06 (×3): qty 1

## 2019-02-06 NOTE — Progress Notes (Signed)
At around shift change, patient daughter call staffs into the room to shows Mass to patient LLQ. When assessed, it was warm hard to touch. On-call notified, He ordered KUB, the KUB result came, and on-call was also notified. He further ordered CT ABD with contrast. At around 1:30 am this RN took patient down for CT. On-call was also notified with the CT result. The on-call came and assessed patient in person then he oredr STAT CBC and d/c some med. We continue to monitor.

## 2019-02-06 NOTE — Progress Notes (Signed)
Called on call provider Dan, [place] about patient's peri area swollen and bruised. Unable to I/O catheter patient as ordered. Granddaughter requesting because of  amount of discomfort and bruising observed can patient have a foley catheter placed. Order given by on call provider Dan, PA to place foley catheter and educate patient and family. Will continue to monitor.

## 2019-02-06 NOTE — Progress Notes (Signed)
Occupational Therapy Session Note  Patient Details  Name: Jill Castaneda MRN: [id number] Date of Birth: August 22, 1928  Today's Date: 02/06/2019 OT Individual Time: 4239-5320 OT Individual Time Calculation (min): 9 min    Short Term Goals: Week 1:  OT Short Term Goal 1 (Week 1): STGs=LTGs secondary to estimated short LOS  Skilled Therapeutic Interventions/Progress Updates:    Upon entering the room, pt in bed attempting to rest but appearing very restless. Pt's granddaughter present in room and very upset and concerned over pt's recent medical change and with several questions regarding medication. OT notified RN of caregiver's concern. Caregiver, Vicente Males, verbalized her grandmother has not slept well and is in a very high amount of pain and declines OT intervention at this time. Pt remained in bed and all needs within reach.   Therapy Documentation Precautions:  Precautions Precautions: Fall Restrictions Weight Bearing Restrictions: No General: General OT Amount of Missed Time: 60 Minutes Pain: Pain Assessment Pain Score: 0-No pain   Therapy/Group: Individual Therapy  Gypsy Decant 02/06/2019, 1:43 PM

## 2019-02-06 NOTE — Progress Notes (Signed)
Called on call provider Dan, [place]; notified provider that per granddaughter  Notifies staff per family and it ok by on call provider patient and granddaughter would like to wait for I/O cath if needed < 530ml and patient asymptomatic. Provider in agreement and family. Will continue to monitor

## 2019-02-06 NOTE — Progress Notes (Addendum)
[facility] PHYSICAL MEDICINE & REHABILITATION PROGRESS NOTE   Subjective/Complaints: Pt resting with grand daughter at bedside Pt up a lot last noc with xray/CT.  Discussed at length with grand daughter   ROS:   Somnolent with language barrier, limited, per GRand daughter no pain at rest in abd  Objective:   Dg Abd 1 View  Result Date: 02/05/2019 CLINICAL DATA:  Bowel obstruction. EXAM: ABDOMEN - 1 VIEW COMPARISON:  No prior abdominal imaging. FINDINGS: No bowel dilatation to suggest obstruction. There is enteric contrast throughout the entire colon likely from modified barium swallow earlier this day. Trace residual barium within the terminal ileum, there is otherwise no small bowel retained oral contrast. No evidence of free air. Multiple pelvic phleboliths. Bilateral hip arthroplasties. IMPRESSION: No bowel obstruction. Enteric contrast throughout the colon from modified barium swallow earlier this day. Electronically Signed   By: Keith Rake M.D.   On: 02/05/2019 20:20   Ct Abdomen Pelvis W Contrast  Result Date: 02/06/2019 CLINICAL DATA:  Pulsatile abdominal mass EXAM: CT ABDOMEN AND PELVIS WITH CONTRAST TECHNIQUE: Multidetector CT imaging of the abdomen and pelvis was performed using the standard protocol following bolus administration of intravenous contrast. CONTRAST:  170mL OMNIPAQUE IOHEXOL 300 MG/ML  SOLN COMPARISON:  None. FINDINGS: LOWER CHEST: There is no basilar pleural or apical pericardial effusion. HEPATOBILIARY: The hepatic contours and density are normal. There is no intra- or extrahepatic biliary dilatation. The gallbladder is normal. PANCREAS: The pancreatic parenchymal contours are normal and there is no ductal dilatation. There is no peripancreatic fluid collection. SPLEEN: Normal. ADRENALS/URINARY TRACT: --Adrenal glands: Normal. --Right kidney/ureter: Numerous small cysts. --Left kidney/ureter: Numerous small cysts. --Urinary bladder: Normal for degree of distention  STOMACH/BOWEL: --Stomach/Duodenum: There is no hiatal hernia or other gastric abnormality. The duodenal course and caliber are normal. --Small bowel: No dilatation or inflammation. --Colon: No focal abnormality. --Appendix: Not visualized. No right lower quadrant inflammation or free fluid. VASCULAR/LYMPHATIC: There is aortic atherosclerosis without hemodynamically significant stenosis. The portal vein, splenic vein, superior mesenteric vein and IVC are patent. No abdominal or pelvic lymphadenopathy. REPRODUCTIVE: No free fluid in the pelvis. MUSCULOSKELETAL. Multiple lumbar compression deformities, which appear chronic. OTHER: There is a left lower quadrant anterior abdominal wall hematoma that measures approximately 15 x 6 x 11 cm with evidence of active extravasation (8:29, 3:53). IMPRESSION: 1. Left lower quadrant anterior abdominal wall hematoma measuring 15 x 6 x 11 cm with active extravasation. 2. Numerous small bilateral renal cysts which are nonspecific but may be seen in the setting of lithium therapy or chronic dialysis. 3. Multiple age-indeterminate compression deformities of the lumbar spine, favored to be chronic. 4.  Aortic atherosclerosis (ICD10-I70.0). Critical Value/emergent results were called by telephone at the time of interpretation on 02/06/2019 at 2:11 am to [facility] , who verbally acknowledged these results. Electronically Signed   By: Ulyses Jarred M.D.   On: 02/06/2019 02:14   Dg Swallowing Func-speech Pathology  Result Date: 02/05/2019 Objective Swallowing Evaluation: Type of Study: MBS-Modified Barium Swallow Study  Patient Details Name: Erleen Egner MRN: [id number] Date of Birth: 11-18-1927 Today's Date: 02/05/2019 Time: SLP Start Time (ACUTE ONLY): 1335 -SLP Stop Time (ACUTE ONLY): 1405 SLP Time Calculation (min) (ACUTE ONLY): 30 min Past Medical History: Past Medical History: Diagnosis Date . Cataract  . Complication of anesthesia   Difficulty waking up " . Coronary artery  disease  . Hip fracture ([place]) 06/2017  right hip . Hypertension  . Osteoporosis  . Stroke [facility])  Past Surgical History: Past Surgical History: Procedure Laterality Date . ANTERIOR APPROACH HEMI HIP ARTHROPLASTY Right 06/20/2017  Procedure: ANTERIOR APPROACH HEMI HIP ARTHROPLASTY;  Surgeon: Rod Can, MD;  Location: [facility];  Service: Orthopedics;  Laterality: Right; . FRACTURE SURGERY   . SHOULDER HARDWARE REMOVAL Left  . SHOULDER HEMI-ARTHROPLASTY Left  . TOTAL HIP ARTHROPLASTY Right  HPI: Pt is an 85 year old female who presents with slurred speech and difficulty swallowing. MRI showed  a new posterior limb of the internal capsule stroke on the right as well as a small area of cortical stroke in the right frontal cortex. PMH includes: CAD, HTN, CVA (with expressive aphasia that improved almost back to baseline per granddaughter, no other SLP needs at that time)  Subjective: alert, soft speech, also slurred per family that is interpreting Assessment / Plan / Recommendation CHL IP CLINICAL IMPRESSIONS 02/05/2019 Clinical Impression Pt presents with significant oral phase deficits that result in decreased bolus cohesion and widespread residue/management with nectar thick and honey thick liquids. As such, pt exhibits decreased bolus containment and difficulty posteriorally propelling bolus that result in premature spillage and piecemeal swallowing. Pt also demonstrates delayed swallow initiation to pyriform sinuses. The result of oral phase deficits and delayed swallow initiation results in penetration and eventual aspiration of nectar thick and honey thick liquids (PAS 7). Compensatory strategy of oral hold resulted in increased aspiration.  Pt with significantly delayed cough that was ineffective. However, when consuming small sips of thin liquids via cup, pt with decreased difficulty transitioning bolus posteriorally and she produced fewer piecemeal swallows with thin liquids. No penetration or aspiration were  observed with thin liquids via cup sips. Multiple trials were given with no observed penetration or aspiration. Pt intermittently coughed but no evidence of aspiration observed. Continue to recommend crusihgn medicine in puree d/t inability to organize bolus of whole pill with puree. Recommend dyphagia 2 with thin liquids via cup sips, medicine crushed with puree.  SLP Visit Diagnosis Dysphagia, oropharyngeal phase (R13.12) Attention and concentration deficit following -- Frontal lobe and executive function deficit following -- Impact on safety and function Mild aspiration risk   CHL IP TREATMENT RECOMMENDATION 02/05/2019 Treatment Recommendations Therapy as outlined in treatment plan below   Prognosis 01/26/2019 Prognosis for Safe Diet Advancement Good Barriers to Reach Goals -- Barriers/Prognosis Comment -- CHL IP DIET RECOMMENDATION 02/05/2019 SLP Diet Recommendations Dysphagia 2 (Fine chop) solids;Thin liquid Liquid Administration via Cup Medication Administration Crushed with puree Compensations Slow rate;Small sips/bites;Minimize environmental distractions Postural Changes Seated upright at 90 degrees   CHL IP OTHER RECOMMENDATIONS 02/05/2019 Recommended Consults -- Oral Care Recommendations Oral care BID Other Recommendations --   CHL IP FOLLOW UP RECOMMENDATIONS 01/27/2019 Follow up Recommendations Home health SLP;24 hour supervision/assistance   CHL IP FREQUENCY AND DURATION 01/26/2019 Speech Therapy Frequency (ACUTE ONLY) min 2x/week Treatment Duration 2 weeks      CHL IP ORAL PHASE 02/05/2019 Oral Phase Impaired Oral - Pudding Teaspoon -- Oral - Pudding Cup -- Oral - Honey Teaspoon Impaired mastication;Weak lingual manipulation;Reduced posterior propulsion;Piecemeal swallowing;Delayed oral transit;Decreased bolus cohesion;Premature spillage Oral - Honey Cup Impaired mastication;Weak lingual manipulation;Reduced posterior propulsion;Piecemeal swallowing;Delayed oral transit;Decreased bolus cohesion;Premature spillage  Oral - Nectar Teaspoon Impaired mastication;Weak lingual manipulation;Reduced posterior propulsion;Pocketing in anterior sulcus;Piecemeal swallowing;Delayed oral transit;Premature spillage;Decreased bolus cohesion Oral - Nectar Cup Weak lingual manipulation;Lingual pumping;Reduced posterior propulsion;Piecemeal swallowing;Delayed oral transit;Decreased bolus cohesion;Premature spillage;Impaired mastication Oral - Nectar Straw Impaired mastication;Weak lingual manipulation;Lingual pumping;Reduced posterior propulsion;Piecemeal swallowing;Delayed oral transit;Decreased bolus cohesion;Premature spillage Oral - Thin  Teaspoon Weak lingual manipulation;Premature spillage;Decreased bolus cohesion;Reduced posterior propulsion Oral - Thin Cup Weak lingual manipulation;Reduced posterior propulsion;Premature spillage;Decreased bolus cohesion Oral - Thin Straw -- Oral - Puree Weak lingual manipulation;Premature spillage;Reduced posterior propulsion Oral - Mech Soft Weak lingual manipulation;Premature spillage;Reduced posterior propulsion Oral - Regular -- Oral - Multi-Consistency -- Oral - Pill Weak lingual manipulation;Decreased bolus cohesion;Premature spillage;Delayed oral transit;Piecemeal swallowing;Impaired mastication Oral Phase - Comment --  CHL IP PHARYNGEAL PHASE 02/05/2019 Pharyngeal Phase Impaired Pharyngeal- Pudding Teaspoon -- Pharyngeal -- Pharyngeal- Pudding Cup -- Pharyngeal -- Pharyngeal- Honey Teaspoon Delayed swallow initiation-pyriform sinuses;Penetration/Aspiration during swallow;Pharyngeal residue - valleculae Pharyngeal Material enters airway, passes BELOW cords and not ejected out despite cough attempt by patient Pharyngeal- Honey Cup Delayed swallow initiation-pyriform sinuses;Penetration/Aspiration during swallow Pharyngeal Material enters airway, passes BELOW cords and not ejected out despite cough attempt by patient Pharyngeal- Nectar Teaspoon Delayed swallow initiation-pyriform  sinuses;Penetration/Aspiration during swallow Pharyngeal Material enters airway, passes BELOW cords and not ejected out despite cough attempt by patient Pharyngeal- Nectar Cup Delayed swallow initiation-pyriform sinuses;Penetration/Aspiration during swallow Pharyngeal Material enters airway, passes BELOW cords and not ejected out despite cough attempt by patient Pharyngeal- Nectar Straw Delayed swallow initiation-pyriform sinuses;Penetration/Aspiration during swallow Pharyngeal Material enters airway, passes BELOW cords and not ejected out despite cough attempt by patient Pharyngeal- Thin Teaspoon Delayed swallow initiation-pyriform sinuses Pharyngeal Material does not enter airway Pharyngeal- Thin Cup Delayed swallow initiation-pyriform sinuses Pharyngeal -- Pharyngeal- Thin Straw -- Pharyngeal -- Pharyngeal- Puree Delayed swallow initiation-vallecula;Pharyngeal residue - valleculae Pharyngeal -- Pharyngeal- Mechanical Soft Delayed swallow initiation-vallecula;Pharyngeal residue - valleculae Pharyngeal -- Pharyngeal- Regular -- Pharyngeal -- Pharyngeal- Multi-consistency -- Pharyngeal -- Pharyngeal- Pill (No Data) Pharyngeal -- Pharyngeal Comment --  CHL IP CERVICAL ESOPHAGEAL PHASE 02/05/2019 Cervical Esophageal Phase WFL Pudding Teaspoon -- Pudding Cup -- Honey Teaspoon -- Honey Cup -- Nectar Teaspoon -- Nectar Cup -- Nectar Straw -- Thin Teaspoon -- Thin Cup -- Thin Straw -- Puree -- Mechanical Soft -- Regular -- Multi-consistency -- Pill -- Cervical Esophageal Comment -- Happi Overton 02/05/2019, 12:00 PM              Recent Labs    02/05/19 0502 02/06/19 0229  WBC 10.0 11.3*  HGB 13.3 11.2*  HCT 42.9 35.3*  PLT 928* 874*   Recent Labs    02/05/19 0502  NA 138  K 4.1  CL 106  CO2 25  GLUCOSE 95  BUN 20  CREATININE 1.03*  CALCIUM 8.7*    Intake/Output Summary (Last 24 hours) at 02/06/2019 0708 Last data filed at 02/06/2019 0111 Gross per 24 hour  Intake 240 ml  Output 2200 ml  Net -1960 ml      Physical Exam: Vital Signs Blood pressure 136/76, pulse 68, temperature 98.2 F (36.8 C), temperature source Axillary, resp. rate 17, height 4\' 8"  (1.422 m), weight 45.1 kg, SpO2 93 %.  Constitutional: NAD.  Vital signs reviewed. HENT: Normocephalic.  Atraumatic. Eyes: EOMI. No discharge Cardiovascular: No JVD    Respiratory: Normal effort    GI: moderate tenderness, firm mass in LUQ extending from lower costal margin to Left ASIS, to suprapubic area to midline on RIght  Neurological: Alert Motor: RLE 4+/5 proximal distal LLE 4+/5 prox to distal. Skin: Skin iswarmand dry.  Psychiatric: smiling, pleasant  Assessment/Plan: 1. Functional deficits secondary to right PLIC, caudate, parietal infarcts which require 3+ hours per day of interdisciplinary therapy in a comprehensive inpatient rehab setting.  Physiatrist is providing close team supervision and 24 hour management of active  medical problems listed below.  Physiatrist and rehab team continue to assess barriers to discharge/monitor patient progress toward functional and medical goals  Care Tool:  Bathing    Body parts bathed by patient: Right arm, Left arm, Chest, Abdomen, Front perineal area, Right upper leg, Left upper leg, Face, Right lower leg, Left lower leg, Buttocks   Body parts bathed by helper: Buttocks, Right lower leg, Left lower leg     Bathing assist Assist Level: Contact Guard/Touching assist     Upper Body Dressing/Undressing Upper body dressing Upper body dressing/undressing activity did not occur (including orthotics): Safety/medical concerns What is the patient wearing?: Button up shirt    Upper body assist Assist Level: Minimal Assistance - Patient > 75%    Lower Body Dressing/Undressing Lower body dressing      What is the patient wearing?: Incontinence brief, Pants     Lower body assist Assist for lower body dressing: Minimal Assistance - Patient > 75%     Toileting Toileting     Toileting assist Assist for toileting: Minimal Assistance - Patient > 75%     Transfers Chair/bed transfer  Transfers assist     Chair/bed transfer assist level: Contact Guard/Touching assist     Locomotion Ambulation   Ambulation assist      Assist level: Contact Guard/Touching assist Assistive device: Rollator Max distance: 283ft   Walk 10 feet activity   Assist     Assist level: Contact Guard/Touching assist Assistive device: Rollator   Walk 50 feet activity   Assist    Assist level: Contact Guard/Touching assist Assistive device: Rollator    Walk 150 feet activity   Assist Walk 150 feet activity did not occur: Safety/medical concerns(decreased activity tolerance)  Assist level: Contact Guard/Touching assist Assistive device: Rollator    Walk 10 feet on uneven surface  activity   Assist     Assist level: Moderate Assistance - Patient - 50 - 74% Assistive device: Aeronautical engineer Will patient use wheelchair at discharge?: Yes Type of Wheelchair: Manual    Wheelchair assist level: Contact Guard/Touching assist Max wheelchair distance: 75    Wheelchair 50 feet with 2 turns activity    Assist        Assist Level: Contact Guard/Touching assist   Wheelchair 150 feet activity     Assist Wheelchair 150 feet activity did not occur: Safety/medical concerns(decreased activity tolerance)        Medical Problem List and Plan: 1.Left side weakness with dysarthria/dysphagiasecondary to acute infarctinthe right posterior limb of internal capsule, caudate body,and right parietal cortex. Plan 30 day event monitor on outpatient. Aspirin Plavix 3 weeks then Plavix alone  Continue CIR PT, OT  2. Antithrombotics: On hold due to large abd wall hematoma -DVT/anticoagulation:Subcutaneous Lovenox- d/c -antiplatelet therapy: Aspirin 81 mg daily, Plavix 75 mg daily, d/c x 1 wk then resume ASA  only x 1 wk and then add plavix if no drop in Hgb 3. Pain Management:Tylenol as needed 4. Mood:Provide emotional support, granddaughter supportive -antipsychotic agents: N/A 5. Neuropsych: This patientiscapable of making decisions on herown behalf. 6. Skin/Wound Care:Routine skin checks 7. Fluids/Electrolytes/Nutrition:    -intake reasonable, granddaughter helps   -BUN/creatinine within normal limits on 4/2   8. Dysphagia. Dysphagia #1 nectar liquids.  Advanced to D2 honey, continue to advance as tolerated  MBS planned for tomorrow per granddaughter 9. Hyperlipidemia. Lipitor 10. Thrombocytosis: Myeloproliferative neoplasm- appreciate Heme/Onc note  -platelets 886 on 4/2, 874 on 4/7,  Anagrelide ordered per Heme but not in [place] is currently to follow-up outpatient for ongoing care(seen at [facility]).  -abd pain likely from hematoma hydroxyurea not likely cause of abd pain   -   -gdaughter pushing fluids -pt received 1/2 unit blood prior to discharge to rehab 3/31  -11. Essential hypertension. Patient on Norvasc 2.5 mg daily prior to admission.    Resumed  3/31 with improvement  Controlled on 4/7  12.  ABLA  LOS: 8 days A FACE TO FACE EVALUATION WAS PERFORMED  Charlett Blake 02/06/2019, 7:08 AM

## 2019-02-06 NOTE — Progress Notes (Signed)
Physical Therapy Session Note  Patient Details  Name: Jill Castaneda MRN: [id number] Date of Birth: Aug 16, 1928  Today's Date: 02/06/2019     Short Term Goals: Week 1:  PT Short Term Goal 1 (Week 1): Patient will perform sitting balance without UE support for >2 minutes while maintaining midline without cuing. PT Short Term Goal 2 (Week 1): Patient will perform transfers from various household surfaces and car transfers with CGA for safety and balance. PT Short Term Goal 3 (Week 1): Patient will ambulate >71' with CGA for safety using LRAD. PT Short Term Goal 4 (Week 1): Patient will go up and down 4 steps using 1 rail and/or LRAD with CGA for safety and balance.  Skilled Therapeutic Interventions/Progress Updates:  Tx1: Pt missed 75 minutes skilled therapy due to fatigue as per granddaughter pt was up most of night due to pain then at imaging. Will continue efforts when able   Tx2: Per granddaughter pt awoke briefly prior to PTA entering, pt continues to have pain and fatigue and deferring PT this session.      Therapy Documentation Precautions:  Precautions Precautions: Fall Restrictions Weight Bearing Restrictions: No   Therapy/Group: Individual Therapy  Delsy Etzkorn  Annelisa Ryback, PTA  02/06/2019, 7:54 AM

## 2019-02-06 NOTE — Progress Notes (Signed)
Speech Language Pathology Daily Session Note  Patient Details  Name: Jill Castaneda MRN: [id number] Date of Birth: Oct 17, 1928  Today's Date: 02/06/2019 SLP Individual Time: 9449-6759 SLP Individual Time Calculation (min): 42 min  Short Term Goals: Week 1: SLP Short Term Goal 1 (Week 1): STG=LTG due to short ELOS  Skilled Therapeutic Interventions: Skilled ST services focused on education and swallow skills. Pt has had a change in medical status, internal bleeding and inability to void, limited participation in therapy services. SLP retrieved lunch tray from kitchen for early tray. SLP facilitated PO consumption, Ana feed pt, pt consumed thin via cup x2 sips and dys 2 x3 spoonfuls, swallow function appeared appropriate and no overt s/s aspiration. Pt appeared to be tolerancing diet upgrade, however limited PO consumption due to medical issues. SLP provided education to [facility] on dysphagia 2 diet with handout. All questions were answered to satisfaction. SLP provided emotional support to [facility] pertaining to medical changes. Pt was left in room with call bell within reach and bed alarm set. ST recommends to continue skilled ST services.      Pain Pain Assessment Pain Score: 0-No pain  Therapy/Group: Individual Therapy  Shajuan Musso  [facility] 02/06/2019, 12:37 PM

## 2019-02-07 ENCOUNTER — Inpatient Hospital Stay (HOSPITAL_COMMUNITY): Payer: Self-pay | Admitting: Physical Therapy

## 2019-02-07 ENCOUNTER — Inpatient Hospital Stay (HOSPITAL_COMMUNITY): Payer: Self-pay | Admitting: Occupational Therapy

## 2019-02-07 ENCOUNTER — Inpatient Hospital Stay (HOSPITAL_COMMUNITY): Payer: Self-pay

## 2019-02-07 LAB — CBC WITH DIFFERENTIAL/PLATELET
Abs Immature Granulocytes: 0.07 10*3/uL (ref 0.00–0.07)
Basophils Absolute: 0.2 10*3/uL — ABNORMAL HIGH (ref 0.0–0.1)
Basophils Relative: 2 %
Eosinophils Absolute: 0.2 10*3/uL (ref 0.0–0.5)
Eosinophils Relative: 2 %
HCT: 30.9 % — ABNORMAL LOW (ref 36.0–46.0)
Hemoglobin: 9.6 g/dL — ABNORMAL LOW (ref 12.0–15.0)
Immature Granulocytes: 1 %
Lymphocytes Relative: 5 %
Lymphs Abs: 0.5 10*3/uL — ABNORMAL LOW (ref 0.7–4.0)
MCH: 27.2 pg (ref 26.0–34.0)
MCHC: 31.1 g/dL (ref 30.0–36.0)
MCV: 87.5 fL (ref 80.0–100.0)
Monocytes Absolute: 0.9 10*3/uL (ref 0.1–1.0)
Monocytes Relative: 9 %
Neutro Abs: 8.3 10*3/uL — ABNORMAL HIGH (ref 1.7–7.7)
Neutrophils Relative %: 81 %
Platelets: 806 10*3/uL — ABNORMAL HIGH (ref 150–400)
RBC: 3.53 MIL/uL — ABNORMAL LOW (ref 3.87–5.11)
RDW: 15.2 % (ref 11.5–15.5)
WBC: 10.2 10*3/uL (ref 4.0–10.5)
nRBC: 0 % (ref 0.0–0.2)

## 2019-02-07 MED ORDER — LIDOCAINE HCL URETHRAL/MUCOSAL 2 % EX GEL
1.0000 "application " | Freq: Once | CUTANEOUS | Status: AC
  Administered 2019-02-07: 1 via TOPICAL
  Filled 2019-02-07 (×2): qty 5

## 2019-02-07 MED ORDER — LORAZEPAM 0.5 MG PO TABS
0.2500 mg | ORAL_TABLET | Freq: Once | ORAL | Status: AC
  Administered 2019-02-07: 0.25 mg via ORAL
  Filled 2019-02-07: qty 1

## 2019-02-07 MED ORDER — LIDOCAINE HCL URETHRAL/MUCOSAL 2 % EX GEL
1.0000 "application " | Freq: Once | CUTANEOUS | Status: AC
  Administered 2019-02-07: 1 via URETHRAL
  Filled 2019-02-07: qty 5

## 2019-02-07 MED ORDER — LORAZEPAM 2 MG/ML IJ SOLN: 0.2500 mg | Freq: Once | INTRAMUSCULAR | Status: AC

## 2019-02-07 NOTE — Progress Notes (Signed)
Physical Therapy Session Note  Patient Details  Name: Jill Castaneda MRN: [id number] Date of Birth: 09/27/1928  Today's Date: 02/07/2019 PT Individual Time: 0905-1000 PT Individual Time Calculation (min): 55 min   Short Term Goals: Week 1:  PT Short Term Goal 1 (Week 1): Patient will perform sitting balance without UE support for >2 minutes while maintaining midline without cuing. PT Short Term Goal 2 (Week 1): Patient will perform transfers from various household surfaces and car transfers with CGA for safety and balance. PT Short Term Goal 3 (Week 1): Patient will ambulate >66' with CGA for safety using LRAD. PT Short Term Goal 4 (Week 1): Patient will go up and down 4 steps using 1 rail and/or LRAD with CGA for safety and balance.  Skilled Therapeutic Interventions/Progress Updates: Pt presented in bed agreeable to therapy. Pt c/o of pain at abdominal site unable to rate. Pt performed supine to sit minA due to pain. Pt able to perform STS transfers with CGA throughout session. Participated in seated and standing therex as follows throughout session with frequent rests due to pain and fatigue throughout session. Pt also performed several bouts of ambulation in room only per pt's preference with rollator with CGA/close S. Pt performed sit to supine with minA due to pain. During session MD arrived for assessment and discussion with granddaughter. Pt returned to bed at end of session and left with needs met.   Seated therex all performed to fatigue 15-20 reps LAQ Manually resisted hamstring pulls Standing Hip abd Heel rises Marches  PTA also performed hamstring stretch on block 1 min x 2 bilaterally      Therapy Documentation Precautions:  Precautions Precautions: Fall Restrictions Weight Bearing Restrictions: No General:   Vital Signs:   Pain: Pain Assessment Pain Scale: 0-10 Pain Score: 3  Faces Pain Scale: No hurt Pain Type: Other (Comment) Pain Location:  (pre-catherization )    Therapy/Group: Individual Therapy  Leonette Tischer  Tanysha Quant, PTA  02/07/2019, 1:47 PM

## 2019-02-07 NOTE — Progress Notes (Addendum)
Speech Language Pathology Weekly Progress and Session Note  Patient Details  Name: Jill Castaneda MRN: [id number] Date of Birth: 1927/11/17  Beginning of progress report period: January 29, 2019 End of progress report period: February 07, 2019  Today's Date: 02/07/2019 SLP Individual Time: 5916-3846 SLP Individual Time Calculation (min): 42 min  Short Term Goals: Week 1: SLP Short Term Goal 1 (Week 1): STG=LTG due to short ELOS SLP Short Term Goal 1 - Progress (Week 1): Partly met    New Short Term Goals: Week 2: SLP Short Term Goal 1 (Week 2): Pt will consume current diet of dsy 2 and thin via cup with supervision A verbal cues for use of swallow strategies. SLP Short Term Goal 2 (Week 2): Pt will consume dys 3 texture trials to assess solid advancement with supervision A verbal cues for use of swallow strategies.   Weekly Progress Updates: Pt made great progress meeting speech intelligibly LTG at word/phrase level supervision A and progressing towards swallow LTG. Pt participated in [facility] on 02/05/19, results indicated deficits in oral phase and delayed swallow initiation impacting swallow function with ineffective clearance of sensed aspiration/peniteration on HTL and NTL liquids. Pt demonstrated tolerance of thin via cup, with cough not correlated to aspiration, during several trials, recommending dys 2 and thin via cup small sips. Pt demonstrate tolerance of current diet and requiring min-supervision A verbal cues for swallow strategies, likely due to acute medical changes. SLP recommends to continue to assess tolerance of current diet with trials of dys 3 piror to discharge. Pt's caregiver, [name], confirmed dys 3 as baseline diet without dentures, in which she will not use at discharge due to improper fitting. Education has been completed Ana, on dys 2 textures, handout provided and small sips of thin. Pt would continue to benefit from skilled ST services in order to maximize functional independence and  reduce burden of care, with 24 hour supervision and continued ST services only for solid advancement if needed.      Intensity: Minumum of 1-2 x/day, 30 to 90 minutes Frequency: 3 to 5 out of 7 days Duration/Length of Stay: 4/11 Treatment/Interventions: Cueing hierarchy;Dysphagia/aspiration precaution training;Functional tasks;Patient/family education   Daily Session  Skilled Therapeutic Interventions:  Skilled ST services focused on swallow skills. Pt's granddaughter [name], stated pt requested NTL liquids due to the pain of coughing that thin liquid sometimes caused.  Ana has been thickening, tea escpeiall as needed. SLP facilitated PO consumption of both thin and NTL via cup, x2 immediate cough noted on both, with mild anterior spillage with thin liquids. Per translation, pt stated "this is ok (thins)" and consumed 3oz of thin and 4oz of NTL liquids. Pt demonstrated appropriate oral clearance and slight increase to manipulate bolus (mastication and bolus formation) however functional, on dys 1 and dys 2 textures with breakfast tray. In conversation with [facility], dys 3 is likely baseline texture without dentures, in which pt does not plan to wear again due to improper fit. SLP recommends continuing dys 2 and thin liquid diet, with trials of dys 3 as pt's health continues to improve during hospital stay. Pt was left with granddaughter in room and bed alarm set. SLP recommends to continue skilled services.        General    Pain Pain Assessment Pain Score: 0-No pain  Therapy/Group: Individual Therapy  Tiffannie Sloss  [facility] 02/07/2019, 11:11 AM

## 2019-02-07 NOTE — Telephone Encounter (Signed)
Oral Oncology Patient Advocate Encounter  [employer] has approved Droxia to be filled with them free of charge 02/06/19-02/06/20. They do require 60 day prescriptions at a time and it must be shipped to the cancer center for the patient to pick up here per [employer] policy.  I called Ana and gave her this information. She informed me that she is refusing this medicine and anagrilide for her grandmother at this time because of the bleeding problem she is having.  I will keep this active for now until it has been decided that the patient will not take this medicine per Dr. Burr Medico.   Ana verbalized understanding and great appreciation.  [place] [facility] Phone [phone] Fax [phone] 02/07/2019   4:20 PM

## 2019-02-07 NOTE — Progress Notes (Signed)
Notified on call provider that staff and writer was unable to successfully  I/o cath or place indwelling foley catheter as ordered; Bladder scan as ordered shift; Patient alert and asymptomatic; granddaughter at bedside. Order for urologist consult per on call provider. Will continue to monitor patient

## 2019-02-07 NOTE — Progress Notes (Signed)
Occupational Therapy Session Note  Patient Details  Name: Jill Castaneda MRN: [id number] Date of Birth: 1928-02-25  Today's Date: 02/07/2019 OT Individual Time: 1330-1430 OT Individual Time Calculation (min): 60 min    Short Term Goals: Week 1:  OT Short Term Goal 1 (Week 1): STGs=LTGs secondary to estimated short LOS  Skilled Therapeutic Interventions/Progress Updates:    Upon entering the room, pt supine in bed and agreeable to working with therapist this session. Pt's granddaughter present for continued education and to interpret. Pt given written HEP for B UE strengthening with use of level 1 resistive theraband. Pt performed 2 sets of 10 chest pulls, bicep curls, shoulder elevation, shoulder diagonals, and alternating punches with rest breaks as needed secondary to fatigue. Pt requesting to ambulating and about to ambulate 300' with rollator and min guard for safety. Pt with increased difficulty in tight turns. Pt returning to room and needing to have BM. Caregiver assisted pt to bathroom with min guard and pt having successful BM.   Therapy Documentation Precautions:  Precautions Precautions: Fall Restrictions Weight Bearing Restrictions: No General:   Vital Signs: Therapy Vitals Temp: 97.7 F (36.5 C) Pulse Rate: 71 Resp: 19 BP: 101/67 Patient Position (if appropriate): Lying Oxygen Therapy SpO2: 92 % O2 Device: Room Air   Therapy/Group: Individual Therapy  Gypsy Decant 02/07/2019, 4:41 PM

## 2019-02-07 NOTE — Progress Notes (Signed)
Physical Therapy Session Note  Patient Details  Name: Jill Castaneda MRN: [id number] Date of Birth: July 30, 1928  Today's Date: 02/07/2019 PT Individual Time: 1200-1230 PT Individual Time Calculation (min): 30 min   Short Term Goals: Week 1:  PT Short Term Goal 1 (Week 1): Patient will perform sitting balance without UE support for >2 minutes while maintaining midline without cuing. PT Short Term Goal 2 (Week 1): Patient will perform transfers from various household surfaces and car transfers with CGA for safety and balance. PT Short Term Goal 3 (Week 1): Patient will ambulate >58' with CGA for safety using LRAD. PT Short Term Goal 4 (Week 1): Patient will go up and down 4 steps using 1 rail and/or LRAD with CGA for safety and balance.  Skilled Therapeutic Interventions/Progress Updates:   Pt received supine in bed and agreeable to PT. Supine>sit transfer with min assist and from granddaughter due to pain in abdomin.    Gait training with Rollator. X 160f+ 165f+ [id number] for clothing managmeent and min cues for step length on the L as well as safety in turns. Pt reports feeling like there is a sack sitting on top of her stomach, but no increased pain. Sit<>stand from mat table in day room 2 x 5 with BUE support on RW.   Pt returned to room and performed ambulatory transfer to bed with CGA. Sit>supine completed with supervision assist and increased time. Pt left supine in bed with call bell in reach and all needs met.        Therapy Documentation Precautions:  Precautions Precautions: Fall Restrictions Weight Bearing Restrictions: No    Pain: Pain Assessment Pain Scale: 0-10 Pain Score: 3  Faces Pain Scale: No hurt Pain Type: Other (Comment) Pain Location: (pre-catherization )    Therapy/Group: Individual Therapy  AusLorie Phenix8/2020, 1:30 PM

## 2019-02-07 NOTE — Progress Notes (Signed)
Occupational Therapy Note  Patient Details  Name: Shemika Robbs MRN: [id number] Date of Birth: 01/01/1928  Today's Date: 02/07/2019 OT Missed Time: 54 Minutes Missed Time Reason: Nursing care  Pt currently having foley catheter placed. 30 minutes missed secondary to nursing care. Will follow up at next schedule session.   Darleen Crocker P 02/07/2019, 11:13 AM

## 2019-02-07 NOTE — Patient Care Conference (Signed)
Inpatient RehabilitationTeam Conference and Plan of Care Update Date: 02/07/2019   Time: 10:30 AM    Patient Name: Jill Castaneda      Medical Record Number: [id number]  Date of Birth: [birth year] Sex: Female         Room/Bed: 4W20C/4W20C-01 Payor Info: Payor: MEDICAID POTENTIAL / Plan: MEDICAID POTENTIAL / Product Type: *No Product type* /    Admitting Diagnosis: CVA  Admit Date/Time:  01/29/2019  5:14 PM Admission Comments: No comment available   Primary Diagnosis:  <principal problem not specified> Principal Problem: <principal problem not specified>  Patient Active Problem List   Diagnosis Date Noted  . Acute blood loss anemia   . Dysphagia, post-stroke   . Balance problem   . Cerebrovascular accident (CVA) of right basal ganglia ([place]) 01/29/2019  . MPN (myeloproliferative neoplasm) ([place])   . Dysarthria 01/26/2019  . Coronary artery disease 01/26/2019  . History of stroke with residual deficit 01/26/2019  . CVA (cerebral vascular accident) ([place]) 01/26/2019  . Generalized weakness   . Closed displaced fracture of right femoral neck ([place])   . Hypokalemia   . Thrombocytosis ([place]) 06/20/2017  . Displaced fracture of right femoral neck ([place]) 06/20/2017  . NSTEMI (non-ST elevated myocardial infarction) ([place]) 04/15/2014  . Non-STEMI (non-ST elevated myocardial infarction) ([place]) 04/13/2014  . Hypertension   . CVA (cerebral infarction) 03/08/2014    Expected Discharge Date: Expected Discharge Date: 02/10/19  Team Members Present: Physician leading conference: Dr. Alysia Penna Social Worker Present: Ovidio Kin, LCSW Nurse Present: Rayetta Humphrey, RN PT Present: Phylliss Bob, PTA;Barrie Folk, PT OT Present: Darleen Crocker, OT SLP Present: Charolett Bumpers, SLP PPS Coordinator present : Gunnar Fusi     Current Status/Progress Goal Weekly Team Focus  Medical   abd hematoma, MBS now on D2 thin liquid  Manage pain from hematoma,   dysphagia,    Bowel/Bladder   Patient  continent of Bowel; LBM 4/6; Urinary retention with Bladder scan q4-6hr; scheduled medication for urinary retention  Patient remain continent of Bowel and  free or urinary retention  Assess bowel and Blader and provide treatment  ordered   Swallow/Nutrition/ Hydration   Dys 2 and thin via cup, sup A  Supervision      ADL's   supervision - CGA  supervision overall  NMR, self care retraining, balance, functional mobility, pt/family education   Mobility   CGA bed mobility and transfers, CGA gait with rollator 23f, 37/56 Berg on 4/7, on 4/7 pt unable to participate in therapies 2/2 fatigue and pain as recently diagnosed abdominal hematoma  supervision overall   activity tolerance, L NMR, gait, d/c planning    Communication   supervision A phrase level   Min A - goal met       Safety/Cognition/ Behavioral Observations            Pain   Patient denies pain  Patient remain free of pain  Assess pain q shift and as needed and provide prn medication as needed   Skin   Bilateral bruising to arms, bruising and hematoma to left lower abdomen quadrant, peri area swollen and bruised  Patient remain free of infection, skin breakdown and bruising  Assess skin q shift and as needed      *See Care Plan and progress notes for long and short-term goals.     Barriers to Discharge  Current Status/Progress Possible Resolutions Date Resolved   Physician    Medical stability     mild set back with  abd pain  ?ramp need      Nursing                  PT                    OT                  SLP                SW                Discharge Planning/Teaching Needs:  Granddaughter participating in therapies with pt and has ordered a rollator for home use. Medical issues now will await medical stability for DC home.      Team Discussion:  Medical issues with abdominal hematoma and Dc blood thinners. Foley being placed today due to urinary retention. Follow up with urologist as an OP. Met therapy goals  and family education completed with granddaughter. Monitor next couple days for medical stability.  Revisions to Treatment Plan:  DC 4/11    Continued Need for Acute Rehabilitation Level of Care: The patient requires daily medical management by a physician with specialized training in physical medicine and rehabilitation for the following conditions: Daily direction of a multidisciplinary physical rehabilitation program to ensure safe treatment while eliciting the highest outcome that is of practical value to the patient.: Yes Daily medical management of patient stability for increased activity during participation in an intensive rehabilitation regime.: Yes Daily analysis of laboratory values and/or radiology reports with any subsequent need for medication adjustment of medical intervention for : Neurological problems;Other   I attest that I was present, lead the team conference, and concur with the assessment and plan of the team. Teleconference held due to COVID-1Audry Riles, Gardiner Rhyme 02/07/2019, 10:47 AM

## 2019-02-07 NOTE — Progress Notes (Signed)
[place] PHYSICAL MEDICINE & REHABILITATION PROGRESS NOTE   Subjective/Complaints: Urinary retention issues persist  ROS:   Somnolent with language barrier, limited, per GRand daughter no pain at rest in abd  Objective:   Dg Abd 1 View  Result Date: 02/05/2019 CLINICAL DATA:  Bowel obstruction. EXAM: ABDOMEN - 1 VIEW COMPARISON:  No prior abdominal imaging. FINDINGS: No bowel dilatation to suggest obstruction. There is enteric contrast throughout the entire colon likely from modified barium swallow earlier this day. Trace residual barium within the terminal ileum, there is otherwise no small bowel retained oral contrast. No evidence of free air. Multiple pelvic phleboliths. Bilateral hip arthroplasties. IMPRESSION: No bowel obstruction. Enteric contrast throughout the colon from modified barium swallow earlier this day. Electronically Signed   By: Keith Rake M.D.   On: 02/05/2019 20:20   Ct Abdomen Pelvis W Contrast  Result Date: 02/06/2019 CLINICAL DATA:  Pulsatile abdominal mass EXAM: CT ABDOMEN AND PELVIS WITH CONTRAST TECHNIQUE: Multidetector CT imaging of the abdomen and pelvis was performed using the standard protocol following bolus administration of intravenous contrast. CONTRAST:  120m OMNIPAQUE IOHEXOL 300 MG/ML  SOLN COMPARISON:  None. FINDINGS: LOWER CHEST: There is no basilar pleural or apical pericardial effusion. HEPATOBILIARY: The hepatic contours and density are normal. There is no intra- or extrahepatic biliary dilatation. The gallbladder is normal. PANCREAS: The pancreatic parenchymal contours are normal and there is no ductal dilatation. There is no peripancreatic fluid collection. SPLEEN: Normal. ADRENALS/URINARY TRACT: --Adrenal glands: Normal. --Right kidney/ureter: Numerous small cysts. --Left kidney/ureter: Numerous small cysts. --Urinary bladder: Normal for degree of distention STOMACH/BOWEL: --Stomach/Duodenum: There is no hiatal hernia or other gastric abnormality.  The duodenal course and caliber are normal. --Small bowel: No dilatation or inflammation. --Colon: No focal abnormality. --Appendix: Not visualized. No right lower quadrant inflammation or free fluid. VASCULAR/LYMPHATIC: There is aortic atherosclerosis without hemodynamically significant stenosis. The portal vein, splenic vein, superior mesenteric vein and IVC are patent. No abdominal or pelvic lymphadenopathy. REPRODUCTIVE: No free fluid in the pelvis. MUSCULOSKELETAL. Multiple lumbar compression deformities, which appear chronic. OTHER: There is a left lower quadrant anterior abdominal wall hematoma that measures approximately 15 x 6 x 11 cm with evidence of active extravasation (8:29, 3:53). IMPRESSION: 1. Left lower quadrant anterior abdominal wall hematoma measuring 15 x 6 x 11 cm with active extravasation. 2. Numerous small bilateral renal cysts which are nonspecific but may be seen in the setting of lithium therapy or chronic dialysis. 3. Multiple age-indeterminate compression deformities of the lumbar spine, favored to be chronic. 4.  Aortic atherosclerosis (ICD10-I70.0). Critical Value/emergent results were called by telephone at the time of interpretation on 02/06/2019 at 2:11 am to [facility], who verbally acknowledged these results. Electronically Signed   By: KUlyses JarredM.D.   On: 02/06/2019 02:14   Dg Swallowing Func-speech Pathology  Result Date: 02/05/2019 Objective Swallowing Evaluation: Type of Study: MBS-Modified Barium Swallow Study  Patient Details Name: Jill WinslettMRN: [id number] of Birth: [birth year]Today's Date: 02/05/2019 Time: SLP Start Time (ACUTE ONLY): 1335 -SLP Stop Time (ACUTE ONLY): 1405 SLP Time Calculation (min) (ACUTE ONLY): 30 min Past Medical History: Past Medical History: Diagnosis Date . Cataract  . Complication of anesthesia   Difficulty waking up " . Coronary artery disease  . [place] . Hypertension  . Osteoporosis  . Stroke ([facility]   Past Surgical History: Past Surgical History: Procedure Laterality Date . ANTERIOR APPROACH HEMI HIP ARTHROPLASTY Right 06/20/2017  Procedure:  ANTERIOR APPROACH HEMI HIP ARTHROPLASTY;  Surgeon: Rod Can, MD;  Location: [facility];  Service: Orthopedics;  Laterality: Right; . FRACTURE SURGERY   . SHOULDER HARDWARE REMOVAL Left  . SHOULDER HEMI-ARTHROPLASTY Left  . TOTAL HIP ARTHROPLASTY Right  HPI: Pt is an 85 year old female who presents with slurred speech and difficulty swallowing. MRI showed  a new posterior limb of the internal capsule stroke on the right as well as a small area of cortical stroke in the right frontal cortex. PMH includes: CAD, HTN, CVA (with expressive aphasia that improved almost back to baseline per granddaughter, no other SLP needs at that time)  Subjective: alert, soft speech, also slurred per family that is interpreting Assessment / Plan / Recommendation CHL IP CLINICAL IMPRESSIONS 02/05/2019 Clinical Impression Pt presents with significant oral phase deficits that result in decreased bolus cohesion and widespread residue/management with nectar thick and honey thick liquids. As such, pt exhibits decreased bolus containment and difficulty posteriorally propelling bolus that result in premature spillage and piecemeal swallowing. Pt also demonstrates delayed swallow initiation to pyriform sinuses. The result of oral phase deficits and delayed swallow initiation results in penetration and eventual aspiration of nectar thick and honey thick liquids (PAS 7). Compensatory strategy of oral hold resulted in increased aspiration.  Pt with significantly delayed cough that was ineffective. However, when consuming small sips of thin liquids via cup, pt with decreased difficulty transitioning bolus posteriorally and she produced fewer piecemeal swallows with thin liquids. No penetration or aspiration were observed with thin liquids via cup sips. Multiple trials were given with no observed penetration or  aspiration. Pt intermittently coughed but no evidence of aspiration observed. Continue to recommend crusihgn medicine in puree d/t inability to organize bolus of whole pill with puree. Recommend dyphagia 2 with thin liquids via cup sips, medicine crushed with puree.  SLP Visit Diagnosis Dysphagia, oropharyngeal phase (R13.12) Attention and concentration deficit following -- Frontal lobe and executive function deficit following -- Impact on safety and function Mild aspiration risk   CHL IP TREATMENT RECOMMENDATION 02/05/2019 Treatment Recommendations Therapy as outlined in treatment plan below   Prognosis 01/26/2019 Prognosis for Safe Diet Advancement Good Barriers to Reach Goals -- Barriers/Prognosis Comment -- CHL IP DIET RECOMMENDATION 02/05/2019 SLP Diet Recommendations Dysphagia 2 (Fine chop) solids;Thin liquid Liquid Administration via Cup Medication Administration Crushed with puree Compensations Slow rate;Small sips/bites;Minimize environmental distractions Postural Changes Seated upright at 90 degrees   CHL IP OTHER RECOMMENDATIONS 02/05/2019 Recommended Consults -- Oral Care Recommendations Oral care BID Other Recommendations --   CHL IP FOLLOW UP RECOMMENDATIONS 01/27/2019 Follow up Recommendations Home health SLP;24 hour supervision/assistance   CHL IP FREQUENCY AND DURATION 01/26/2019 Speech Therapy Frequency (ACUTE ONLY) min 2x/week Treatment Duration 2 weeks      CHL IP ORAL PHASE 02/05/2019 Oral Phase Impaired Oral - Pudding Teaspoon -- Oral - Pudding Cup -- Oral - Honey Teaspoon Impaired mastication;Weak lingual manipulation;Reduced posterior propulsion;Piecemeal swallowing;Delayed oral transit;Decreased bolus cohesion;Premature spillage Oral - Honey Cup Impaired mastication;Weak lingual manipulation;Reduced posterior propulsion;Piecemeal swallowing;Delayed oral transit;Decreased bolus cohesion;Premature spillage Oral - Nectar Teaspoon Impaired mastication;Weak lingual manipulation;Reduced posterior  propulsion;Pocketing in anterior sulcus;Piecemeal swallowing;Delayed oral transit;Premature spillage;Decreased bolus cohesion Oral - Nectar Cup Weak lingual manipulation;Lingual pumping;Reduced posterior propulsion;Piecemeal swallowing;Delayed oral transit;Decreased bolus cohesion;Premature spillage;Impaired mastication Oral - Nectar Straw Impaired mastication;Weak lingual manipulation;Lingual pumping;Reduced posterior propulsion;Piecemeal swallowing;Delayed oral transit;Decreased bolus cohesion;Premature spillage Oral - Thin Teaspoon Weak lingual manipulation;Premature spillage;Decreased bolus cohesion;Reduced posterior propulsion Oral - Thin Cup Weak lingual manipulation;Reduced posterior propulsion;Premature spillage;Decreased  bolus cohesion Oral - Thin Straw -- Oral - Puree Weak lingual manipulation;Premature spillage;Reduced posterior propulsion Oral - Mech Soft Weak lingual manipulation;Premature spillage;Reduced posterior propulsion Oral - Regular -- Oral - Multi-Consistency -- Oral - Pill Weak lingual manipulation;Decreased bolus cohesion;Premature spillage;Delayed oral transit;Piecemeal swallowing;Impaired mastication Oral Phase - Comment --  CHL IP PHARYNGEAL PHASE 02/05/2019 Pharyngeal Phase Impaired Pharyngeal- Pudding Teaspoon -- Pharyngeal -- Pharyngeal- Pudding Cup -- Pharyngeal -- Pharyngeal- Honey Teaspoon Delayed swallow initiation-pyriform sinuses;Penetration/Aspiration during swallow;Pharyngeal residue - valleculae Pharyngeal Material enters airway, passes BELOW cords and not ejected out despite cough attempt by patient Pharyngeal- Honey Cup Delayed swallow initiation-pyriform sinuses;Penetration/Aspiration during swallow Pharyngeal Material enters airway, passes BELOW cords and not ejected out despite cough attempt by patient Pharyngeal- Nectar Teaspoon Delayed swallow initiation-pyriform sinuses;Penetration/Aspiration during swallow Pharyngeal Material enters airway, passes BELOW cords and not  ejected out despite cough attempt by patient Pharyngeal- Nectar Cup Delayed swallow initiation-pyriform sinuses;Penetration/Aspiration during swallow Pharyngeal Material enters airway, passes BELOW cords and not ejected out despite cough attempt by patient Pharyngeal- Nectar Straw Delayed swallow initiation-pyriform sinuses;Penetration/Aspiration during swallow Pharyngeal Material enters airway, passes BELOW cords and not ejected out despite cough attempt by patient Pharyngeal- Thin Teaspoon Delayed swallow initiation-pyriform sinuses Pharyngeal Material does not enter airway Pharyngeal- Thin Cup Delayed swallow initiation-pyriform sinuses Pharyngeal -- Pharyngeal- Thin Straw -- Pharyngeal -- Pharyngeal- Puree Delayed swallow initiation-vallecula;Pharyngeal residue - valleculae Pharyngeal -- Pharyngeal- Mechanical Soft Delayed swallow initiation-vallecula;Pharyngeal residue - valleculae Pharyngeal -- Pharyngeal- Regular -- Pharyngeal -- Pharyngeal- Multi-consistency -- Pharyngeal -- Pharyngeal- Pill (No Data) Pharyngeal -- Pharyngeal Comment --  CHL IP CERVICAL ESOPHAGEAL PHASE 02/05/2019 Cervical Esophageal Phase WFL Pudding Teaspoon -- Pudding Cup -- Honey Teaspoon -- Honey Cup -- Nectar Teaspoon -- Nectar Cup -- Nectar Straw -- Thin Teaspoon -- Thin Cup -- Thin Straw -- Puree -- Mechanical Soft -- Regular -- Multi-consistency -- Pill -- Cervical Esophageal Comment -- Happi Overton 02/05/2019, 12:00 PM              Recent Labs    02/06/19 0229 02/07/19 0520  WBC 11.3* 10.2  HGB 11.2* 9.6*  HCT 35.3* 30.9*  PLT 874* 806*   Recent Labs    02/05/19 0502  NA 138  K 4.1  CL 106  CO2 25  GLUCOSE 95  BUN 20  CREATININE 1.03*  CALCIUM 8.7*    Intake/Output Summary (Last 24 hours) at 02/07/2019 0918 Last data filed at 02/07/2019 0300 Gross per 24 hour  Intake 160 ml  Output 450 ml  Net -290 ml     Physical Exam: Vital Signs Blood pressure 124/71, pulse 87, temperature 98.2 F (36.8 C),  temperature source Oral, resp. rate 16, height 4' 8"  (1.422 m), weight 45.2 kg, SpO2 92 %.  Constitutional: NAD.  Vital signs reviewed. HENT: Normocephalic.  Atraumatic. Eyes: EOMI. No discharge Cardiovascular: No JVD    Respiratory: Normal effort    GU ecchymosis and swelling of labiaGI: moderate tenderness, firm mass in LUQ extending from lower costal margin to Left ASIS, to suprapubic area to midline on RIght  Neurological: Alert Motor: RLE 4+/5 proximal distal LLE 4+/5 prox to distal. Skin: Skin iswarmand dry.  Psychiatric: smiling, pleasant  Assessment/Plan: 1. Functional deficits secondary to right PLIC, caudate, parietal infarcts which require 3+ hours per day of interdisciplinary therapy in a comprehensive inpatient rehab setting.  Physiatrist is providing close team supervision and 24 hour management of active medical problems listed below.  Physiatrist and rehab team continue to assess barriers to  discharge/monitor patient progress toward functional and medical goals  Care Tool:  Bathing    Body parts bathed by patient: Right arm, Left arm, Chest, Abdomen, Front perineal area, Right upper leg, Left upper leg, Face, Right lower leg, Left lower leg, Buttocks   Body parts bathed by helper: Buttocks, Right lower leg, Left lower leg     Bathing assist Assist Level: Contact Guard/Touching assist     Upper Body Dressing/Undressing Upper body dressing Upper body dressing/undressing activity did not occur (including orthotics): Safety/medical concerns What is the patient wearing?: Button up shirt    Upper body assist Assist Level: Minimal Assistance - Patient > 75%    Lower Body Dressing/Undressing Lower body dressing      What is the patient wearing?: Incontinence brief, Pants     Lower body assist Assist for lower body dressing: Minimal Assistance - Patient > 75%     Toileting Toileting    Toileting assist Assist for toileting: Minimal Assistance - Patient >  75%     Transfers Chair/bed transfer  Transfers assist     Chair/bed transfer assist level: Contact Guard/Touching assist     Locomotion Ambulation   Ambulation assist      Assist level: Contact Guard/Touching assist Assistive device: Rollator Max distance: 261f   Walk 10 feet activity   Assist     Assist level: Contact Guard/Touching assist Assistive device: Rollator   Walk 50 feet activity   Assist    Assist level: Contact Guard/Touching assist Assistive device: Rollator    Walk 150 feet activity   Assist Walk 150 feet activity did not occur: Safety/medical concerns(decreased activity tolerance)  Assist level: Contact Guard/Touching assist Assistive device: Rollator    Walk 10 feet on uneven surface  activity   Assist     Assist level: Moderate Assistance - Patient - 50 - 74% Assistive device: WAeronautical engineerWill patient use wheelchair at discharge?: Yes Type of Wheelchair: Manual    Wheelchair assist level: Contact Guard/Touching assist Max wheelchair distance: 75    Wheelchair 50 feet with 2 turns activity    Assist        Assist Level: Contact Guard/Touching assist   Wheelchair 150 feet activity     Assist Wheelchair 150 feet activity did not occur: Safety/medical concerns(decreased activity tolerance)        Medical Problem List and Plan: 1.Left side weakness with dysarthria/dysphagiasecondary to acute infarctinthe right posterior limb of internal capsule, caudate body,and right parietal cortex. Plan 30 day event monitor on outpatient. Aspirin Plavix 3 weeks then Plavix alone Due to hematoma holding all anticoag for 1 wk  Continue CIR PT, OT  Team conference today please see physician documentation under team conference tab, met with team face-to-face to discuss problems,progress, and goals. Formulized individual treatment plan based on medical history, underlying problem and  comorbidities. 2. Antithrombotics: On hold due to large abd wall hematoma -DVT/anticoagulation:Subcutaneous Lovenox- d/c -antiplatelet therapy: Aspirin 81 mg daily, Plavix 75 mg daily, d/c x 1 wk then resume ASA only x 1 wk and then add plavix if no drop in Hgb 3. Pain Management:Tylenol as needed 4. Mood:Provide emotional support, granddaughter supportive -antipsychotic agents: N/A 5. Neuropsych: This patientiscapable of making decisions on herown behalf. 6. Skin/Wound Care:Routine skin checks 7. Fluids/Electrolytes/Nutrition:    -intake reasonable, granddaughter helps   -BUN/creatinine within normal limits on 4/2 and 4/6   8. Dysphagia. Dysphagia #1 nectar liquids.  Advanced to D2 honey, continue  to advance as tolerated  9. Hyperlipidemia. Lipitor 10. Thrombocytosis: Myeloproliferative neoplasm- appreciate Heme/Onc note  -platelets 886 on 4/2, 874 on 4/7, Anagrelide ordered per Heme but not in [place] is currently to follow-up outpatient for ongoing care(seen at [facility]).     -   -gdaughter pushing fluids -pt received 1/2 unit blood prior to discharge to rehab 3/31  -11. Essential hypertension. Patient on Norvasc 2.5 mg daily prior to admission.    Resumed  3/31 with improvement  Controlled on 4/7  12.  ABLA dropped due to hemodilution no active bleeding 13.  Urinary retention, discussed with Dr Alinda Money- pt has some labial ecchymosis from tracking of hematoma, will have foley placed will give ativan prior to procedure LOS: 9 days A FACE TO FACE EVALUATION WAS PERFORMED  Charlett Blake 02/07/2019, 9:18 AM

## 2019-02-07 NOTE — Progress Notes (Signed)
On report notified that attempt to insert I/O cath and place indwelling foley catheter unsuccessful. Writer plus RN in to speak with granddaughter who is at bedside about placing the indwelling, granddaughter refusing stating she wishes for urology to insert catheter only due to unsuccessful attempts, MD Kirstens made aware. Urologist consult in place. Will continue to monitor.

## 2019-02-08 ENCOUNTER — Inpatient Hospital Stay (HOSPITAL_COMMUNITY): Payer: Self-pay | Admitting: Physical Therapy

## 2019-02-08 ENCOUNTER — Inpatient Hospital Stay (HOSPITAL_COMMUNITY): Payer: Self-pay | Admitting: Speech Pathology

## 2019-02-08 ENCOUNTER — Inpatient Hospital Stay (HOSPITAL_COMMUNITY): Payer: Self-pay | Admitting: Occupational Therapy

## 2019-02-08 ENCOUNTER — Other Ambulatory Visit: Payer: Self-pay | Admitting: Hematology

## 2019-02-08 DIAGNOSIS — D471 Chronic myeloproliferative disease: Secondary | ICD-10-CM

## 2019-02-08 LAB — CBC
HCT: 30.7 % — ABNORMAL LOW (ref 36.0–46.0)
Hemoglobin: 9.5 g/dL — ABNORMAL LOW (ref 12.0–15.0)
MCH: 27.2 pg (ref 26.0–34.0)
MCHC: 30.9 g/dL (ref 30.0–36.0)
MCV: 88 fL (ref 80.0–100.0)
Platelets: 782 10*3/uL — ABNORMAL HIGH (ref 150–400)
RBC: 3.49 MIL/uL — ABNORMAL LOW (ref 3.87–5.11)
RDW: 15.3 % (ref 11.5–15.5)
WBC: 9.5 10*3/uL (ref 4.0–10.5)
nRBC: 0 % (ref 0.0–0.2)

## 2019-02-08 MED ORDER — TAMSULOSIN HCL 0.4 MG PO CAPS: 0.4000 mg | ORAL_CAPSULE | Freq: Every day | ORAL | 0 refills | Status: DC

## 2019-02-08 MED ORDER — ANAGRELIDE HCL 0.5 MG PO CAPS: 0.5000 mg | ORAL_CAPSULE | Freq: Two times a day (BID) | ORAL | 0 refills | Status: DC

## 2019-02-08 MED ORDER — PANTOPRAZOLE SODIUM 40 MG PO TBEC: 40.0000 mg | DELAYED_RELEASE_TABLET | Freq: Every day | ORAL | 0 refills | Status: DC

## 2019-02-08 MED ORDER — ATORVASTATIN CALCIUM 40 MG PO TABS: 40.0000 mg | ORAL_TABLET | Freq: Every day | ORAL | 0 refills | Status: DC

## 2019-02-08 MED ORDER — AMLODIPINE BESYLATE 2.5 MG PO TABS: 2.5000 mg | ORAL_TABLET | Freq: Every day | ORAL | 1 refills | Status: DC

## 2019-02-08 NOTE — Progress Notes (Signed)
Physical Therapy Session Note  Patient Details  Name: Jill Castaneda MRN: [id number] Date of Birth: 09/14/1928  Today's Date: 02/08/2019 PT Individual Time: 4818-5909 PT Individual Time Calculation (min): 60 min   Short Term Goals: Week 1:  PT Short Term Goal 1 (Week 1): Patient will perform sitting balance without UE support for >2 minutes while maintaining midline without cuing. PT Short Term Goal 2 (Week 1): Patient will perform transfers from various household surfaces and car transfers with CGA for safety and balance. PT Short Term Goal 3 (Week 1): Patient will ambulate >46' with CGA for safety using LRAD. PT Short Term Goal 4 (Week 1): Patient will go up and down 4 steps using 1 rail and/or LRAD with CGA for safety and balance.  Skilled Therapeutic Interventions/Progress Updates:   Pt received supine in bed and agreeable to PT. Supine>sit transfer with CGA and min cues for use of UE.   Gait training with rollator to and form rehab gym 2x 170f and supervision assist. Weave through 4 cones x 4 with Rollator and supervision assist. Min cues for awareness of obstacles on the L. Stepping over 4 one inch thresholds x 2 with CGA assist and Rollator; moderate cues for AD management and improved technique to improve safety and reduce fatigue.   Stair management training with min assist for use of 1 rail and 1 HHA on the L from grand daughter. Min cues for for foley management and positioning for Improved safety with guarding.   Dynamic balance training to tap ball to target, up/down x 10, diagonals x 10 each and laterally R and L x 10 each. Supervision assist from PT for safety with min cues for improved R rotation and weight shift R. Lateral reach to reach then toss horseshoes x 8 bil. Min cues for posture from PT and to return to midline after reaching to the R.   Patient returned to room and left sitting on couch with call bell in reach and all needs met.            Therapy  Documentation Precautions:  Precautions Precautions: Fall Restrictions Weight Bearing Restrictions: No    Pain: Pain Assessment Pain Scale: 0-10 Pain Score: 4  Pain Type: Chronic pain Pain Location: Abdomen Pain Orientation: Right Pain Descriptors / Indicators: Discomfort Pain Onset: With Activity Patients Stated Pain Goal: 0 Pain Intervention(s): Other (Comment)(applied ace wrap for support to abdomen )    Therapy/Group: Individual Therapy  ALorie Phenix4/07/2019, 4:15 PM

## 2019-02-08 NOTE — Discharge Summary (Signed)
Physician Discharge Summary  Patient ID: Jill Castaneda MRN: [id number] DOB/AGE: 02-09-1928 85 y.o.  Admit date: 01/29/2019 Discharge date: 02/09/2019  Discharge Diagnoses:  Active Problems:   Cerebrovascular accident (CVA) of right basal ganglia (HCC)   Dysphagia, post-stroke   Balance problem   Acute blood loss anemia thrombocytosis/myeloproliferative neoplasm Urinary retention Intra-abdominal wall hematoma Acute blood loss anemia Decreased nutritional storage  Discharged Condition: stable  Significant Diagnostic Studies: Ct Angio Head W Or Wo Contrast  Result Date: 01/26/2019 CLINICAL DATA:  Stroke EXAM: CT ANGIOGRAPHY HEAD AND NECK TECHNIQUE: Multidetector CT imaging of the head and neck was performed using the standard protocol during bolus administration of intravenous contrast. Multiplanar CT image reconstructions and MIPs were obtained to evaluate the vascular anatomy. Carotid stenosis measurements (when applicable) are obtained utilizing NASCET criteria, using the distal internal carotid diameter as the denominator. CONTRAST:  65mL OMNIPAQUE IOHEXOL 350 MG/ML SOLN COMPARISON:  MRI head 01/26/2019.  CT head 01/25/2019 FINDINGS: CTA NECK FINDINGS Aortic arch: Mild atherosclerotic disease aortic arch without aneurysm or dissection. Proximal great vessels widely patent Right carotid system: Widely patent without atherosclerotic disease or dissection Left carotid system: Widely patent without atherosclerotic disease or dissection Vertebral arteries: Both vertebral arteries widely patent to the basilar Skeleton: Mild cervical spine degenerative change. No acute skeletal abnormality. Other neck: Negative Upper chest: Lung apices clear bilaterally. Review of the MIP images confirms the above findings CTA HEAD FINDINGS Anterior circulation: No significant stenosis, proximal occlusion, aneurysm, or vascular malformation. Posterior circulation: No significant stenosis, proximal occlusion,  aneurysm, or vascular malformation. Venous sinuses: Not well evaluated due to arterial phase scanning Anatomic variants: None Delayed phase: No acute intracranial abnormality. Extensive chronic microvascular ischemia. Review of the MIP images confirms the above findings IMPRESSION: 1. Negative for large vessel occlusion. Acute infarcts on the right are consistent with small vessel occlusion. Extensive chronic microvascular ischemic change in the brain 2. No significant carotid or vertebral atherosclerotic disease in the neck. Electronically Signed   By: Franchot Gallo M.D.   On: 01/26/2019 13:07   Dg Chest 2 View  Result Date: 01/26/2019 CLINICAL DATA:  Altered mental status. EXAM: CHEST - 2 VIEW COMPARISON:  Chest radiograph 06/19/2017. CT 06/23/2017 FINDINGS: The patient is rotated. Heart size normal for technique. Aortic tortuosity. No pulmonary edema. No focal airspace disease. No pleural effusion or pneumothorax. Bones are under mineralized with scoliosis. IMPRESSION: Rotated exam without acute abnormality. Electronically Signed   By: Keith Rake M.D.   On: 01/26/2019 00:14   Dg Abd 1 View  Result Date: 02/05/2019 CLINICAL DATA:  Bowel obstruction. EXAM: ABDOMEN - 1 VIEW COMPARISON:  No prior abdominal imaging. FINDINGS: No bowel dilatation to suggest obstruction. There is enteric contrast throughout the entire colon likely from modified barium swallow earlier this day. Trace residual barium within the terminal ileum, there is otherwise no small bowel retained oral contrast. No evidence of free air. Multiple pelvic phleboliths. Bilateral hip arthroplasties. IMPRESSION: No bowel obstruction. Enteric contrast throughout the colon from modified barium swallow earlier this day. Electronically Signed   By: Keith Rake M.D.   On: 02/05/2019 20:20   Ct Angio Neck W Or Wo Contrast  Result Date: 01/26/2019 CLINICAL DATA:  Stroke EXAM: CT ANGIOGRAPHY HEAD AND NECK TECHNIQUE: Multidetector CT imaging  of the head and neck was performed using the standard protocol during bolus administration of intravenous contrast. Multiplanar CT image reconstructions and MIPs were obtained to evaluate the vascular anatomy. Carotid stenosis measurements (when applicable) are obtained  utilizing NASCET criteria, using the distal internal carotid diameter as the denominator. CONTRAST:  42mL OMNIPAQUE IOHEXOL 350 MG/ML SOLN COMPARISON:  MRI head 01/26/2019.  CT head 01/25/2019 FINDINGS: CTA NECK FINDINGS Aortic arch: Mild atherosclerotic disease aortic arch without aneurysm or dissection. Proximal great vessels widely patent Right carotid system: Widely patent without atherosclerotic disease or dissection Left carotid system: Widely patent without atherosclerotic disease or dissection Vertebral arteries: Both vertebral arteries widely patent to the basilar Skeleton: Mild cervical spine degenerative change. No acute skeletal abnormality. Other neck: Negative Upper chest: Lung apices clear bilaterally. Review of the MIP images confirms the above findings CTA HEAD FINDINGS Anterior circulation: No significant stenosis, proximal occlusion, aneurysm, or vascular malformation. Posterior circulation: No significant stenosis, proximal occlusion, aneurysm, or vascular malformation. Venous sinuses: Not well evaluated due to arterial phase scanning Anatomic variants: None Delayed phase: No acute intracranial abnormality. Extensive chronic microvascular ischemia. Review of the MIP images confirms the above findings IMPRESSION: 1. Negative for large vessel occlusion. Acute infarcts on the right are consistent with small vessel occlusion. Extensive chronic microvascular ischemic change in the brain 2. No significant carotid or vertebral atherosclerotic disease in the neck. Electronically Signed   By: Franchot Gallo M.D.   On: 01/26/2019 13:07   Mr Brain Wo Contrast  Result Date: 01/26/2019 CLINICAL DATA:  Dysarthria.  History of stroke. EXAM:  MRI HEAD WITHOUT CONTRAST TECHNIQUE: Multiplanar, multiecho pulse sequences of the brain and surrounding structures were obtained without intravenous contrast. COMPARISON:  CT head 01/25/2019.  MRI head 03/09/2014 FINDINGS: Brain: Acute infarct in the right basal ganglia involving the posterior limb internal capsule and caudate body. Small area of acute infarct in the right parietal cortex. Mild atrophy. Extensive chronic ischemic changes throughout the white matter. Chronic infarcts in the thalamus bilaterally. Small chronic infarcts in the cerebellum bilaterally. Chronic infarct in the left midbrain. Negative for hemorrhage mass or midline shift. Vascular: Normal arterial flow voids Skull and upper cervical spine: Negative Sinuses/Orbits: Mild mucosal edema paranasal sinuses. Bilateral cataract surgery Other: None IMPRESSION: Acute infarct in the posterior limb internal capsule on the right extending into the caudate body. Small area of acute infarct right parietal cortex Advanced chronic microvascular ischemia. Electronically Signed   By: Franchot Gallo M.D.   On: 01/26/2019 07:10   Ct Abdomen Pelvis W Contrast  Result Date: 02/06/2019 CLINICAL DATA:  Pulsatile abdominal mass EXAM: CT ABDOMEN AND PELVIS WITH CONTRAST TECHNIQUE: Multidetector CT imaging of the abdomen and pelvis was performed using the standard protocol following bolus administration of intravenous contrast. CONTRAST:  129mL OMNIPAQUE IOHEXOL 300 MG/ML  SOLN COMPARISON:  None. FINDINGS: LOWER CHEST: There is no basilar pleural or apical pericardial effusion. HEPATOBILIARY: The hepatic contours and density are normal. There is no intra- or extrahepatic biliary dilatation. The gallbladder is normal. PANCREAS: The pancreatic parenchymal contours are normal and there is no ductal dilatation. There is no peripancreatic fluid collection. SPLEEN: Normal. ADRENALS/URINARY TRACT: --Adrenal glands: Normal. --Right kidney/ureter: Numerous small cysts.  --Left kidney/ureter: Numerous small cysts. --Urinary bladder: Normal for degree of distention STOMACH/BOWEL: --Stomach/Duodenum: There is no hiatal hernia or other gastric abnormality. The duodenal course and caliber are normal. --Small bowel: No dilatation or inflammation. --Colon: No focal abnormality. --Appendix: Not visualized. No right lower quadrant inflammation or free fluid. VASCULAR/LYMPHATIC: There is aortic atherosclerosis without hemodynamically significant stenosis. The portal vein, splenic vein, superior mesenteric vein and IVC are patent. No abdominal or pelvic lymphadenopathy. REPRODUCTIVE: No free fluid in the pelvis. MUSCULOSKELETAL. Multiple  lumbar compression deformities, which appear chronic. OTHER: There is a left lower quadrant anterior abdominal wall hematoma that measures approximately 15 x 6 x 11 cm with evidence of active extravasation (8:29, 3:53). IMPRESSION: 1. Left lower quadrant anterior abdominal wall hematoma measuring 15 x 6 x 11 cm with active extravasation. 2. Numerous small bilateral renal cysts which are nonspecific but may be seen in the setting of lithium therapy or chronic dialysis. 3. Multiple age-indeterminate compression deformities of the lumbar spine, favored to be chronic. 4.  Aortic atherosclerosis (ICD10-I70.0). Critical Value/emergent results were called by telephone at the time of interpretation on 02/06/2019 at 2:11 am to [facility] , who verbally acknowledged these results. Electronically Signed   By: Ulyses Jarred M.D.   On: 02/06/2019 02:14   Dg Chest Port 1 View  Result Date: 01/27/2019 CLINICAL DATA:  Shortness of breath. EXAM: PORTABLE CHEST 1 VIEW COMPARISON:  Frontal and lateral views yesterday. FINDINGS: The cardiomediastinal contours are normal for AP technique. The lungs are clear. Pulmonary vasculature is normal. No consolidation, pleural effusion, or pneumothorax. No acute osseous abnormalities are seen. Unchanged cortical thickening in the  left mid humerus likely at the deltoid insertion. Scoliotic curvature in the spine. IMPRESSION: No acute chest findings. Electronically Signed   By: Keith Rake M.D.   On: 01/27/2019 21:34   Dg Swallowing Func-speech Pathology  Result Date: 02/05/2019 Objective Swallowing Evaluation: Type of Study: MBS-Modified Barium Swallow Study  Patient Details Name: Jill Castaneda MRN: [id number] Date of Birth: 11/24/1927 Today's Date: 02/05/2019 Time: SLP Start Time (ACUTE ONLY): 1335 -SLP Stop Time (ACUTE ONLY): 1405 SLP Time Calculation (min) (ACUTE ONLY): 30 min Past Medical History: Past Medical History: Diagnosis Date . Cataract  . Complication of anesthesia   Difficulty waking up " . Coronary artery disease  . Hip fracture ([place]) 06/2017  right hip . Hypertension  . Osteoporosis  . Stroke [facility])  Past Surgical History: Past Surgical History: Procedure Laterality Date . ANTERIOR APPROACH HEMI HIP ARTHROPLASTY Right 06/20/2017  Procedure: ANTERIOR APPROACH HEMI HIP ARTHROPLASTY;  Surgeon: Rod Can, MD;  Location: [place];  Service: Orthopedics;  Laterality: Right; . FRACTURE SURGERY   . SHOULDER HARDWARE REMOVAL Left  . SHOULDER HEMI-ARTHROPLASTY Left  . TOTAL HIP ARTHROPLASTY Right  HPI: Pt is an 85 year old female who presents with slurred speech and difficulty swallowing. MRI showed  a new posterior limb of the internal capsule stroke on the right as well as a small area of cortical stroke in the right frontal cortex. PMH includes: CAD, HTN, CVA (with expressive aphasia that improved almost back to baseline per granddaughter, no other SLP needs at that time)  Subjective: alert, soft speech, also slurred per family that is interpreting Assessment / Plan / Recommendation CHL IP CLINICAL IMPRESSIONS 02/05/2019 Clinical Impression Pt presents with significant oral phase deficits that result in decreased bolus cohesion and widespread residue/management with nectar thick and honey thick liquids. As such, pt exhibits  decreased bolus containment and difficulty posteriorally propelling bolus that result in premature spillage and piecemeal swallowing. Pt also demonstrates delayed swallow initiation to pyriform sinuses. The result of oral phase deficits and delayed swallow initiation results in penetration and eventual aspiration of nectar thick and honey thick liquids (PAS 7). Compensatory strategy of oral hold resulted in increased aspiration.  Pt with significantly delayed cough that was ineffective. However, when consuming small sips of thin liquids via cup, pt with decreased difficulty transitioning bolus posteriorally and she produced fewer piecemeal swallows with thin  liquids. No penetration or aspiration were observed with thin liquids via cup sips. Multiple trials were given with no observed penetration or aspiration. Pt intermittently coughed but no evidence of aspiration observed. Continue to recommend crusihgn medicine in puree d/t inability to organize bolus of whole pill with puree. Recommend dyphagia 2 with thin liquids via cup sips, medicine crushed with puree.  SLP Visit Diagnosis Dysphagia, oropharyngeal phase (R13.12) Attention and concentration deficit following -- Frontal lobe and executive function deficit following -- Impact on safety and function Mild aspiration risk   CHL IP TREATMENT RECOMMENDATION 02/05/2019 Treatment Recommendations Therapy as outlined in treatment plan below   Prognosis 01/26/2019 Prognosis for Safe Diet Advancement Good Barriers to Reach Goals -- Barriers/Prognosis Comment -- CHL IP DIET RECOMMENDATION 02/05/2019 SLP Diet Recommendations Dysphagia 2 (Fine chop) solids;Thin liquid Liquid Administration via Cup Medication Administration Crushed with puree Compensations Slow rate;Small sips/bites;Minimize environmental distractions Postural Changes Seated upright at 90 degrees   CHL IP OTHER RECOMMENDATIONS 02/05/2019 Recommended Consults -- Oral Care Recommendations Oral care BID Other  Recommendations --   CHL IP FOLLOW UP RECOMMENDATIONS 01/27/2019 Follow up Recommendations Home health SLP;24 hour supervision/assistance   CHL IP FREQUENCY AND DURATION 01/26/2019 Speech Therapy Frequency (ACUTE ONLY) min 2x/week Treatment Duration 2 weeks      CHL IP ORAL PHASE 02/05/2019 Oral Phase Impaired Oral - Pudding Teaspoon -- Oral - Pudding Cup -- Oral - Honey Teaspoon Impaired mastication;Weak lingual manipulation;Reduced posterior propulsion;Piecemeal swallowing;Delayed oral transit;Decreased bolus cohesion;Premature spillage Oral - Honey Cup Impaired mastication;Weak lingual manipulation;Reduced posterior propulsion;Piecemeal swallowing;Delayed oral transit;Decreased bolus cohesion;Premature spillage Oral - Nectar Teaspoon Impaired mastication;Weak lingual manipulation;Reduced posterior propulsion;Pocketing in anterior sulcus;Piecemeal swallowing;Delayed oral transit;Premature spillage;Decreased bolus cohesion Oral - Nectar Cup Weak lingual manipulation;Lingual pumping;Reduced posterior propulsion;Piecemeal swallowing;Delayed oral transit;Decreased bolus cohesion;Premature spillage;Impaired mastication Oral - Nectar Straw Impaired mastication;Weak lingual manipulation;Lingual pumping;Reduced posterior propulsion;Piecemeal swallowing;Delayed oral transit;Decreased bolus cohesion;Premature spillage Oral - Thin Teaspoon Weak lingual manipulation;Premature spillage;Decreased bolus cohesion;Reduced posterior propulsion Oral - Thin Cup Weak lingual manipulation;Reduced posterior propulsion;Premature spillage;Decreased bolus cohesion Oral - Thin Straw -- Oral - Puree Weak lingual manipulation;Premature spillage;Reduced posterior propulsion Oral - Mech Soft Weak lingual manipulation;Premature spillage;Reduced posterior propulsion Oral - Regular -- Oral - Multi-Consistency -- Oral - Pill Weak lingual manipulation;Decreased bolus cohesion;Premature spillage;Delayed oral transit;Piecemeal swallowing;Impaired  mastication Oral Phase - Comment --  CHL IP PHARYNGEAL PHASE 02/05/2019 Pharyngeal Phase Impaired Pharyngeal- Pudding Teaspoon -- Pharyngeal -- Pharyngeal- Pudding Cup -- Pharyngeal -- Pharyngeal- Honey Teaspoon Delayed swallow initiation-pyriform sinuses;Penetration/Aspiration during swallow;Pharyngeal residue - valleculae Pharyngeal Material enters airway, passes BELOW cords and not ejected out despite cough attempt by patient Pharyngeal- Honey Cup Delayed swallow initiation-pyriform sinuses;Penetration/Aspiration during swallow Pharyngeal Material enters airway, passes BELOW cords and not ejected out despite cough attempt by patient Pharyngeal- Nectar Teaspoon Delayed swallow initiation-pyriform sinuses;Penetration/Aspiration during swallow Pharyngeal Material enters airway, passes BELOW cords and not ejected out despite cough attempt by patient Pharyngeal- Nectar Cup Delayed swallow initiation-pyriform sinuses;Penetration/Aspiration during swallow Pharyngeal Material enters airway, passes BELOW cords and not ejected out despite cough attempt by patient Pharyngeal- Nectar Straw Delayed swallow initiation-pyriform sinuses;Penetration/Aspiration during swallow Pharyngeal Material enters airway, passes BELOW cords and not ejected out despite cough attempt by patient Pharyngeal- Thin Teaspoon Delayed swallow initiation-pyriform sinuses Pharyngeal Material does not enter airway Pharyngeal- Thin Cup Delayed swallow initiation-pyriform sinuses Pharyngeal -- Pharyngeal- Thin Straw -- Pharyngeal -- Pharyngeal- Puree Delayed swallow initiation-vallecula;Pharyngeal residue - valleculae Pharyngeal -- Pharyngeal- Mechanical Soft Delayed swallow initiation-vallecula;Pharyngeal residue - valleculae Pharyngeal -- Pharyngeal- Regular --  Pharyngeal -- Pharyngeal- Multi-consistency -- Pharyngeal -- Pharyngeal- Pill (No Data) Pharyngeal -- Pharyngeal Comment --  CHL IP CERVICAL ESOPHAGEAL PHASE 02/05/2019 Cervical Esophageal Phase WFL  Pudding Teaspoon -- Pudding Cup -- Honey Teaspoon -- Honey Cup -- Nectar Teaspoon -- Nectar Cup -- Nectar Straw -- Thin Teaspoon -- Thin Cup -- Thin Straw -- Puree -- Mechanical Soft -- Regular -- Multi-consistency -- Pill -- Cervical Esophageal Comment -- Jill Castaneda 02/05/2019, 12:00 PM              Dg Swallowing Func-speech Pathology  Result Date: 01/27/2019 Objective Swallowing Evaluation: Type of Study: MBS-Modified Barium Swallow Study  Patient Details Name: Jill Castaneda MRN: [id number] Date of Birth: August 31, 1928 Today's Date: 01/27/2019 Time: SLP Start Time (ACUTE ONLY): 1211 -SLP Stop Time (ACUTE ONLY): 1234 SLP Time Calculation (min) (ACUTE ONLY): 23 min Past Medical History: Past Medical History: Diagnosis Date . Cataract  . Complication of anesthesia   Difficulty waking up " . Coronary artery disease  . Hip fracture ([place]) 06/2017  right hip . Hypertension  . Osteoporosis  . Stroke [facility])  Past Surgical History: Past Surgical History: Procedure Laterality Date . ANTERIOR APPROACH HEMI HIP ARTHROPLASTY Right 06/20/2017  Procedure: ANTERIOR APPROACH HEMI HIP ARTHROPLASTY;  Surgeon: Rod Can, MD;  Location: [place];  Service: Orthopedics;  Laterality: Right; . FRACTURE SURGERY   . SHOULDER HARDWARE REMOVAL Left  . SHOULDER HEMI-ARTHROPLASTY Left  . TOTAL HIP ARTHROPLASTY Right  HPI: Pt is an 85 year old female who presents with slurred speech and difficulty swallowing. MRI showed  a new posterior limb of the internal capsule stroke on the right as well as a small area of cortical stroke in the right frontal cortex. PMH includes: CAD, HTN, CVA (with expressive aphasia that improved almost back to baseline per granddaughter, no other SLP needs at that time)  Subjective: alert, soft speech, also slurred per family that is interpreting Assessment / Plan / Recommendation CHL IP CLINICAL IMPRESSIONS 01/26/2019 Clinical Impression Pt has a moderate oral and mild pharyngeal dysphagia with generalized fatigue that  also likely contributes to aspiration risk. Her granddaughter was present for education as well as translation. She will only take very small bites/sips, resisting SLP if attempting to provide larger boluses, and she could not suck thickened liquid up via straw. She has weak lingual manipulation that results in poor bolus cohesion, incomplete posterior transit that leaves oral residue, and premature spillage that reaches the pyriform sinuses before the swallow. Premature spillage with thin liquids spills directly into the airway before the swallow. Pt has a cough reaction (1-2 seconds delayed) but this does not move aspirated material. Nectar thick liquids and purees are better contained above the valleculae and do not enter the airway. Recommend starting Dys 1 diet and nectar thick liquids by cup or spoon. Suspect that pt will need smaller, more frequent offerings of food/liquid so that she does not fatigue across a meal, as she appeared to fatigue even during this study. Fatigue could further increase her risk of aspiration. Diet and precautions were reviewed with handouts provided. Will continue to follow acutely. SLP Visit Diagnosis Dysphagia, oropharyngeal phase (R13.12) Attention and concentration deficit following -- Frontal lobe and executive function deficit following -- Impact on safety and function Mild aspiration risk;Moderate aspiration risk;Risk for inadequate nutrition/hydration   CHL IP TREATMENT RECOMMENDATION 01/26/2019 Treatment Recommendations Therapy as outlined in treatment plan below   Prognosis 01/26/2019 Prognosis for Safe Diet Advancement Good Barriers to Reach Goals -- Barriers/Prognosis Comment --  CHL IP DIET RECOMMENDATION 01/26/2019 SLP Diet Recommendations Dysphagia 1 (Puree) solids;Nectar thick liquid Liquid Administration via Cup;Spoon;No straw Medication Administration Crushed with puree Compensations Slow rate;Small sips/bites Postural Changes Seated upright at 90 degrees   CHL IP  OTHER RECOMMENDATIONS 01/26/2019 Recommended Consults -- Oral Care Recommendations Oral care BID Other Recommendations Order thickener from pharmacy;Prohibited food (jello, ice cream, thin soups);Remove water pitcher   CHL IP FOLLOW UP RECOMMENDATIONS 01/26/2019 Follow up Recommendations Home health SLP;24 hour supervision/assistance   CHL IP FREQUENCY AND DURATION 01/26/2019 Speech Therapy Frequency (ACUTE ONLY) min 2x/week Treatment Duration 2 weeks      CHL IP ORAL PHASE 01/26/2019 Oral Phase Impaired Oral - Pudding Teaspoon -- Oral - Pudding Cup -- Oral - Honey Teaspoon -- Oral - Honey Cup -- Oral - Nectar Teaspoon Weak lingual manipulation;Reduced posterior propulsion;Lingual/palatal residue;Delayed oral transit;Decreased bolus cohesion;Premature spillage Oral - Nectar Cup Weak lingual manipulation;Reduced posterior propulsion;Lingual/palatal residue;Delayed oral transit;Decreased bolus cohesion;Premature spillage Oral - Nectar Straw -- Oral - Thin Teaspoon Weak lingual manipulation;Reduced posterior propulsion;Lingual/palatal residue;Delayed oral transit;Decreased bolus cohesion;Premature spillage Oral - Thin Cup -- Oral - Thin Straw -- Oral - Puree Weak lingual manipulation;Reduced posterior propulsion;Lingual/palatal residue;Delayed oral transit;Decreased bolus cohesion;Premature spillage Oral - Mech Soft -- Oral - Regular -- Oral - Multi-Consistency -- Oral - Pill -- Oral Phase - Comment --  CHL IP PHARYNGEAL PHASE 01/26/2019 Pharyngeal Phase Impaired Pharyngeal- Pudding Teaspoon -- Pharyngeal -- Pharyngeal- Pudding Cup -- Pharyngeal -- Pharyngeal- Honey Teaspoon -- Pharyngeal -- Pharyngeal- Honey Cup -- Pharyngeal -- Pharyngeal- Nectar Teaspoon Reduced tongue base retraction Pharyngeal -- Pharyngeal- Nectar Cup Reduced tongue base retraction Pharyngeal -- Pharyngeal- Nectar Straw -- Pharyngeal -- Pharyngeal- Thin Teaspoon Reduced tongue base retraction;Penetration/Aspiration before swallow Pharyngeal Material  enters airway, passes BELOW cords and not ejected out despite cough attempt by patient Pharyngeal- Thin Cup -- Pharyngeal -- Pharyngeal- Thin Straw -- Pharyngeal -- Pharyngeal- Puree Reduced tongue base retraction;Pharyngeal residue - valleculae Pharyngeal -- Pharyngeal- Mechanical Soft -- Pharyngeal -- Pharyngeal- Regular -- Pharyngeal -- Pharyngeal- Multi-consistency -- Pharyngeal -- Pharyngeal- Pill -- Pharyngeal -- Pharyngeal Comment --  CHL IP CERVICAL ESOPHAGEAL PHASE 01/26/2019 Cervical Esophageal Phase WFL Pudding Teaspoon -- Pudding Cup -- Honey Teaspoon -- Honey Cup -- Nectar Teaspoon -- Nectar Cup -- Nectar Straw -- Thin Teaspoon -- Thin Cup -- Thin Straw -- Puree -- Mechanical Soft -- Regular -- Multi-consistency -- Pill -- Cervical Esophageal Comment -- Jill Castaneda 01/27/2019, 11:08 AM              Ct Head Code Stroke Wo Contrast  Result Date: 01/25/2019 CLINICAL DATA:  Code stroke. 85 year old female with slurred speech and right side weakness. EXAM: CT HEAD WITHOUT CONTRAST TECHNIQUE: Contiguous axial images were obtained from the base of the skull through the vertex without intravenous contrast. COMPARISON:  Head CT 08/18/2014. Brain MRI 03/09/2014. FINDINGS: Brain: Cerebral volume is not significantly changed. No ventriculomegaly. No acute intracranial hemorrhage identified. No midline shift, mass effect, or evidence of intracranial mass lesion. 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 Small areas of cortical and subcortical white matter hypodensity/encephalomalacia in the posterior left MCA territory on series 2, image 20 and 18 appear increased but not brand new. There is a similar chronic appearing much smaller right parietal cortical infarct best seen on sagittal image 8. No other cortically based infarct identified. Vascular: Tortuous.  Calcified  atherosclerosis at the skull base. No suspicious intracranial vascular hyperdensity. Skull: No acute osseous abnormality identified. Sinuses/Orbits: Visualized paranasal sinuses and mastoids are stable and well pneumatized. Other: Small nonspecific left forehead scalp soft tissue polypoid lesion on series 3, image 52 is new since 2015 but probably benign. Interval postoperative changes to both globes. ASPECTS [facility] Stroke Program Early CT Score) - Ganglionic level infarction (caudate, lentiform nuclei, internal capsule, insula, M1-M3 cortex): - Supraganglionic infarction (M4-M6 cortex): Total score (0-10 with 10 being normal): IMPRESSION: 1. Advanced chronic ischemic disease with progression in both cerebral and cerebellar hemispheres since 2015. Several bilateral areas are age indeterminate. 2. No acute intracranial hemorrhage or mass effect. 3.  No suspicious intracranial vascular hyperdensity. Study discussed by telephone with Dr. Virgel Manifold on 01/25/2019 at 22:40 . Electronically Signed   By: Genevie Ann M.D.   On: 01/25/2019 22:41   Vas [place] Lower Extremity Venous (dvt)  Result Date: 01/28/2019  Lower Venous Study Indications: Embolic stroke.  Performing Technologist: Antonieta Pert RDMS, RVT  Examination Guidelines: A complete evaluation includes B-mode imaging, spectral Doppler, color Doppler, and power Doppler as needed of all accessible portions of each vessel. Bilateral testing is considered an integral part of a complete examination. Limited examinations for reoccurring indications may be performed as noted.  Right Venous Findings: +---------+---------------+---------+-----------+----------+-------------------+          CompressibilityPhasicitySpontaneityPropertiesSummary             +---------+---------------+---------+-----------+----------+-------------------+ CFV      Full           Yes      Yes                                       +---------+---------------+---------+-----------+----------+-------------------+ SFJ      Full                                                             +---------+---------------+---------+-----------+----------+-------------------+ FV Prox  Full                                                             +---------+---------------+---------+-----------+----------+-------------------+ FV Mid   Full                                                             +---------+---------------+---------+-----------+----------+-------------------+ FV DistalFull                                                             +---------+---------------+---------+-----------+----------+-------------------+ PFV      Full                                                             +---------+---------------+---------+-----------+----------+-------------------+  POP      Full           Yes      Yes                                      +---------+---------------+---------+-----------+----------+-------------------+ PTV      Full                                                             +---------+---------------+---------+-----------+----------+-------------------+ PERO                                                  not well visualized +---------+---------------+---------+-----------+----------+-------------------+ GSV      Full                                                             +---------+---------------+---------+-----------+----------+-------------------+  Left Venous Findings: +---------+---------------+---------+-----------+----------+-------+          CompressibilityPhasicitySpontaneityPropertiesSummary +---------+---------------+---------+-----------+----------+-------+ CFV      Full           Yes      Yes                          +---------+---------------+---------+-----------+----------+-------+ SFJ      Full                                                  +---------+---------------+---------+-----------+----------+-------+ FV Prox  Full                                                 +---------+---------------+---------+-----------+----------+-------+ FV Mid   Full                                                 +---------+---------------+---------+-----------+----------+-------+ FV DistalFull                                                 +---------+---------------+---------+-----------+----------+-------+ PFV      Full                                                 +---------+---------------+---------+-----------+----------+-------+ POP      Full  Yes      Yes                          +---------+---------------+---------+-----------+----------+-------+ PTV      Full                                                 +---------+---------------+---------+-----------+----------+-------+ PERO     Full                                                 +---------+---------------+---------+-----------+----------+-------+ GSV      Full                                                 +---------+---------------+---------+-----------+----------+-------+    Summary: Right: There is no evidence of deep vein thrombosis in the lower extremity. However, portions of this examination were limited- see technologist comments above. No cystic structure found in the popliteal fossa. Left: There is no evidence of deep vein thrombosis in the lower extremity. No cystic structure found in the popliteal fossa.  *See table(s) above for measurements and observations. Electronically signed by Deitra Mayo MD on 01/28/2019 at 6:45:31 AM.    Final     Labs:  Basic Metabolic Panel: Recent Labs  Lab 02/05/19 0502  NA 138  K 4.1  CL 106  CO2 25  GLUCOSE 95  BUN 20  CREATININE 1.03*  CALCIUM 8.7*    CBC: Recent Labs  Lab 02/06/19 0229 02/07/19 0520 02/08/19 0640  WBC 11.3* 10.2 9.5   NEUTROABS  --  8.3*  --   HGB 11.2* 9.6* 9.5*  HCT 35.3* 30.9* 30.7*  MCV 87.2 87.5 88.0  PLT 874* 806* 782*    CBG: No results for input(s): GLUCAP in the last 168 hours.  Family history. Father with hypertension and coronary artery disease. Sister with hypertension and CAD. Mother with history of CVA. Brother with cancer. Denies any diabetes  Brief HPI:    Jill Castaneda is an 85 year old right handed non-English-speaking female history of CAD, left brain left thalamic infarction 2015 with residual diplopia right side weakness and tremor, hypertension, myeloproliferative neoplasm. This would granddaughter independent with assistive device. Presented 01/26/2019 generalized weakness slurred speech of acute onset. Cranial CT scan showed advanced chronic ischemic changes progression of both cerebellar hemispheres since 2015 with no acute changes. Patient did not receive TPA. MRI showed infarction posterior limb internal capsule on the right small acute infarction right parietal cortex. Echocardiogram with ejection fraction 40% normal systolic function. CT angiogram negative for large vessel occlusion. Lower extremity Dopplers negative. Neurology follow-up maintain on aspirin and Plavix 3 weeks and Plavix alone. Subcutaneous Lovenox for DVT prophylaxis was added. Cardiology service is consulted for 30 day cardiac event monitor. Dysphagia #1 nectar thick liquid diet. 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 Patient was admitted for a comprehensive rehabilitation program.  Hospital Course: Jill Castaneda was admitted to rehab 01/29/2019 for inpatient therapies to consist of PT, ST and OT at least three hours five days a week. Past admission physiatrist,  therapy team and rehab RN have worked together to provide customized collaborative inpatient rehab. Pertaining to patient right posterior limb internal capsule cardiology consulted regards to 30 day monitor as  outpatient. Patient on aspirin and Plavix for CVA prophylaxis however she developed an large abdominal wall hematoma as demonstrated on CT scan 02/06/2019. Her aspirin Plavix as well as subcutaneous Lovenox were discontinued close monitoring of hemoglobin remaining stable 9.6. Her aspirin could be resumed in 1 week and Plavix later could be resumed with no further bleeding episodes. Her diarrhea been advanced to dysphagia #2 thin liquids. She had initially been maintained on IV fluids for hydration discontinued 02/08/2019.blood pressure controlled on Norvasc. She could follow up with her primary M.D. Bouts of urinary retention felt to be related to tracking from intra-abdominal hematoma a Foley catheter tube was placed outpatient referral obtained for urology services. Follow-up hematology in regards to thrombocytosis that continued to improve. Attempts at [facility] but patient could not tolerate with abdominal pain currently maintained on agrylin 0.5 mg twice a day. She could follow-up outpatient with hematology service in regards to thrombocytosis follow-up CBC and plan on resuming Plavix and no further bleeding episodes.  Physical exam. Blood pressure 137/98 pulse 76 temperature 90.8 respirations 16 oxygen saturation 96% room air Constitutional. Alert and oriented noted language barrier granddaughter did help translating HEENT Head. Normocephalic and atraumatic Eyes. Reactive to light EOMs normal no discharge Neck was supple normal range of motion no tracheal deviation no thyromegaly Respiratory effort normal no respiratory distress no wheezes without rails GI. Positive bowel sounds nontender. Mild distention Neurological. Follow basic commands right upper extremity 4 out of 5 left upper extremity 3+ to 4 minus out of 5 right lower extremity 4 out of 5. Left lower extremity 3-4 out of 5 proximal to distal sensation intact  Rehab course: During patient's stay in rehab weekly team conferences were held to  monitor patient's progress, set goals and discuss barriers to discharge. At admission, patient required minimal cyst ambulate 30 feet rolling walker, minimal assist sit to stand. Minimal assist sit to supine. Minimal assist upper body bathing and dressing max assist lower body bathing and dressing.  He/She  has had improvement in activity tolerance, balance, postural control as well as ability to compensate for deficits. He/She has had improvement in functional use RUE/LUE  and RLE/LLE as well as improvement in awareness.patient supine to sit minimal assist ambulating 160 feet rolling walker contact-guard assist contact-guard assist for clothing management. Performed ambulatory transfers to bed with contact-guard assist. She was able to increase her ambulation up to 300 feet minimal guard for safety. Still needing some assistance for lower body ADLs. Her granddaughter remain quite supportive plan discharge to home with family after family teaching completed       Disposition:  discharged to home   Diet: dysphagia #2 thin liquids  Special Instructions: Outpatient referral obtained to urology services  Resume aspirin 81 mg daily 02/14/2019. Hold Plavix for now resume after hematoma resolves  Follow-up CBC one week with hematology service to check hemoglobin and platelet count resume Plavix if stable  Medications at discharge 1. Tylenol as needed 2. Norvasc 2.5 mg daily 3.Agrylin 0.5 mg twice a day 4. Lipitor 40 mg by mouth daily 5. Flomax 0.4 mg daily 6. Senokot-S 1 tablet by mouth twice a day 7. Protonix 40 mg daily  Discharge Instructions    Ambulatory referral to Physical Medicine Rehab   Complete by:  As directed    Moderate complexity  follow-up 1-2 weeks right PLIC  infarction   Ambulatory referral to Urology   Complete by:  As directed    Follow-up 2 weeks urinary retention after abdominal wall hematoma with Foley catheter tube in place      Follow-up Information     Kirsteins, Luanna Salk, MD Follow up.   Specialty:  Physical Medicine and Rehabilitation Why:  office to call for appointment Contact information: [place] [address] [phone]        Truitt Merle, MD Follow up.   Specialties:  Hematology, Oncology Why:  call for appointment 1-2 weeks Contact information: [place] [address] [phone]        Garvin Fila, MD Follow up.   Specialties:  Neurology, Radiology Why:  call for appointment 4 weeks Contact information: [address] [place] [address] [phone]        Robyn Haber, MD Follow up.   Specialty:  Family Medicine Contact information: [place] [address] [phone]           Signed: Lavon Paganini [place] 02/08/2019, 11:01 AM

## 2019-02-08 NOTE — Progress Notes (Signed)
Hematology follow up note   I called pt's granddaughter [name], and reviewed her chart. The episode of abdominal wall hematoma noticed, lab reviewed, she has developed anemia due to the hematoma. Aspirin and plavix has been stopped. She is doing well with rehab, and plan to go home tomorrow.   She has not tried Anagrelide yet, which I will cancel. I will set up her lab and f/u with me next week to check her CBC, and likely start her on anagrelide and restart aspirin at next visit, if she is doing well.   Questions were answered.   Truitt Merle  02/08/2019

## 2019-02-08 NOTE — Progress Notes (Signed)
[place] PHYSICAL MEDICINE & REHABILITATION PROGRESS NOTE   Subjective/Complaints: Foley in and draining well. Per grand daughter eating well but fluid intake still poor.  IVF at noc Reviewed Hgb   ROS: limited, per granddaughter who is interpreted no pain at rest in abd  Objective:   No results found. Recent Labs    02/07/19 0520 02/08/19 0640  WBC 10.2 9.5  HGB 9.6* 9.5*  HCT 30.9* 30.7*  PLT 806* 782*   No results for input(s): NA, K, CL, CO2, GLUCOSE, BUN, CREATININE, CALCIUM in the last 72 hours.  Intake/Output Summary (Last 24 hours) at 02/08/2019 0936 Last data filed at 02/08/2019 0522 Gross per 24 hour  Intake 1375 ml  Output 2100 ml  Net -725 ml     Physical Exam: Vital Signs Blood pressure 115/65, pulse 68, temperature 98 F (36.7 C), temperature source Oral, resp. rate 18, height 4\' 8"  (1.422 m), weight 45.2 kg, SpO2 92 %.  Constitutional: NAD.  Vital signs reviewed. HENT: Normocephalic.  Atraumatic. Eyes: EOMI. No discharge Cardiovascular: No JVD    Respiratory: Normal effort    GU ecchymosis and swelling of labia GI: moderate tenderness, firm mass in LUQ extending from lower costal margin to Left ASIS, to suprapubic area to midline on RIght but smaller volume today,  Neurological: Alert Motor: RLE 4+/5 proximal distal LLE 4+/5 prox to distal. Skin: Skin iswarmand dry.  Psychiatric: smiling, pleasant  Assessment/Plan: 1. Functional deficits secondary to right PLIC, caudate, parietal infarcts which require 3+ hours per day of interdisciplinary therapy in a comprehensive inpatient rehab setting.  Physiatrist is providing close team supervision and 24 hour management of active medical problems listed below.  Physiatrist and rehab team continue to assess barriers to discharge/monitor patient progress toward functional and medical goals  Care Tool:  Bathing    Body parts bathed by patient: Right arm, Left arm, Chest, Abdomen, Front perineal area,  Right upper leg, Left upper leg, Face, Right lower leg, Left lower leg, Buttocks   Body parts bathed by helper: Buttocks, Right lower leg, Left lower leg     Bathing assist Assist Level: Contact Guard/Touching assist     Upper Body Dressing/Undressing Upper body dressing Upper body dressing/undressing activity did not occur (including orthotics): Safety/medical concerns What is the patient wearing?: Button up shirt    Upper body assist Assist Level: Minimal Assistance - Patient > 75%    Lower Body Dressing/Undressing Lower body dressing      What is the patient wearing?: Incontinence brief, Pants     Lower body assist Assist for lower body dressing: Minimal Assistance - Patient > 75%     Toileting Toileting    Toileting assist Assist for toileting: Contact Guard/Touching assist     Transfers Chair/bed transfer  Transfers assist     Chair/bed transfer assist level: Contact Guard/Touching assist     Locomotion Ambulation   Ambulation assist      Assist level: Contact Guard/Touching assist Assistive device: Rollator Max distance: 300'   Walk 10 feet activity   Assist     Assist level: Contact Guard/Touching assist Assistive device: Rollator   Walk 50 feet activity   Assist    Assist level: Contact Guard/Touching assist Assistive device: Rollator    Walk 150 feet activity   Assist Walk 150 feet activity did not occur: Safety/medical concerns(decreased activity tolerance)  Assist level: Contact Guard/Touching assist Assistive device: Rollator    Walk 10 feet on uneven surface  activity   Assist  Assist level: Moderate Assistance - Patient - 50 - 74% Assistive device: Aeronautical engineer Will patient use wheelchair at discharge?: Yes Type of Wheelchair: Manual    Wheelchair assist level: Contact Guard/Touching assist Max wheelchair distance: 75    Wheelchair 50 feet with 2 turns  activity    Assist        Assist Level: Contact Guard/Touching assist   Wheelchair 150 feet activity     Assist Wheelchair 150 feet activity did not occur: Safety/medical concerns(decreased activity tolerance)        Medical Problem List and Plan: 1.Left side weakness with dysarthria/dysphagiasecondary to acute infarctinthe right posterior limb of internal capsule, caudate body,and right parietal cortex. Plan 30 day event monitor on outpatient. Aspirin Plavix 3 weeks then Plavix alone Due to hematoma holding all anticoag for 1 wk  Continue CIR PT, OT may d/c tomorrow pm if Hgb stable  2. Antithrombotics: On hold due to large abd wall hematoma -DVT/anticoagulation:Subcutaneous Lovenox- d/c -antiplatelet therapy: Aspirin 81 mg daily, Plavix 75 mg daily, d/c x 1 wk then resume ASA only x 1 wk and then add plavix if no drop in Hgb 3. Pain Management:Tylenol as needed 4. Mood:Provide emotional support, granddaughter supportive -antipsychotic agents: N/A 5. Neuropsych: This patientiscapable of making decisions on herown behalf. 6. Skin/Wound Care:Routine skin checks 7. Fluids/Electrolytes/Nutrition:    -intake reasonable, granddaughter helps   -BUN/creatinine within normal limits on 4/2 and 4/6   8. Dysphagia. Dysphagia #1 nectar liquids.  Advanced to D2 honey, continue to advance as tolerated  9. Hyperlipidemia. Lipitor 10. Thrombocytosis: Myeloproliferative neoplasm- appreciate Heme/Onc note  -platelets 886 on 4/2, 874 on 4/7, Anagrelide ordered per Heme but not in [place] is currently to follow-up outpatient for ongoing care(seen at [facility]).     -   -gdaughter pushing fluids -pt received 1/2 unit blood prior to discharge to rehab 3/31  -11. Essential hypertension. Patient on Norvasc 2.5 mg daily prior to admission.    Resumed  3/31 with improvement  Controlled on 4/7  12.  ABLA  dropped due to hemodilution no active bleeding 13.  Urinary retention, discussed with Dr Erik Obey with foley follow up at [place] [facility] in 2 wks   LOS: 10 days A FACE TO [place] 02/08/2019, 9:36 AM

## 2019-02-08 NOTE — Progress Notes (Signed)
Social Work Patient ID: Jill Castaneda, female   DOB: 01-May-1928, 85 y.o.   MRN: [id number] Met with pt and granddaughter to [place] discharge and concerns. She is feeling much better with medical concerns and feels prepared for discharge. MD feels can go home tomorrow if labs are stable. Will not know until later in the afternoon and granddaughter would like for her to have therapy tomorrow. Feels can do her PT and has Arts development officer who she can consult with for questions regarding the foley. She will receive education from nursing here prior to discharge. Has all equipment already. Work toward discharge later tomorrow afternoon

## 2019-02-08 NOTE — Progress Notes (Signed)
Spoke with Linna Hoff A PA regarding concerns of pt and pt's granddaughter. Pt and granddaughter advised to use incentive spirometer aggressively every hour while awake and to do foot pumps aggressively every hour while awake.

## 2019-02-08 NOTE — Progress Notes (Signed)
Physical Therapy Session Note  Patient Details  Name: Jill Castaneda MRN: [id number] Date of Birth: Oct 21, 1928  Today's Date: 02/08/2019 PT Individual Time: 0800-0900 PT Individual Time Calculation (min): 60 min   Short Term Goals: Week 1:  PT Short Term Goal 1 (Week 1): Patient will perform sitting balance without UE support for >2 minutes while maintaining midline without cuing. PT Short Term Goal 2 (Week 1): Patient will perform transfers from various household surfaces and car transfers with CGA for safety and balance. PT Short Term Goal 3 (Week 1): Patient will ambulate >74' with CGA for safety using LRAD. PT Short Term Goal 4 (Week 1): Patient will go up and down 4 steps using 1 rail and/or LRAD with CGA for safety and balance.  Skilled Therapeutic Interventions/Progress Updates:    Patient received in bed with grand-daughter present, pleasant and willing to participate in PT session. Able to complete bed mobility with MinA, then used 2 four-inch ace wraps with approximately 40-50% stretch for gentle support of painful abdominal site, patient reports improved pain in this area with mobility with ace wraps in place. Grand-daughter educated on benefit of ace wrap support, technique, and amount of stretch applied. Able to complete functional transfers and gait approximately 324f with rollator and min guard today. Focused majority of session on balance activities including tandem stance, cone taps in ipsilateral and cross midline directions and with multiple height cones, and dynamic challenge of kicking rolling ball with both LEs. Noted increased difficulty with stance phase activities with L LE. Then able to gait train 3063fwith rollator and min guard, left in care of grand-daughter in room with all needs otherwise met this morning.   Therapy Documentation Precautions:  Precautions Precautions: Fall Restrictions Weight Bearing Restrictions: No General:   Vital Signs:   Pain: Pain  Assessment Pain Scale: 0-10 Pain Score: 4  Pain Type: Chronic pain Pain Location: Abdomen Pain Orientation: Right Pain Descriptors / Indicators: Discomfort Pain Onset: With Activity Patients Stated Pain Goal: 0 Pain Intervention(s): Other (Comment)(applied ace wrap for support to abdomen )    Therapy/Group: Individual Therapy   KrDeniece ReeT, DPT, CBIS  Supplemental Physical Therapist [facility]  Pager [phone] [phone]  02/08/2019, 12:43 PM

## 2019-02-08 NOTE — Progress Notes (Signed)
Speech Language Pathology Discharge Summary  Patient Details  Name: Jill Castaneda MRN: [id number] Date of Birth: [birth year]  Today's Date: 02/08/2019 SLP Individual Time: 1030-1100 SLP Individual Time Calculation (min): 30 min   Skilled Therapeutic Interventions:  Skilled treatment session focused on completing education with pt and her grand-daughter. All questions answered and all information given. No further services are indicated by ST.     Patient has met 4 of 4 long term goals.  Patient to discharge at overall Supervision level.    Clinical Impression/Discharge Summary:    Pt has made good progress in skilled ST sessions and as a result she has met all of her LTGs. Further MBS revealed safety when consuming thin liquids. All education has been completed. No further services are indicated.   Care Partner:  Caregiver Able to Provide Assistance: Yes  Type of Caregiver Assistance: Physical  Recommendation:  24 hour supervision/assistance        Reasons for discharge: Treatment goals met;Discharged from hospital   Patient/Family Agrees with Progress Made and Goals Achieved: Yes    Majd Tissue 02/08/2019, 11:05 AM

## 2019-02-08 NOTE — Progress Notes (Signed)
Occupational Therapy Session Note  Patient Details  Name: Jill Castaneda MRN: [id number] Date of Birth: 1928/03/06  Today's Date: 02/08/2019 OT Individual Time: 0930-1030 OT Individual Time Calculation (min): 60 min    Short Term Goals: Week 1:  OT Short Term Goal 1 (Week 1): STGs=LTGs secondary to estimated short LOS     Skilled Therapeutic Interventions/Progress Updates:    Pt received in bed with her granddaughter by her side. Pt did not have pain unless she moved.  Recommended pt hold pillow against her torso for gentle pressure as pt is getting up from bed with A . Pt did fairly well with this. Pt dressed with min A for buttons and to manage catheter bag.  Worked on standing at sink to use tooth swab.  Sit to stand with S to rollator.   From w/c, Pt worked on a [employer] with mod cues to find the correct pieces but then she could assemble them welll. Good use of LUE and attention to L side today.  SLP therapist arrived for her next session.    Therapy Documentation Precautions:  Precautions Precautions: Fall Restrictions Weight Bearing Restrictions: No     Pain: Pain Assessment Pain Scale: 0-10 Pain Score: 0-No pain   Therapy/Group: Individual Therapy  [place] 02/08/2019, 12:31 PM

## 2019-02-09 ENCOUNTER — Inpatient Hospital Stay (HOSPITAL_COMMUNITY): Payer: Self-pay | Admitting: Occupational Therapy

## 2019-02-09 ENCOUNTER — Inpatient Hospital Stay (HOSPITAL_COMMUNITY): Payer: Self-pay

## 2019-02-09 ENCOUNTER — Inpatient Hospital Stay (HOSPITAL_COMMUNITY): Payer: Self-pay | Admitting: Physical Therapy

## 2019-02-09 DIAGNOSIS — I952 Hypotension due to drugs: Secondary | ICD-10-CM

## 2019-02-09 LAB — CBC
HCT: 30.9 % — ABNORMAL LOW (ref 36.0–46.0)
Hemoglobin: 9.9 g/dL — ABNORMAL LOW (ref 12.0–15.0)
MCH: 28.1 pg (ref 26.0–34.0)
MCHC: 32 g/dL (ref 30.0–36.0)
MCV: 87.8 fL (ref 80.0–100.0)
Platelets: 871 10*3/uL — ABNORMAL HIGH (ref 150–400)
RBC: 3.52 MIL/uL — ABNORMAL LOW (ref 3.87–5.11)
RDW: 15.1 % (ref 11.5–15.5)
WBC: 10.5 10*3/uL (ref 4.0–10.5)
nRBC: 0 % (ref 0.0–0.2)

## 2019-02-09 NOTE — Progress Notes (Signed)
Physical Therapy Session Note  Patient Details  Name: Jill Castaneda MRN: [id number] Date of Birth: 11/27/1927  Today's Date: 02/09/2019 PT Individual Time: 0845-0920 PT Individual Time Calculation (min): 35 min   Short Term Goals: Week 1:  PT Short Term Goal 1 (Week 1): Patient will perform sitting balance without UE support for >2 minutes while maintaining midline without cuing. PT Short Term Goal 2 (Week 1): Patient will perform transfers from various household surfaces and car transfers with CGA for safety and balance. PT Short Term Goal 3 (Week 1): Patient will ambulate >22' with CGA for safety using LRAD. PT Short Term Goal 4 (Week 1): Patient will go up and down 4 steps using 1 rail and/or LRAD with CGA for safety and balance.  Skilled Therapeutic Interventions/Progress Updates:     Patient in bed upon PT arrival. Patient alert and agreeable to PT session. Patient reported feeling very fatigued from bathing, dressing, and eating breakfast prior to PT session. Her BP was low and orthostatic limiting mobility during session, RN made aware.  Therapeutic Exercise: Patient performed the following exercises with verbal and tactile cues for proper technique. Performed bed level and sitting exercises to increased BP in hopes to progress with increased functional mobility. -B SLR x10 in supine -R leg press in supine with manual resistance provided by PT x10 -L leg press without resistance due to abdominal pain with resistance x10 -Marching in sitting x2, terminated due to abdominal pain -B LAQ's in sitting x15   Therapeutic Activity: Bed Mobility: Patient performed supine to/from sit with min A from her granddaughter using good body mechanics and without requiring any cues from PT for technique. Assist provided to reduce pain in her abdomen.  Transfers: Patient performed sit to/from stand x2 using a rollator with supervision for safety. Patient's granddaughter provided verbal cues for  locking breaks prior to standing. Patient's BP dropped after second stand, see vitals below, and she was assisted to supine with the bed flat and B LE elevated, BP returned [facility].   Patient missed 25 minutes of skilled PT due to patient fatigue and low BP. Patient in bed at end of session with breaks locked, her granddaughter at bedside, and all needs within reach.   Therapy Documentation Precautions:  Precautions Precautions: Fall Restrictions Weight Bearing Restrictions: No General: PT Amount of Missed Time (min): 25 Minutes PT Missed Treatment Reason: Patient fatigue;Other (Comment)(low BP) Vital Signs: BP supine: 96/63 mmHg BP sitting: 98/65 mmHg BP standing first trial: 84/55 mmhg BP standing second trial: 77/50 mmHg BP supine with LE elevated: 104/66 mmHg Pain: Pain Assessment Pain Scale: 0-10 Pain Score: 0-No pain    Therapy/Group: Individual Therapy  Doreene Burke, PT ,DPT 02/09/2019, 1:07 PM

## 2019-02-09 NOTE — Progress Notes (Signed)
Physical Therapy Discharge Summary  Patient Details  Name: Jill Castaneda MRN: [id number] Date of Birth: [birth year]  Today's Date: 02/09/2019 PT Individual Time:1500-1600   Treatment Time. 60 min    Patient has met 10 of 11 long term goals due to improved activity tolerance, improved balance, improved postural control, increased strength, increased range of motion, ability to compensate for deficits, functional use of  left upper extremity and left lower extremity and improved coordination.  Patient to discharge at an ambulatory level Supervision.   Patient's care partner is independent to provide the necessary physical assistance at discharge.  Reasons goals not met: CGA for car transfer for safety.   Recommendation:  Patient will benefit from ongoing skilled PT services in home health setting to continue to advance safe functional mobility, address ongoing impairments in balance, strength, safety, tranfers, coordination, and minimize fall risk.  Equipment: rollator  Reasons for discharge: treatment goals met and discharge from hospital  Patient/family agrees with progress made and goals achieved: Yes    PT treatment  Pt received sitting in WC and agreeable to PT. PT instructed pt in Grad day assessment to measure progress toward goals. See below for details. Gait training with Rollator 2 x 173f with supervision assist. Basic and car transfer training throughout treatment. Supervision assist for basic transfers with rollator and CGA for car transfer with Rollator. Pt finished treatment by being transported to car at hospital entrance for d/c as listed with granddaughter.    PT Discharge Precautions/Restrictions Precautions Precautions: Fall Vital Signs Therapy Vitals Temp: 98.2 F (36.8 C) Temp Source: Oral Pulse Rate: 74 Resp: 16 BP: 112/69 Patient Position (if appropriate): Lying Oxygen Therapy SpO2: 95 % O2 Device: Room Air Pain   0/10  Vision/Perception   Perception Perception: Impaired(Lt inattention ) Praxis Praxis: Intact  Cognition Overall Cognitive Status: Within Functional Limits for tasks assessed Arousal/Alertness: Awake/alert Orientation Level: Oriented X4 Sustained Attention: Appears intact Sensation Sensation Light Touch: Appears Intact Coordination Gross Motor Movements are Fluid and Coordinated: No Fine Motor Movements are Fluid and Coordinated: No Coordination and Movement Description: Affected by tremulous R UE  Motor  Motor Motor: Hemiplegia  Mobility Bed Mobility Rolling Right: Supervision/verbal cueing Rolling Left: Supervision/Verbal cueing Supine to Sit: Supervision/Verbal cueing Transfers Sit to Stand: Supervision/Verbal cueing Stand to Sit: Supervision/Verbal cueing Stand Pivot Transfers: Supervision/Verbal cueing Transfer (Assistive device): Rollator  Locomotion  Gait Gait Assistance: Supervision/Verbal cueing Gait Distance (Feet): 150 Feet Assistive device: Rollator Gait Gait: Yes Gait Pattern: Impaired Gait Pattern: Decreased step length - left Stairs / Additional Locomotion Stairs: Yes Stairs Assistance: Minimal Assistance - Patient > 75% Stair Management Technique: One rail Left Number of Stairs: 8 Height of Stairs: 6 Wheelchair Mobility Wheelchair Mobility: No  Trunk/Postural Assessment  Cervical Assessment Cervical Assessment: Exceptions to WFL(forward head) Thoracic Assessment Thoracic Assessment: Exceptions to WFL(kyphotic) Lumbar Assessment Lumbar Assessment: Exceptions to WFL(posterior pelvic tilt)  Balance Balance Balance Assessed: Yes Dynamic Sitting Balance Dynamic Sitting - Balance Support: During functional activity;Feet supported Dynamic Sitting - Level of Assistance: 5: Stand by assistance Dynamic Standing Balance Dynamic Standing - Balance Support: No upper extremity supported;Right upper extremity supported;During functional activity Dynamic Standing - Level of  Assistance: 5: Stand by assistance Dynamic Standing - Comments: toileting Extremity Assessment  RUE Assessment RUE Assessment: Exceptions to WFL(Tremulous, needs assist from Lt to use functionally ) LUE Assessment LUE Assessment: Exceptions to [facility] Strength Comments: 3-/5 proximal to distal, inattention noted RLE Assessment RLE Assessment: Within Functional Limits Active Range of  Motion (AROM) Comments: WFL for all functional mobility General Strength Comments: grossly 4+/5 to 5/5 proximal to distal  LLE Assessment LLE Assessment: Within Functional Limits Active Range of Motion (AROM) Comments: WFL for all functional mobility General Strength Comments: grossly 4+/5 to 5/5 proximal to distal     Lorie Phenix 02/09/2019, 3:22 PM

## 2019-02-09 NOTE — Progress Notes (Signed)
Occupational Therapy Discharge Summary  Patient Details  Name: Jill Castaneda MRN: [id number] Date of Birth: Jan 24, 1928  Today's Date: 02/09/2019 OT Individual Time:  - 1638-4665 Individual Treatment Time Calculation: 57 min    Patient has met 10 of 11 long term goals due to improved activity tolerance, improved balance, postural control, ability to compensate for deficits, functional use of  LEFT upper extremity, improved attention and improved coordination.  Patient to discharge at overall Supervision level.  Patient's caregiver [name] has been present during all OT sessions for open d/c collaboration and hands on family education. Pt to d/c at overall supervision-Min A level with Ana verbalizing and exhibiting the ability to provide this care at home.    Pt still requires Min A during LB dressing, therefore goal was unable to be met. Ana verbalizes being able to provide this assist at d/c    Recommendation:  Patient will benefit from ongoing skilled OT services in home health setting to continue to advance functional skills in the area of BADL and iADL.  Equipment: No equipment provided. Caregiver does not want a TTB for tub shower, though this was recommended for safest shower transfer vs shower chair use.   Reasons for discharge: treatment goals met and discharge from hospital  Patient/family agrees with progress made and goals achieved: Yes   Skilled Therapeutic Intervention:  Pt greeted in bed with caregiver [name] present. Reported fatigue from this AM, and per [facility], pt with a bout of hypotension during PT session. Consulted with MD, who cleared therapy participation if systolic BP >99. Orthostatics taken during session and results recorded below. Pt was motivated to walk, but reported feeling too fatigued to attempt tub shower transfer using shower chair per home setup. She ambulated ~625 ft in hallway with Ana providing vcs for straight path due to Lt veering when using rollator. Discussed  IADL participation at home, and issued pt with walker bag for increasing ease of item transport. Encouraged continued engagement in meaningful tasks, such as crocheting, for promoting psychosocial wellness while working on bilateral hand coordination. When pt returned to room, she requested to drink some water. Provided her with weighted mug, as she exhibited moderate spillage when using foam cup. Educated Ana to have pt use both hands for stability and bilateral integration. Also discussed BP mgt strategies at home, such as trendelenberg position, compression garments, and drinking ample fluids. Ana reports she will fill weighted mug x2 during the day with water for pt to drink. Ana had no further d/c questions for OT, grateful of education she has received from team. Pt left with Ana, resting comfortably in bed at session exit.   BP while supine: 122/70 BP after 2 minutes EOB: 114/81 BP after 1 minute in standing: 109/71 After ambulating 600+ ft, sitting on rollator seat: 103/72 Pt asymptomatic during all reads, and throughout tx   OT Discharge Precautions/Restrictions  Precautions Precautions: Fall Restrictions Weight Bearing Restrictions: No General PT Missed Treatment Reason: Patient fatigue;Other (Comment)(low BP) Pain: No c/o pain during tx  Pain Assessment Pain Scale: 0-10 Pain Score: 0-No pain ADL ADL Grooming: Setup Upper Body Bathing: Supervision/safety Where Assessed-Upper Body Bathing: Shower Lower Body Bathing: Supervision/safety Where Assessed-Lower Body Bathing: Shower Upper Body Dressing: Supervision/safety Lower Body Dressing: Minimal assistance Toileting: Supervision/safety Where Assessed-Toileting: Glass blower/designer: Close supervision Toilet Transfer Method: Ambulating(rollator) Social research officer, government: Close supervision(per caregiver report) Social research officer, government Method: Ambulating(rollator) Youth worker: Radio broadcast assistant Vision Patient  Visual Report: Diplopia;No change from baseline Perception  Perception: Impaired(Lt inattention ) Praxis Praxis: Intact Cognition Overall Cognitive Status: Within Functional Limits for tasks assessed Arousal/Alertness: Awake/alert Orientation Level: Oriented X4 Sustained Attention: Appears intact Sensation Sensation Light Touch: Appears Intact Coordination Gross Motor Movements are Fluid and Coordinated: No Fine Motor Movements are Fluid and Coordinated: No Coordination and Movement Description: Affected by tremulous R UE  Motor  Motor Motor: Hemiplegia Mobility    Close supervision ambulatory bathroom transfers using rollator  Trunk/Postural Assessment  Cervical Assessment Cervical Assessment: Exceptions to WFL(forward head) Thoracic Assessment Thoracic Assessment: Exceptions to WFL(kyphotic) Lumbar Assessment Lumbar Assessment: Exceptions to WFL(posterior pelvic tilt)  Balance Balance Balance Assessed: Yes Dynamic Sitting Balance Dynamic Sitting - Balance Support: During functional activity;Feet supported Dynamic Sitting - Level of Assistance: 5: Stand by assistance Dynamic Standing Balance Dynamic Standing - Balance Support: No upper extremity supported;Right upper extremity supported;During functional activity Dynamic Standing - Level of Assistance: 5: Stand by assistance Dynamic Standing - Comments: toileting Extremity/Trunk Assessment RUE Assessment RUE Assessment: Exceptions to WFL(Tremulous, needs assist from Lt to use functionally ) LUE Assessment LUE Assessment: Exceptions to [facility] General Strength Comments: 3-/5 proximal to distal, inattention noted   Jill Castaneda [place] 02/09/2019, 12:02 PM

## 2019-02-09 NOTE — Discharge Instructions (Signed)
Inpatient Rehab Discharge Instructions  Jill Castaneda Discharge date and time: 02/09/19   Activities/Precautions/ Functional Status: Activity: activity as tolerated Diet: Soft/chopped foods. Limit distractions Wound Care: none needed    Functional status:  ___ No restrictions     ___ Walk up steps independently _X__ 24/7 supervision/assistance   ___ Walk up steps with assistance ___ Intermittent supervision/assistance  ___ Bathe/dress independently ___ Walk with walker     _X__ Bathe/dress with assistance ___ Walk Independently    ___ Shower independently ___ Walk with assistance    ___ Shower with assistance _X__ No alcohol     ___ Return to work/school ________   STROKE/TIA DISCHARGE INSTRUCTIONS SMOKING Cigarette smoking nearly doubles your risk of having a stroke & is the single most alterable risk factor  If you smoke or have smoked in the last 12 months, you are advised to quit smoking for your health.  Most of the excess cardiovascular risk related to smoking disappears within a year of stopping.  Ask you doctor about anti-smoking medications  [place] Quit Line: 1-800-QUIT NOW  Free Smoking Cessation Classes [phone]  CHOLESTEROL Know your levels; limit fat & cholesterol in your diet  Lipid Panel     Component Value Date/Time   CHOL 234 (H) 01/25/2019 2255   TRIG 88 01/25/2019 2255   HDL 62 01/25/2019 2255   CHOLHDL 3.8 01/25/2019 2255   VLDL 18 01/25/2019 2255   LDLCALC 154 (H) 01/25/2019 2255      Many patients benefit from treatment even if their cholesterol is at goal.  Goal: Total Cholesterol (CHOL) less than 160  Goal:  Triglycerides (TRIG) less than 150  Goal:  HDL greater than 40  Goal:  LDL (LDLCALC) less than 100   BLOOD PRESSURE American Stroke Association blood pressure target is less that 120/80 mm/Hg  Your discharge blood pressure is:  BP: (!) 134/98  Monitor your blood pressure  Limit your salt and alcohol intake  Many individuals will  require more than one medication for high blood pressure  DIABETES (A1c is a blood sugar average for last 3 months) Goal HGBA1c is under 7% (HBGA1c is blood sugar average for last 3 months)  Diabetes: No known diagnosis of diabetes    Lab Results  Component Value Date   HGBA1C 5.8 (H) 01/25/2019     Your HGBA1c can be lowered with medications, healthy diet, and exercise.  Check your blood sugar as directed by your physician  Call your physician if you experience unexplained or low blood sugars.  PHYSICAL ACTIVITY/REHABILITATION Goal is 30 minutes at least 4 days per week  Activity: Increase activity slowly, Therapies: Physical Therapy: Home Health Return to work: N/A  Activity decreases your risk of heart attack and stroke and makes your heart stronger.  It helps control your weight and blood pressure; helps you relax and can improve your mood.  Participate in a regular exercise program.  Talk with your doctor about the best form of exercise for you (dancing, walking, swimming, cycling).  DIET/WEIGHT Goal is to maintain a healthy weight  Your discharge diet is:  Diet Order            DIET - DYS 1 Room service appropriate? Yes with Assist; Fluid consistency: Nectar Thick  Diet effective now              liquids Your height is:    Your current weight is:   Your Body Mass Index (BMI) is:  Following the type of diet specifically designed for you will help prevent another stroke.  Your goal weight range is:    Your goal Body Mass Index (BMI) is 19-24.  Healthy food habits can help reduce 3 risk factors for stroke:  High cholesterol, hypertension, and excess weight.  RESOURCES Stroke/Support Group:  Call [phone]   STROKE EDUCATION PROVIDED/REVIEWED AND GIVEN TO PATIENT Stroke warning signs and symptoms How to activate emergency medical system (call 911). Medications prescribed at discharge. Need for follow-up after discharge. Personal risk factors for  stroke. Pneumonia vaccine given:  Flu vaccine given:  My questions have been answered, the writing is legible, and I understand these instructions.  I will adhere to these goals & educational materials that have been provided to me after my discharge from the hospital.   Special Instructions: 1.  Follow up CBC one week with Dr Burr Medico Hematology check Hemoglobin and platelet. She will advise you on how to resume blood thinners.     COMMUNITY REFERRALS UPON DISCHARGE:   None:  Granddaughter who is a PT will do her home health therapies. Has home exercise program also. Grandaughter feels can consult nursing colleagues if questions.  Medical Equipment/Items Ordered:Granddaughter ordered rollator for home, has all other needed equipment      My questions have been answered and I understand these instructions. I will adhere to these goals and the provided educational materials after my discharge from the hospital.  Patient/Caregiver Signature _______________________________ Date __________  Clinician Signature _______________________________________ Date __________  Please bring this form and your medication list with you to all your follow-up doctor's appointments.

## 2019-02-09 NOTE — Progress Notes (Signed)
Social Work  Discharge Note  The overall goal for the admission was met for:   Discharge location: Yes-HOME WITH GRANDDAUGHTER AND GREAT GRANDDAUGHTER-24 HR CARE  Length of Stay: Yes-11 DAYS  Discharge activity level: Yes-SUPERVISION-CGA LEVEL  Home/community participation: Yes  Services provided included: MD, RD, PT, OT, SLP, RN, CM, Pharmacy and SW  Financial Services: Other: [employer] MEDICA[employer]  Follow-up services arranged: Other: GRANDDAUGHTER IS A PT AND WORKS THE WEEKENDS. HAS RN FRIENDS WHO WILL ANSWER HER QUESTIONS AND ASSIST  Comments (or additional information):GRANDDAUGHTER STAYED HERE TO INTERPRET AND ASSIST WITH CARE- SHE IS A HHPT AND WILL PROVIDE THE CARE SHE NEEDS AT HOME, ALONG WITH HER DAUGHTER-PT'S GREAT GRANDDAUGHTER. TO FOLOW UP WITH PCP IN ONE WEEK  Patient/Family verbalized understanding of follow-up arrangements: Yes  Individual responsible for coordination of the follow-up plan: ANA-GRANDDAUGHTER  Confirmed correct DME delivered: ,  G 02/09/2019    ,  G 

## 2019-02-09 NOTE — Progress Notes (Signed)
Patient was discharged from the unit following therapy. Patient left the facility with her granddaughter and all personal belongings. Granddaughter and patient were educated on foley care and all questions were answered. Doy Hutching, LPN

## 2019-02-09 NOTE — Progress Notes (Signed)
[place] PHYSICAL MEDICINE & REHABILITATION PROGRESS NOTE   Subjective/Complaints:  Low BP this am, discussed bloodwork  ROS: limited, per granddaughter who is interpreted no pain at rest in abd  Objective:   No results found. Recent Labs    02/08/19 0640 02/09/19 0453  WBC 9.5 10.5  HGB 9.5* 9.9*  HCT 30.7* 30.9*  PLT 782* 871*   No results for input(s): NA, K, CL, CO2, GLUCOSE, BUN, CREATININE, CALCIUM in the last 72 hours.  Intake/Output Summary (Last 24 hours) at 02/09/2019 0958 Last data filed at 02/09/2019 0700 Gross per 24 hour  Intake 240 ml  Output 1450 ml  Net -1210 ml     Physical Exam: Vital Signs Blood pressure 124/71, pulse 65, temperature 97.7 F (36.5 C), resp. rate 14, height 4\' 8"  (1.422 m), weight 45.2 kg, SpO2 93 %.  Constitutional: NAD.  Vital signs reviewed. HENT: Normocephalic.  Atraumatic. Eyes: EOMI. No discharge Cardiovascular: No JVD    Respiratory: Normal effort    GU ecchymosis and swelling of labia GI: moderate tenderness, firm mass in LUQ extending from lower costal margin to Left ASIS, to suprapubic area to midline on RIght but smaller volume today,  Neurological: Alert Motor: RLE 4+/5 proximal distal LLE 4+/5 prox to distal. Skin: Skin iswarmand dry.  Psychiatric: smiling, pleasant  Assessment/Plan: 1. Functional deficits secondary to right PLIC, caudate, parietal infarcts Stable for D/C today F/u PCP in 3-4 weeks F/u PM&R 4 weeks See D/C summary See D/C instructions Care Tool:  Bathing    Body parts bathed by patient: Right arm, Left arm, Chest, Abdomen, Front perineal area, Right upper leg, Left upper leg, Face, Right lower leg, Left lower leg, Buttocks   Body parts bathed by helper: Buttocks, Right lower leg, Left lower leg     Bathing assist Assist Level: Contact Guard/Touching assist     Upper Body Dressing/Undressing Upper body dressing Upper body dressing/undressing activity did not occur (including  orthotics): Safety/medical concerns What is the patient wearing?: Button up shirt    Upper body assist Assist Level: Minimal Assistance - Patient > 75%    Lower Body Dressing/Undressing Lower body dressing      What is the patient wearing?: Incontinence brief, Pants     Lower body assist Assist for lower body dressing: Minimal Assistance - Patient > 75%     Toileting Toileting    Toileting assist Assist for toileting: Contact Guard/Touching assist     Transfers Chair/bed transfer  Transfers assist     Chair/bed transfer assist level: Supervision/Verbal cueing     Locomotion Ambulation   Ambulation assist      Assist level: Supervision/Verbal cueing Assistive device: Rollator Max distance: 300'   Walk 10 feet activity   Assist     Assist level: Supervision/Verbal cueing Assistive device: Rollator   Walk 50 feet activity   Assist    Assist level: Supervision/Verbal cueing Assistive device: Rollator    Walk 150 feet activity   Assist Walk 150 feet activity did not occur: Safety/medical concerns(decreased activity tolerance)  Assist level: Supervision/Verbal cueing Assistive device: Rollator    Walk 10 feet on uneven surface  activity   Assist     Assist level: Moderate Assistance - Patient - 50 - 74% Assistive device: Aeronautical engineer Will patient use wheelchair at discharge?: Yes Type of Wheelchair: Manual    Wheelchair assist level: Contact Guard/Touching assist Max wheelchair distance: 75    Wheelchair 50 feet  with 2 turns activity    Assist        Assist Level: Contact Guard/Touching assist   Wheelchair 150 feet activity     Assist Wheelchair 150 feet activity did not occur: Safety/medical concerns(decreased activity tolerance)        Medical Problem List and Plan: 1.Left side weakness with dysarthria/dysphagiasecondary to acute infarctinthe right posterior limb of internal  capsule, caudate body,and right parietal cortex. Plan 30 day event monitor on outpatient. Aspirin Plavix 3 weeks then Plavix alone Due to hematoma holding all anticoag for 1 wk  Continue CIR PT, OT may d/c today will stop flomax  2. Antithrombotics: On hold due to large abd wall hematoma- the hematoma is getting softer and has not expanded -DVT/anticoagulation:Subcutaneous Lovenox- d/c -antiplatelet therapy: Aspirin 81 mg daily, Plavix 75 mg daily, d/c x 1 wk then resume ASA only x 1 wk and then add plavix if no drop in Hgb 3. Pain Management:Tylenol as needed 4. Mood:Provide emotional support, granddaughter supportive -antipsychotic agents: N/A 5. Neuropsych: This patientiscapable of making decisions on herown behalf. 6. Skin/Wound Care:Routine skin checks 7. Fluids/Electrolytes/Nutrition:    -intake reasonable, granddaughter helps   -BUN/creatinine within normal limits on 4/2 and 4/6   8. Dysphagia. Dysphagia #1 nectar liquids.  Advanced to D2 honey, continue to advance as tolerated  9. Hyperlipidemia. Lipitor 10. Thrombocytosis: Myeloproliferative neoplasm- appreciate Heme/Onc note  -platelets 886 on 4/2, 874 on 4/7, Anagrelide ordered per Heme but not in [place] is currently to follow-up outpatient for ongoing care(seen at [facility]).     -   -gdaughter pushing fluids -pt received 1/2 unit blood prior to discharge to rehab 3/31  -11. Essential hypertension. Patient on Norvasc 2.5 mg daily prior to admission.    Resumed  3/31 with improvement  Controlled on 4/7  12.  ABLA Hgb now improving 13.  Urinary retention, discussed with Dr Erik Obey with foley follow up at [place] [facility] in 2 wks stop flomax due to orthostatic hypotension  LOS: 11 days A FACE TO Henry E  02/09/2019, 9:58 AM

## 2019-02-14 ENCOUNTER — Telehealth: Payer: Self-pay

## 2019-02-14 ENCOUNTER — Other Ambulatory Visit: Payer: Self-pay | Admitting: Hematology

## 2019-02-14 MED ORDER — ANAGRELIDE HCL 0.5 MG PO CAPS: 0.5000 mg | ORAL_CAPSULE | Freq: Two times a day (BID) | ORAL | 2 refills | Status: DC

## 2019-02-14 NOTE — Telephone Encounter (Signed)
Called and left Granddaughter [name] a message to call back

## 2019-02-15 ENCOUNTER — Telehealth: Payer: Self-pay | Admitting: Hematology

## 2019-02-15 ENCOUNTER — Encounter: Payer: Self-pay | Admitting: Hematology

## 2019-02-15 ENCOUNTER — Telehealth: Payer: Self-pay

## 2019-02-15 ENCOUNTER — Inpatient Hospital Stay: Payer: Self-pay

## 2019-02-15 ENCOUNTER — Inpatient Hospital Stay: Payer: Self-pay | Attending: Hematology | Admitting: Hematology

## 2019-02-15 ENCOUNTER — Other Ambulatory Visit: Payer: Self-pay

## 2019-02-15 VITALS — BP 110/67 | HR 76 | Temp 98.1°F | Resp 17 | Ht <= 58 in

## 2019-02-15 DIAGNOSIS — I10 Essential (primary) hypertension: Secondary | ICD-10-CM | POA: Insufficient documentation

## 2019-02-15 DIAGNOSIS — D471 Chronic myeloproliferative disease: Secondary | ICD-10-CM

## 2019-02-15 DIAGNOSIS — R05 Cough: Secondary | ICD-10-CM | POA: Insufficient documentation

## 2019-02-15 DIAGNOSIS — R7989 Other specified abnormal findings of blood chemistry: Secondary | ICD-10-CM | POA: Insufficient documentation

## 2019-02-15 DIAGNOSIS — Z7902 Long term (current) use of antithrombotics/antiplatelets: Secondary | ICD-10-CM | POA: Insufficient documentation

## 2019-02-15 DIAGNOSIS — D75839 Thrombocytosis, unspecified: Secondary | ICD-10-CM

## 2019-02-15 DIAGNOSIS — R1032 Left lower quadrant pain: Secondary | ICD-10-CM | POA: Insufficient documentation

## 2019-02-15 DIAGNOSIS — Z8673 Personal history of transient ischemic attack (TIA), and cerebral infarction without residual deficits: Secondary | ICD-10-CM | POA: Insufficient documentation

## 2019-02-15 DIAGNOSIS — D473 Essential (hemorrhagic) thrombocythemia: Secondary | ICD-10-CM

## 2019-02-15 DIAGNOSIS — E785 Hyperlipidemia, unspecified: Secondary | ICD-10-CM | POA: Insufficient documentation

## 2019-02-15 DIAGNOSIS — Z79899 Other long term (current) drug therapy: Secondary | ICD-10-CM | POA: Insufficient documentation

## 2019-02-15 DIAGNOSIS — D751 Secondary polycythemia: Secondary | ICD-10-CM | POA: Insufficient documentation

## 2019-02-15 DIAGNOSIS — Z86711 Personal history of pulmonary embolism: Secondary | ICD-10-CM | POA: Insufficient documentation

## 2019-02-15 LAB — CMP (CANCER CENTER ONLY)
ALT: 10 U/L (ref 0–44)
AST: 21 U/L (ref 15–41)
Albumin: 3.1 g/dL — ABNORMAL LOW (ref 3.5–5.0)
Alkaline Phosphatase: 56 U/L (ref 38–126)
Anion gap: 9 (ref 5–15)
BUN: 15 mg/dL (ref 8–23)
CO2: 24 mmol/L (ref 22–32)
Calcium: 8.6 mg/dL — ABNORMAL LOW (ref 8.9–10.3)
Chloride: 106 mmol/L (ref 98–111)
Creatinine: 1.03 mg/dL — ABNORMAL HIGH (ref 0.44–1.00)
GFR, Est AFR Am: 56 mL/min — ABNORMAL LOW (ref >60)
GFR, Estimated: 48 mL/min — ABNORMAL LOW (ref >60)
Glucose, Bld: 120 mg/dL — ABNORMAL HIGH (ref 70–99)
Potassium: 4.2 mmol/L (ref 3.5–5.1)
Sodium: 139 mmol/L (ref 135–145)
Total Bilirubin: 1.1 mg/dL (ref 0.3–1.2)
Total Protein: 6.6 g/dL (ref 6.5–8.1)

## 2019-02-15 LAB — CBC WITH DIFFERENTIAL (CANCER CENTER ONLY)
Abs Immature Granulocytes: 0.09 10*3/uL — ABNORMAL HIGH (ref 0.00–0.07)
Basophils Absolute: 0.2 10*3/uL — ABNORMAL HIGH (ref 0.0–0.1)
Basophils Relative: 2 %
Eosinophils Absolute: 0.5 10*3/uL (ref 0.0–0.5)
Eosinophils Relative: 4 %
HCT: 39.7 % (ref 36.0–46.0)
Hemoglobin: 12.2 g/dL (ref 12.0–15.0)
Immature Granulocytes: 1 %
Lymphocytes Relative: 7 %
Lymphs Abs: 0.8 10*3/uL (ref 0.7–4.0)
MCH: 28.4 pg (ref 26.0–34.0)
MCHC: 30.7 g/dL (ref 30.0–36.0)
MCV: 92.5 fL (ref 80.0–100.0)
Monocytes Absolute: 0.6 10*3/uL (ref 0.1–1.0)
Monocytes Relative: 6 %
Neutro Abs: 8.4 10*3/uL — ABNORMAL HIGH (ref 1.7–7.7)
Neutrophils Relative %: 80 %
Platelet Count: 1106 10*3/uL — ABNORMAL HIGH (ref 150–400)
RBC: 4.29 MIL/uL (ref 3.87–5.11)
RDW: 16.2 % — ABNORMAL HIGH (ref 11.5–15.5)
WBC Count: 10.5 10*3/uL (ref 4.0–10.5)
nRBC: 0 % (ref 0.0–0.2)

## 2019-02-15 NOTE — Telephone Encounter (Signed)
-----   Message from Truitt Merle, MD sent at 02/15/2019  1:43 PM EDT ----- OK, will hold on plavix for now, you may re-evaluate when you see her in your office, thanks much  Jill Castaneda, please let pt's granddaughter know this, thanks   Jill Castaneda  ----- Message ----- From: Garvin Fila, MD Sent: 02/15/2019  12:49 PM EDT To: Truitt Merle, MD  I am okay with just continuing aspirin alone without the Plavix given her previous recent history of abdominal hematoma on the combination therapy. ----- Message ----- From: Truitt Merle, MD Sent: 02/15/2019  11:03 AM EDT To: Garvin Fila, MD  Dr. Leonie Man,  I am not sure if you saw her when she was recently in [facility] for stroke, but she is scheduled to see you in early June.  She was put on aspirin and plavix after her stroke, and she was also on low-dose Lovenox for DVT prophylaxis.  Unfortunately she developed acute abdominal wall hematoma in the rehab, or the anticoagulation was stopped.  She is recovering well at home, her granddaughter is a physical therapist.  She has restarted baby aspirin yesterday.  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  She was started on anagrelide as soon as she gets the prescription filled.  Due to her recent large hematoma, and her risk of bleeding from severe thrombocytosis, would you be okay to hold Plavix for additional 2 to 3 weeks, continue her platelet counts came down? Pt was told to restart plavix next week.   Let me know what your thoughts are, and I will communicate with pt and her granddaughter  Thanks much,  Jill Castaneda

## 2019-02-15 NOTE — Telephone Encounter (Signed)
Left voice message for patient's granddaughter Ileana Roup, per Dr. Burr Medico patient is to continue on aspirin only, not Plavix until her follow up with Dr. Leonie Man.  Encouraged her to call back if she has any questions.

## 2019-02-15 NOTE — Telephone Encounter (Signed)
Scheduled appt per 4/16 los. °

## 2019-02-15 NOTE — Progress Notes (Signed)
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 She presents to the clinic today in a wheelchair with Nurse Tech given recent CVA. I called her daughter to be included in the visit and be her Jill Castaneda.  Her granddaughter does not complain of pain. She granddaughter feels her pain is improving but still hurt to cough with LLQ pain. She refusing her pain meds and tylenol. She denies any bleeding. She is on baby aspirin.  She has a foley catheter and will f/u with Urologist in 2 weeks. She will produce 1000-1400 ml a day. She will f/u with neurologist in 04/2019.  Her granddaughter Jill Castaneda is a PT is doing her catheter care and PT exercises with her. She sleeps a lot but she will go outside to walk.   Jill Castaneda was on face-to-face call during her visit with [place] and translated for her.   REVIEW OF SYSTEMS:   Constitutional: Denies fevers, chills or abnormal weight loss Eyes: Denies blurriness of vision Ears, nose, mouth, throat, and face: Denies mucositis or sore throat Respiratory: Denies cough, dyspnea or wheezes Cardiovascular: Denies palpitation, chest discomfort or lower extremity swelling Gastrointestinal:  Denies nausea, heartburn or change in bowel habits (+) LLQ pain and pain with coughing  UA: (+) Foley catheter in place  Skin: Denies abnormal skin rashes Lymphatics: Denies new lymphadenopathy or easy bruising Neurological:Denies numbness, tingling or new weaknesses (+) Improved extremity strength  Behavioral/Psych: Mood is stable, no new changes   All other systems were reviewed with the patient and are negative.  MEDICAL HISTORY:  Past Medical History:  Diagnosis Date  . Cataract   . Complication of anesthesia    Difficulty waking up "  . Coronary artery disease   . Hip fracture ([place]) 06/2017   right hip  . Hypertension   . Osteoporosis   . Stroke [facility])     SURGICAL HISTORY: Past Surgical History:  Procedure Laterality Date  . ANTERIOR APPROACH HEMI HIP ARTHROPLASTY Right 06/20/2017   Procedure: ANTERIOR APPROACH HEMI HIP ARTHROPLASTY;  Surgeon: Rod Can, MD;  Location: [place];  Service: Orthopedics;  Laterality: Right;  . FRACTURE SURGERY    . SHOULDER HARDWARE REMOVAL Left   . SHOULDER HEMI-ARTHROPLASTY Left   . TOTAL HIP ARTHROPLASTY Right     I have reviewed the social history and family history with the patient and they are unchanged from previous note.  ALLERGIES:  is allergic to tamsulosin hcl.  MEDICATIONS:  Current Outpatient Medications  Medication Sig Dispense Refill  . acetaminophen (TYLENOL) 325 MG tablet Take 2 tablets (650 mg total) by mouth every 4 (four) hours as needed for mild pain (or temp > 37.5 C (99.5 F)).    Marland Kitchen amLODipine (NORVASC) 2.5 MG tablet Take 1 tablet (2.5 mg total) by mouth daily. 30 tablet 1  . anagrelide (AGRYLIN) 0.5 MG capsule Take 1 capsule (0.5 mg total) by mouth 2 (two) times daily. 60 capsule 2  . Ascorbic Acid (VITAMIN C) 1000 MG tablet Take 1,000 mg by mouth daily.    Marland Kitchen atorvastatin (LIPITOR) 40 MG  tablet Take 1 tablet (40 mg total) by mouth daily at 6 PM. 30 tablet 0  . calcium-vitamin D (OSCAL WITH D) 500-200 MG-UNIT tablet Take 1 tablet by mouth 3 (three) times a week.    . Multiple Vitamin (MULTIVITAMIN WITH MINERALS) TABS tablet Take 2 tablets by mouth daily.     . pantoprazole (PROTONIX) 40 MG tablet Take 1 tablet (40 mg total) by mouth daily. 30 tablet 0   No current facility-administered medications for this visit.     PHYSICAL EXAMINATION: ECOG PERFORMANCE  STATUS: 3 - Symptomatic, >50% confined to bed  Vitals:   02/15/19 0940  BP: 110/67  Pulse: 76  Resp: 17  Temp: 98.1 F (36.7 C)  SpO2: 92%   Filed Weights    GENERAL:alert, no distress and comfortable SKIN: skin color, texture, turgor are normal, no rashes or significant lesions EYES: normal, Conjunctiva are pink and non-injected, sclera clear OROPHARYNX:no exudate, no erythema and lips, buccal mucosa, and tongue normal  NECK: supple, thyroid normal size, non-tender, without nodularity LYMPH:  no palpable lymphadenopathy in the cervical, axillary or inguinal LUNGS: clear to auscultation and percussion with normal breathing effort HEART: regular rate & rhythm and no murmurs and no lower extremity edema ABDOMEN:abdomen soft, non-tender and normal bowel sounds (+) LLQ grapefruit size ecchymosis (+) Foley catheter in place  Musculoskeletal:no cyanosis of digits and no clubbing  NEURO: alert & oriented x 3 with fluent speech, no focal motor/sensory deficits (+) In wheelchair (+) Improving extremity strength  LABORATORY DATA:  I have reviewed the data as listed CBC Latest Ref Rng & Units 02/15/2019 02/09/2019 02/08/2019  WBC 4.0 - 10.5 K/uL 10.5 10.5 9.5  Hemoglobin 12.0 - 15.0 g/dL 12.2 9.9(L) 9.5(L)  Hematocrit 36.0 - 46.0 % 39.7 30.9(L) 30.7(L)  Platelets 150 - 400 K/uL 1,106(H) 871(H) 782(H)     CMP Latest Ref Rng & Units 02/15/2019 02/05/2019 02/01/2019  Glucose 70 - 99 mg/dL 120(H) 95 94  BUN 8 - 23 mg/dL 15 20 23   Creatinine 0.44 - 1.00 mg/dL 1.03(H) 1.03(H) 0.96  Sodium 135 - 145 mmol/L 139 138 141  Potassium 3.5 - 5.1 mmol/L 4.2 4.1 3.8  Chloride 98 - 111 mmol/L 106 106 111  CO2 22 - 32 mmol/L 24 25 27   Calcium 8.9 - 10.3 mg/dL 8.6(L) 8.7(L) 8.5(L)  Total Protein 6.5 - 8.1 g/dL 6.6 - -  Total Bilirubin 0.3 - 1.2 mg/dL 1.1 - -  Alkaline Phos 38 - 126 U/L 56 - -  AST 15 - 41 U/L 21 - -  ALT 0 - 44 U/L 10 - -      RADIOGRAPHIC STUDIES: I have personally reviewed the  radiological images as listed and agreed with the findings in the report. No results found.   ASSESSMENT & PLAN:  Jill Castaneda is a 85 y.o. female with   1. 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  - I have reviewed her lab results, folate and B12 levels were normal, will check ferritin and iron level  -Given the chronic course of worsening thrombocytosis over 5 years, this is likely MPN, especially essential thrombocythemia.  I do not have high suspicion for malignancy related secondary thrombocytosis. -Will get JAK2 mutation test, if negative, will get MPN panel. Due to her advanced age, I do not plan to do a bone marrow biopsy. Jak2 testing is still pending.  -She received phlebotomy in hospital  given elevated HCT. She may continue as needed.  -She initially started with hydrea 567m in hospital on 02/01/19. She only completed 2 doses but developed abdominal pain and LLQ hematoma in hospital. -Given her very high platelet count, recurrent stroke, I think she is at high risk for recurrent thrombosis especially stroke, she is also at risk for bleeding due to platelet dysfunction, I recommend treatment for her thrombocytosis  -She is doing better since being discharged. Her extremity strength and abdominal pain is improving.  -I will start her on anagrelide 0.5 mg twice daily. She will start as soon as she picks up prescription.  -Labs revewied today, H/H resolved, PLT at 1.1 million, ANC 8.4 -F/u in 2 months, She will do outside CBC labs in interim.    2. H/o PE after hip replacement, Recurrent CVA -She is recovering well with improved extremity strength. She will continue PT with her Granddaughter.  -Will f/u with neurologist Dr. SWillaim Rayasin 04/2019  -She recently restarted baby aspirin. Given thrombocytosis, will wait to restart Plavix. Will discuss with her neurologist   3. HTN -On  Amlodipine  -BP at 110/67 today (02/15/19)   4. LLQ abdominal wall Hematoma, anemia  -Anemia secondary to hematoma  -Aspirin and Plavix has was previously stopped -anemia resolved now, hg at 12.2 today (02/15/19)   5. Social and FAcupuncturist-Pt does not have insurance. I offered them meeting with Social work so she can apply for [facility] [employer] and other assistance.  -Her granddaughter is her caregiver who is also a PT.    PLAN:  -Start Anegrelide as soon as she receives it from [facility] -lab and f/u in 2 months, she will repeat CBC and diff at local lab in 2 and 4 weeks after she starts Anagrelide    No problem-specific Assessment & Plan notes found for this encounter.   No orders of the defined types were placed in this encounter.  All questions were answered. The patient knows to call the clinic with any problems, questions or concerns. No barriers to learning was detected. I spent 20 minutes counseling the patient face to face. The total time spent in the appointment was 25 minutes and more than 50% was on counseling and review of test results     YTruitt Merle MD 02/15/2019   I, AJoslyn Devon am acting as scribe for YTruitt Merle MD.   I have reviewed the above documentation for accuracy and completeness, and I agree with the above.

## 2019-02-15 NOTE — Telephone Encounter (Signed)
Transitional Care call-granddaughter [facility]    1. Are you/is patient experiencing any problems since coming home? No Are there any questions regarding any aspect of care? No 2. Are there any questions regarding medications administration/dosing? No Are meds being taken as prescribed? Yes Patient should review meds with caller to confirm 3. Have there been any falls? No 4. Has Home Health been to the house and/or have they contacted you? Granddaughter is a Community education officer and is working with patient at home If not, have you tried to contact them? Can we help you contact them? 5. Are bowels and bladder emptying properly? Has a catheter Are there any unexpected incontinence issues?No If applicable, is patient following bowel/bladder programs? 6. Any fevers, problems with breathing, unexpected pain? No 7. Are there any skin problems or new areas of breakdown? No 8. Has the patient/family member arranged specialty MD follow up (ie cardiology/neurology/renal/surgical/etc)? No Can we help arrange? 9. Does the patient need any other services or support that we can help arrange? No 10. Are caregivers following through as expected in assisting the patient? Yes 11. Has the patient quit smoking, drinking alcohol, or using drugs as recommended? Yes  Appointment time 10:15 am arrive time 10:00 am on Mar 29, 2019 with Dr. Letta Pate [phone]

## 2019-02-20 ENCOUNTER — Telehealth: Payer: Self-pay | Admitting: *Deleted

## 2019-02-20 NOTE — Telephone Encounter (Signed)
Informed patient will be enrolled for [employer] to ship a 30 day cardiac event monitor to her home.  Self pay price of $325. Patient / patient representative will need to contact [employer] billing dept. At [phone].  Instructions reviewed briefly as they will be included in the kit.  Reminded to call [employer] to send baseline recording once monitor applied.

## 2019-02-22 ENCOUNTER — Telehealth: Payer: Self-pay | Admitting: Hematology

## 2019-02-22 NOTE — Telephone Encounter (Signed)
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  Patient does not need a bone biopsy biopsy to confirm. I left  A VM about the above, and also asked her to call back if she has refilled anagrelide or not. There is a Environmental education officer now.   Truitt Merle  02/22/2019

## 2019-02-23 LAB — JAK2 (INCLUDING V617F AND EXON 12), MPL,& CALR W/RFL MPN PANEL (NGS)

## 2019-03-01 ENCOUNTER — Telehealth: Payer: Self-pay

## 2019-03-01 NOTE — Telephone Encounter (Signed)
Spoke with patient's granddaughter regarding lab results, per Dr. Burr Medico patient does not need phlebotomy at this time.  Platelets are still very high.  She verbalized an understanding.

## 2019-03-29 ENCOUNTER — Encounter: Payer: Self-pay | Admitting: Physical Medicine & Rehabilitation

## 2019-03-30 ENCOUNTER — Inpatient Hospital Stay: Payer: Self-pay | Admitting: Physical Medicine & Rehabilitation

## 2019-04-11 ENCOUNTER — Telehealth: Payer: Self-pay

## 2019-04-11 NOTE — Telephone Encounter (Signed)
LEft vm for pts granddaughter Jill Castaneda that pts appt will be change to virtual video visit due to COVID 19. It will not be in office visit. We need verbal consent from granddaughter to do video visit and to file insurance. Need to know where to send link for video.Also we need to know cell phone carrier link.

## 2019-04-16 ENCOUNTER — Telehealth: Payer: Self-pay

## 2019-04-16 NOTE — Telephone Encounter (Signed)
I called pts granddaughter [name] that pts appt will be change to doxy video visit due to COVID !9 pandemic.She gave verbal consent to do video and to file insurance. [name] has a cell phone with a camera and a email address. I explain the doxy process and email was sent to her yahoo address. The daughter stated she can interpreted for her grandmother on video visit. I stated to click link in email 10 minutes early. She stated pt has emergency medicaid and wanted to know if we had it in the system. I transfer her to billing to inquire about the video visit for tomorrow.

## 2019-04-17 ENCOUNTER — Encounter: Payer: Self-pay | Admitting: Neurology

## 2019-04-17 ENCOUNTER — Other Ambulatory Visit: Payer: Self-pay

## 2019-04-17 ENCOUNTER — Ambulatory Visit (INDEPENDENT_AMBULATORY_CARE_PROVIDER_SITE_OTHER): Payer: Self-pay | Admitting: Neurology

## 2019-04-17 DIAGNOSIS — D473 Essential (hemorrhagic) thrombocythemia: Secondary | ICD-10-CM | POA: Insufficient documentation

## 2019-04-17 DIAGNOSIS — R1310 Dysphagia, unspecified: Secondary | ICD-10-CM

## 2019-04-17 DIAGNOSIS — I6381 Other cerebral infarction due to occlusion or stenosis of small artery: Secondary | ICD-10-CM

## 2019-04-17 NOTE — Progress Notes (Signed)
Virtual Visit via Video Note  I connected with Jill Castaneda on 04/17/19 at  9:30 AM EDT by a video enabled telemedicine application and verified that I am speaking with the correct person using two identifiers.  Location: Patient: at home with grand daughter Provider: at [facility] office  I discussed the limitations of evaluation and management by telemedicine and the availability of in person appointments. The patient expressed understanding and agreed to proceed.  This meeting was done using [email or web address] app for audio and visual.  The patient's granddaughter was present throughout the visit and facilitated it.  History of Present Illness: [name] is seen today for virtual video office follow-up visit following recent hospital admission for stroke in March 2020.  History is obtained from the patient and her granddaughter who serves as a Astronomer and review of electronic medical records.  I have personally reviewed imaging films in PACS.  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  She did not receive TPA due to delay in presentation.CT scan of the head CT scan of the head showed advanced chronic ischemic changes with bilateral remote age lacunar infarcts of indeterminate age.  MRI showed an acute infarct in the posterior limb of the internal capsule on the right extending into the carotid body as well as small right parietal acute infarct as well.  CT angiogram of the head and neck showed no significant large vessel stenosis.  2D echo showed normal ejection fraction.  Lower extremity venous Dopplers were negative for DVT.  LDL cholesterol was elevated at 154 mg percent and hemoglobin A1c was 5.8.  Patient had  elevated platelet count of 1 million and had a diagnosis of polycythemia with thrombocytosis.  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  She was seen by hematology and started on anagrelide.  Dr. Rigoberto Noel saw the patient in the hospital and recommended outpatient 30-day cardiac monitor for A. fib however since the patient has been home family has decided against doing it as she would not be a good long-term anticoagulation candidate.  Patient has finished inpatient rehab is presently at home.  Her dysphagia seems to have improved she is on a diet now but still has occasional difficulty with liquids and coughs.  She had a recent modified barium and was not found to have silent aspiration.  Her left-sided coordination and strength has improved.  She has not gotten her stamina back to her baseline.  She walks a little bit with a walker but cannot walk more than 1000 feet now.  She was previously independent and walking without assistance.  She is also stopped taking aspirin as it was bothering her stomach and had increased with distention and hematoma.  She is also stopped her blood pressure and cholesterol medicines as well as anagrelide also. Observations/Objective: Physical and neurological exam limited due to constraints of virtual video visit.  Frail malnourished looking elderly lady not in distress.  She appears awake alert and oriented.  She follows simple commands.  Extraocular movements appear full range do not see any nystagmus.  Face appears symmetric.  Tongue is midline.  Motor system exam shows symmetric upper and lower extremity strength but fine finger movements are diminished on the left.  She orbits right  over left upper extremity.  She has coarse action tremor of the right upper extremity which increases with intention.  I do not see a tremor on the left.  Her gait was not tested.  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  Vascular risk factors of hyperlipidemia, previous strokes. Plan : I had a long discussion with the patient and her granddaughter regarding her recurrent strokes and discuss treatment plan.  The patient is having dysphagia and trouble with swallowing her pills and hence she has stopped most of her medications.  She also has essential thrombocytosis which is likely responsible for her strokes.  The patient may start aspirin if she can swallow and wishes to take it and maintain aggressive risk factor modification with strict control of hypertension with blood pressure goal below 130/90, lipids with LDL cholesterol goal below 70 mg percent and diabetes with hemoglobin A1c goal below 6.5%.  She was encouraged to use a walker for ambulation and to eat healthy diet to maintain in duration. follow Up Instructions: Follow-up in the future only as necessary and no scheduled appointment was made   I discussed the assessment and treatment plan with the patient. The patient was provided an opportunity to ask questions and all were answered. The patient agreed with the plan and demonstrated an understanding of the instructions.   The patient was advised to call back or seek an in-person evaluation if the symptoms worsen or if the condition fails to improve as anticipated.  I provided 78minutes of non-face-to-face time during this encounter.   Antony Contras, MD

## 2019-04-18 ENCOUNTER — Inpatient Hospital Stay: Payer: Self-pay | Admitting: Hematology

## 2019-04-18 ENCOUNTER — Inpatient Hospital Stay: Payer: Self-pay | Attending: Hematology

## 2019-04-19 ENCOUNTER — Telehealth: Payer: Self-pay

## 2019-04-19 NOTE — Telephone Encounter (Signed)
Left voice message for patient's granddaughter [name] regarding this patient's missed appointments on 6/17, inquiring about whether she is currently being treated and her current status.  Asked that she return my call.

## 2019-08-30 ENCOUNTER — Telehealth: Payer: Self-pay

## 2019-08-30 NOTE — Telephone Encounter (Signed)
Patient's granddaughter Jill Castaneda called to give Dr. Burr Medico an update on her grandmother.  Her grandmother chose not to take any medications for her condition.  She is doing well.  She states if Dr. Burr Medico thinks she needs to have blood work done they can get her to a nearby lab.    Her number is [phone]

## 2019-08-30 NOTE — Telephone Encounter (Signed)
I called Jill Castaneda back. If she does not want to try medication for her MPN, I will not recommend routine lab work. I will be happy to see her back If needed. Jill Castaneda voiced good understanding and appreciated the call.   Truitt Merle MD

## 2019-12-11 IMAGING — CT CT ABDOMEN AND PELVIS WITH CONTRAST
2 of 5 series · 16 of 46 positions shown, 18 images · IV contrast (APPLIED)
Comparison: None.

CLINICAL DATA: Pulsatile abdominal mass

EXAM:
CT ABDOMEN AND PELVIS WITH CONTRAST
TECHNIQUE: Multidetector CT imaging of the abdomen and pelvis was performed
using the standard protocol following bolus administration of
intravenous contrast.
CONTRAST:  100mL OMNIPAQUE IOHEXOL 300 MG/ML  SOLN

[Series 3: abdomen 5.0 · axial · 0.70mm/px · z∈[+934,+1259]mm · 13 of 76 slices shown, 15 images]
[im 6/76  soft-tissue]
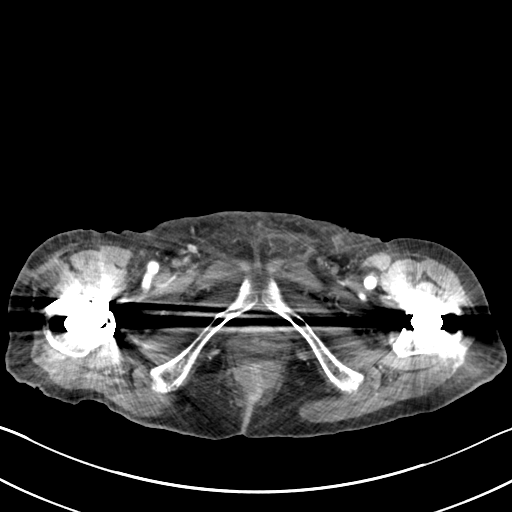
[im 6/76  bone]
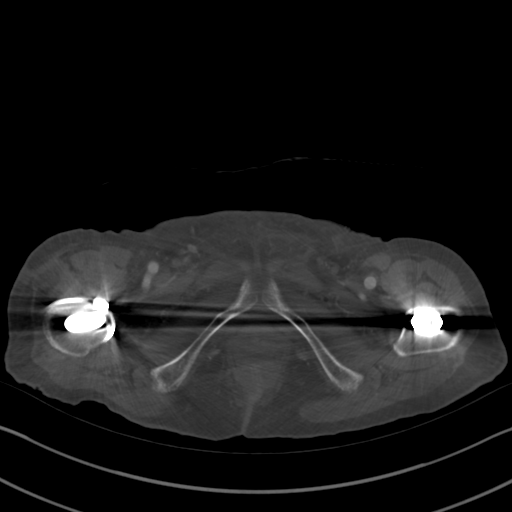
[im 11/76  soft-tissue]
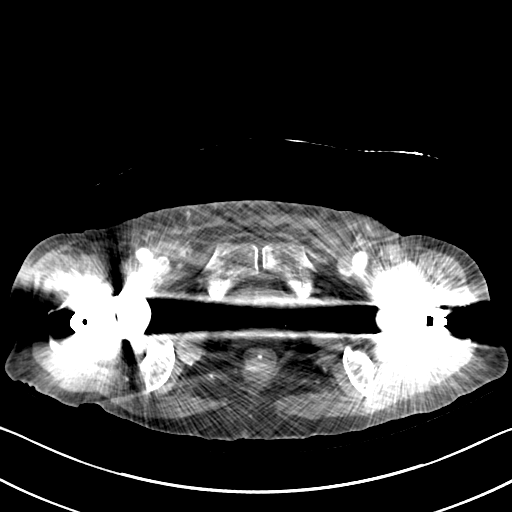
[im 16/76  soft-tissue]
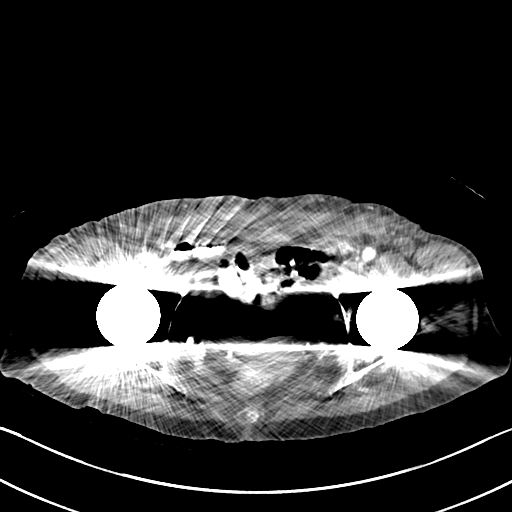
[im 21/76  soft-tissue]
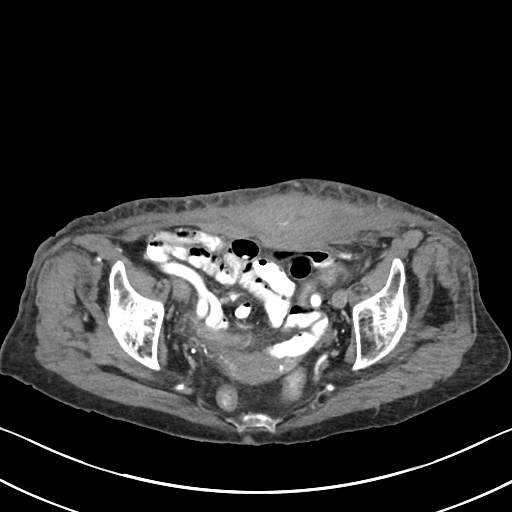
[im 26/76  soft-tissue]
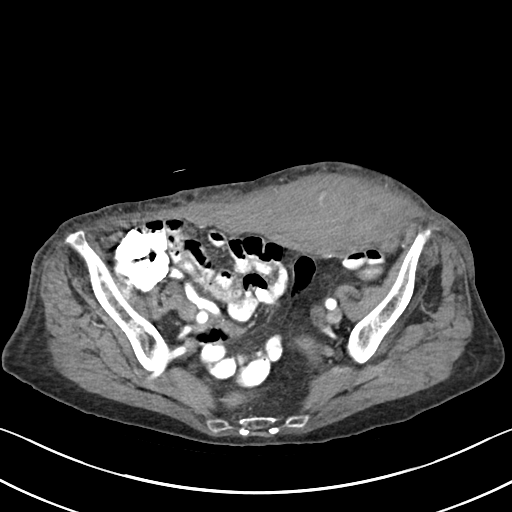
[im 31/76  soft-tissue]
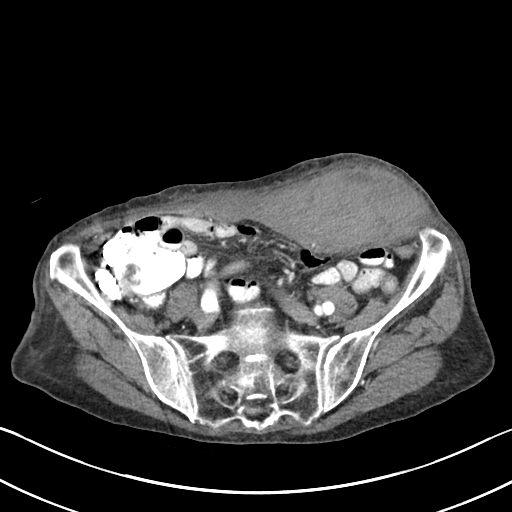
[im 41/76  soft-tissue]
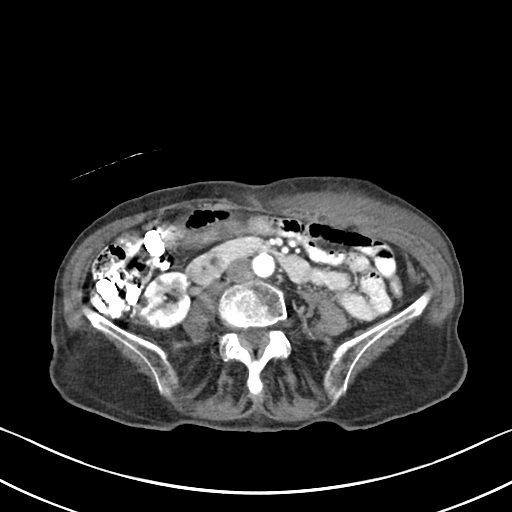
[im 46/76  soft-tissue]
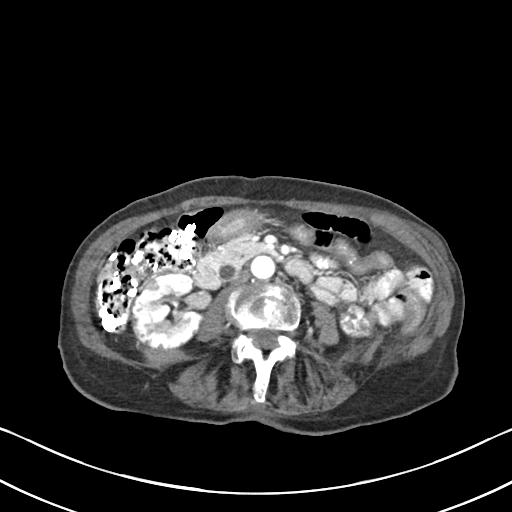
[im 51/76  soft-tissue]
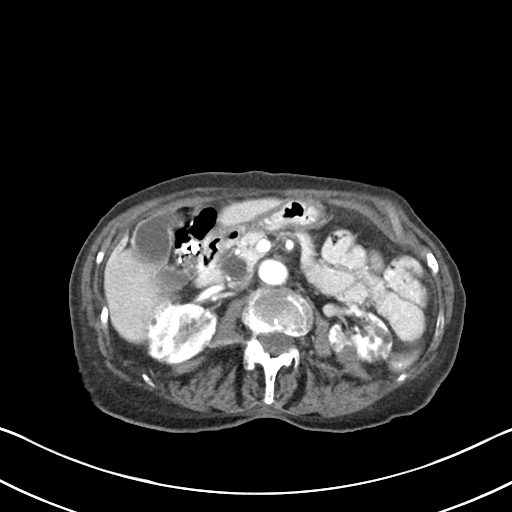
[im 51/76  bone]
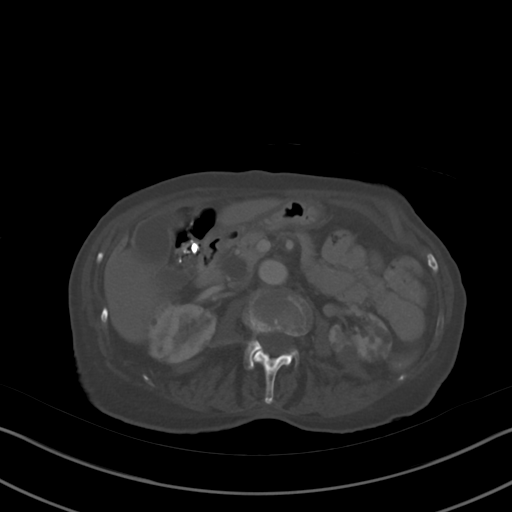
[im 56/76  soft-tissue]
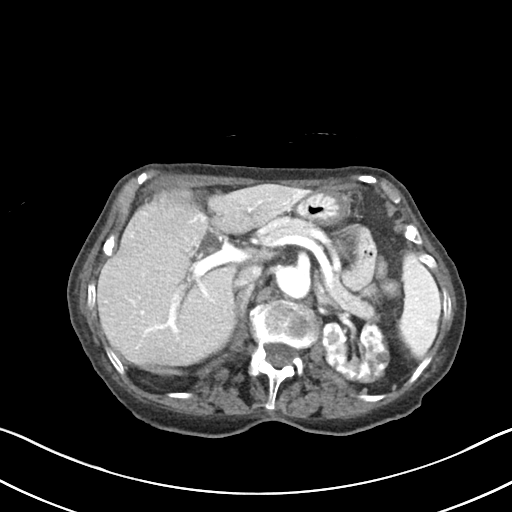
[im 61/76  soft-tissue]
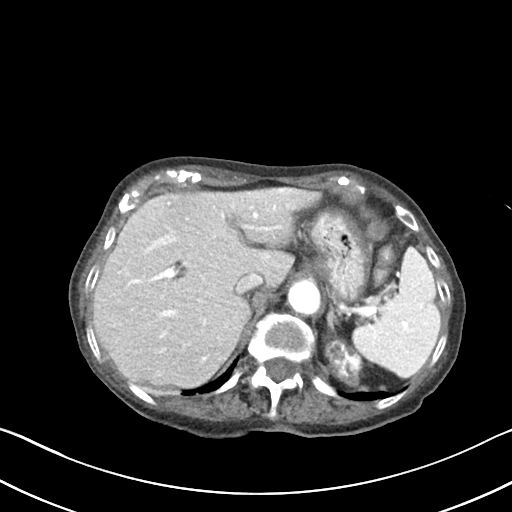
[im 66/76  soft-tissue]
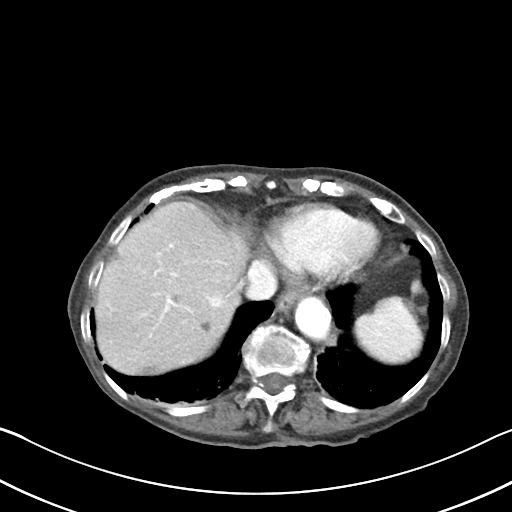
[im 71/76  soft-tissue]
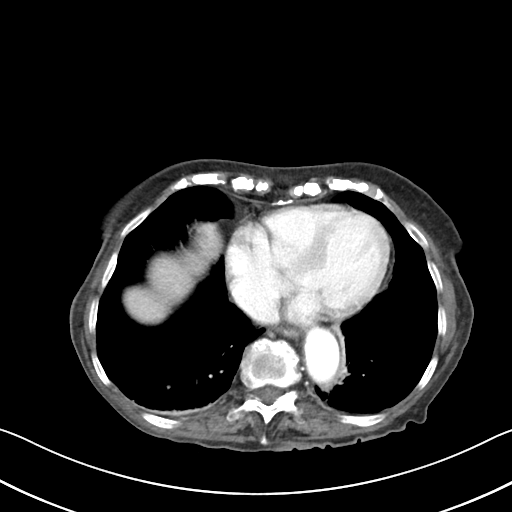

[Series 6: abdomen 3.0 mpr cor · coronal · 0.65mm/px · 3 of 83 slices shown]
[im 28/83  soft-tissue]
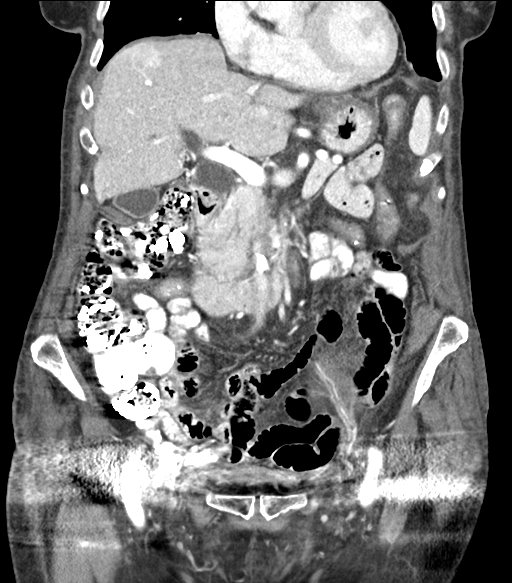
[im 37/83  soft-tissue]
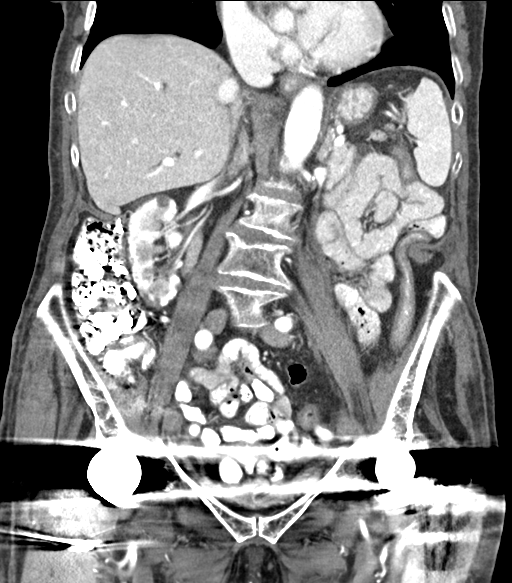
[im 46/83  soft-tissue]
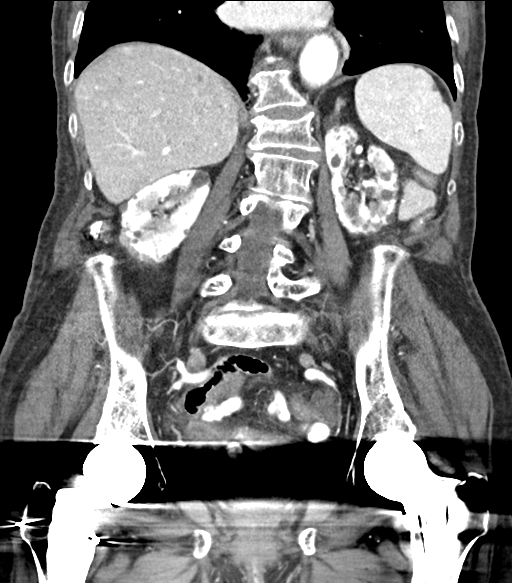

[16 of 46 positions shown; findings below may reference images not displayed]

FINDINGS: LOWER CHEST: There is no basilar pleural or apical pericardial
effusion.

HEPATOBILIARY: The hepatic contours and density are normal. There is
no intra- or extrahepatic biliary dilatation. The gallbladder is
normal.

PANCREAS: The pancreatic parenchymal contours are normal and there
is no ductal dilatation. There is no peripancreatic fluid
collection.

SPLEEN: Normal.

ADRENALS/URINARY TRACT:

--Adrenal glands: Normal.

--Right kidney/ureter: Numerous small cysts.

--Left kidney/ureter: Numerous small cysts.

--Urinary bladder: Normal for degree of distention

STOMACH/BOWEL:

--Stomach/Duodenum: There is no hiatal hernia or other gastric
abnormality. The duodenal course and caliber are normal.

--Small bowel: No dilatation or inflammation.

--Colon: No focal abnormality.

--Appendix: Not visualized. No right lower quadrant inflammation or
free fluid.

VASCULAR/LYMPHATIC: There is aortic atherosclerosis without
hemodynamically significant stenosis. The portal vein, splenic vein,
superior mesenteric vein and IVC are patent. No abdominal or pelvic
lymphadenopathy.

REPRODUCTIVE: No free fluid in the pelvis.

MUSCULOSKELETAL. Multiple lumbar compression deformities, which
appear chronic.

OTHER: There is a left lower quadrant anterior abdominal wall
hematoma that measures approximately 15 x 6 x 11 cm with evidence of
active extravasation ([DATE], [DATE]).
IMPRESSION: 1. Left lower quadrant anterior abdominal wall hematoma measuring 15
x 6 x 11 cm with active extravasation.
2. Numerous small bilateral renal cysts which are nonspecific but
may be seen in the setting of lithium therapy or chronic dialysis.
3. Multiple age-indeterminate compression deformities of the lumbar
spine, favored to be chronic.
4.  Aortic atherosclerosis ([id number]).

Critical Value/emergent results were called by telephone at the time
of interpretation on 02/06/2019 at [DATE] to PAULUS N CEEJAY , who
verbally acknowledged these results.

## 2020-02-28 ENCOUNTER — Emergency Department (HOSPITAL_COMMUNITY): Payer: Self-pay

## 2020-02-28 ENCOUNTER — Emergency Department (HOSPITAL_BASED_OUTPATIENT_CLINIC_OR_DEPARTMENT_OTHER): Payer: Self-pay

## 2020-02-28 ENCOUNTER — Emergency Department (HOSPITAL_COMMUNITY)
Admission: EM | Admit: 2020-02-28 | Discharge: 2020-02-28 | Disposition: A | Discharge status: Home / Self Care | Payer: Self-pay | Attending: Emergency Medicine | Admitting: Emergency Medicine

## 2020-02-28 ENCOUNTER — Encounter (HOSPITAL_COMMUNITY): Payer: Self-pay | Admitting: Pediatrics

## 2020-02-28 ENCOUNTER — Other Ambulatory Visit: Payer: Self-pay

## 2020-02-28 DIAGNOSIS — I252 Old myocardial infarction: Secondary | ICD-10-CM | POA: Insufficient documentation

## 2020-02-28 DIAGNOSIS — I10 Essential (primary) hypertension: Secondary | ICD-10-CM | POA: Insufficient documentation

## 2020-02-28 DIAGNOSIS — D75839 Thrombocytosis, unspecified: Secondary | ICD-10-CM

## 2020-02-28 DIAGNOSIS — I712 Thoracic aortic aneurysm, without rupture, unspecified: Secondary | ICD-10-CM

## 2020-02-28 DIAGNOSIS — Z79899 Other long term (current) drug therapy: Secondary | ICD-10-CM | POA: Insufficient documentation

## 2020-02-28 DIAGNOSIS — Z86711 Personal history of pulmonary embolism: Secondary | ICD-10-CM | POA: Insufficient documentation

## 2020-02-28 DIAGNOSIS — I2510 Atherosclerotic heart disease of native coronary artery without angina pectoris: Secondary | ICD-10-CM | POA: Insufficient documentation

## 2020-02-28 DIAGNOSIS — M7989 Other specified soft tissue disorders: Secondary | ICD-10-CM

## 2020-02-28 DIAGNOSIS — R609 Edema, unspecified: Secondary | ICD-10-CM

## 2020-02-28 DIAGNOSIS — R0602 Shortness of breath: Secondary | ICD-10-CM | POA: Insufficient documentation

## 2020-02-28 DIAGNOSIS — R2242 Localized swelling, mass and lump, left lower limb: Secondary | ICD-10-CM | POA: Insufficient documentation

## 2020-02-28 DIAGNOSIS — D473 Essential (hemorrhagic) thrombocythemia: Secondary | ICD-10-CM | POA: Insufficient documentation

## 2020-02-28 LAB — CBC WITH DIFFERENTIAL/PLATELET
Abs Immature Granulocytes: 0.04 10*3/uL (ref 0.00–0.07)
Basophils Absolute: 0.2 10*3/uL — ABNORMAL HIGH (ref 0.0–0.1)
Basophils Relative: 2 %
Eosinophils Absolute: 0.2 10*3/uL (ref 0.0–0.5)
Eosinophils Relative: 2 %
HCT: 55.9 % — ABNORMAL HIGH (ref 36.0–46.0)
Hemoglobin: 16.6 g/dL — ABNORMAL HIGH (ref 12.0–15.0)
Immature Granulocytes: 0 %
Lymphocytes Relative: 11 %
Lymphs Abs: 1 10*3/uL (ref 0.7–4.0)
MCH: 23.4 pg — ABNORMAL LOW (ref 26.0–34.0)
MCHC: 29.7 g/dL — ABNORMAL LOW (ref 30.0–36.0)
MCV: 79 fL — ABNORMAL LOW (ref 80.0–100.0)
Monocytes Absolute: 0.8 10*3/uL (ref 0.1–1.0)
Monocytes Relative: 8 %
Neutro Abs: 7.1 10*3/uL (ref 1.7–7.7)
Neutrophils Relative %: 77 %
Platelets: 1126 10*3/uL (ref 150–400)
RBC: 7.08 MIL/uL — ABNORMAL HIGH (ref 3.87–5.11)
RDW: 18.5 % — ABNORMAL HIGH (ref 11.5–15.5)
WBC: 9.3 10*3/uL (ref 4.0–10.5)
nRBC: 0 % (ref 0.0–0.2)

## 2020-02-28 LAB — COMPREHENSIVE METABOLIC PANEL
ALT: 11 U/L (ref 0–44)
AST: 25 U/L (ref 15–41)
Albumin: 3.4 g/dL — ABNORMAL LOW (ref 3.5–5.0)
Alkaline Phosphatase: 61 U/L (ref 38–126)
Anion gap: 11 (ref 5–15)
BUN: 16 mg/dL (ref 8–23)
CO2: 24 mmol/L (ref 22–32)
Calcium: 9 mg/dL (ref 8.9–10.3)
Chloride: 107 mmol/L (ref 98–111)
Creatinine, Ser: 1.23 mg/dL — ABNORMAL HIGH (ref 0.44–1.00)
GFR calc Af Amer: 45 mL/min — ABNORMAL LOW (ref >60)
GFR calc non Af Amer: 39 mL/min — ABNORMAL LOW (ref >60)
Glucose, Bld: 120 mg/dL — ABNORMAL HIGH (ref 70–99)
Potassium: 4.1 mmol/L (ref 3.5–5.1)
Sodium: 142 mmol/L (ref 135–145)
Total Bilirubin: 1 mg/dL (ref 0.3–1.2)
Total Protein: 6.5 g/dL (ref 6.5–8.1)

## 2020-02-28 LAB — TROPONIN I (HIGH SENSITIVITY)
Troponin I (High Sensitivity): 32 ng/L — ABNORMAL HIGH (ref <18)
Troponin I (High Sensitivity): 27 ng/L — ABNORMAL HIGH (ref <18)

## 2020-02-28 LAB — POCT I-STAT EG7
Acid-Base Excess: 0 mmol/L (ref 0.0–2.0)
Bicarbonate: 26.6 mmol/L (ref 20.0–28.0)
Calcium, Ion: 1.22 mmol/L (ref 1.15–1.40)
HCT: 55 % — ABNORMAL HIGH (ref 36.0–46.0)
Hemoglobin: 18.7 g/dL — ABNORMAL HIGH (ref 12.0–15.0)
O2 Saturation: 93 %
Patient temperature: 98.6
Potassium: 4.1 mmol/L (ref 3.5–5.1)
Sodium: 141 mmol/L (ref 135–145)
TCO2: 28 mmol/L (ref 22–32)
pCO2, Ven: 47 mmHg (ref 44.0–60.0)
pH, Ven: 7.361 (ref 7.250–7.430)
pO2, Ven: 71 mmHg — ABNORMAL HIGH (ref 32.0–45.0)

## 2020-02-28 LAB — PROTIME-INR
INR: 1 (ref 0.8–1.2)
INR: 1.1 (ref 0.8–1.2)
Prothrombin Time: 13.2 seconds (ref 11.4–15.2)
Prothrombin Time: 13.4 seconds (ref 11.4–15.2)

## 2020-02-28 LAB — BRAIN NATRIURETIC PEPTIDE: B Natriuretic Peptide: 122.3 pg/mL — ABNORMAL HIGH (ref 0.0–100.0)

## 2020-02-28 MED ORDER — IOHEXOL 350 MG/ML SOLN
100.0000 mL | Freq: Once | INTRAVENOUS | Status: AC | PRN
  Administered 2020-02-28: 20:00:00 100 mL via INTRAVENOUS

## 2020-02-28 MED ORDER — AMLODIPINE BESYLATE 5 MG PO TABS: 5.0000 mg | ORAL_TABLET | Freq: Every day | ORAL | 0 refills | Status: DC

## 2020-02-28 MED ORDER — AMLODIPINE BESYLATE 5 MG PO TABS
5.0000 mg | ORAL_TABLET | Freq: Once | ORAL | Status: AC
  Administered 2020-02-28: 5 mg via ORAL
  Filled 2020-02-28: qty 1

## 2020-02-28 NOTE — ED Provider Notes (Signed)
[facility] EMERGENCY DEPARTMENT Provider Note   CSN: [id number] Arrival date & time: 02/28/20  1309     History Chief Complaint  Patient presents with  . Leg Swelling    Jill Castaneda is a 86 y.o. female history of CAD, hypertension, stroke, essential thrombocytosis, PE not on anticoagulation here presenting with shortness of breath, left leg swelling.  Patient has been having left leg swelling for the last several days.  Also has some subjective shortness of breath especially with exertion.  Patient was initially noted to be hypoxic 88% but after repositioning, patient's oxygen level came up to 100% on room air.  Patient denies any fevers or chills or cough.  Denies any Covid exposure.  Granddaughter was concerned for possible blood clots.  She has a history of essential thrombocytosis.  Patient did not tolerate phlebotomy per her granddaughter so is not currently getting treated for her thrombocytosis.  The history is provided by the patient.       Past Medical History:  Diagnosis Date  . Cataract   . Complication of anesthesia    Difficulty waking up "  . Coronary artery disease   . Hip fracture ([place]) 06/2017   right hip  . Hypertension   . Osteoporosis   . Stroke [facility])     Patient Active Problem List   Diagnosis Date Noted  . Essential thrombocythemia ([place]) 04/17/2019  . Acute blood loss anemia   . Dysphagia, post-stroke   . Balance problem   . Cerebrovascular accident (CVA) of right basal ganglia ([place]) 01/29/2019  . 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 Dysarthria 01/26/2019  . Coronary artery disease 01/26/2019  . History of stroke with residual deficit 01/26/2019  . CVA (cerebral vascular accident) ([place]) 01/26/2019  . Generalized weakness   . Closed displaced fracture of right femoral neck ([place])   . Hypokalemia   . Thrombocytosis ([place]) 06/20/2017  . Displaced fracture of right femoral neck ([place]) 06/20/2017  . NSTEMI (non-ST elevated  myocardial infarction) ([place]) 04/15/2014  . Non-STEMI (non-ST elevated myocardial infarction) ([place]) 04/13/2014  . Hypertension   . CVA (cerebral infarction) 03/08/2014    Past Surgical History:  Procedure Laterality Date  . ANTERIOR APPROACH HEMI HIP ARTHROPLASTY Right 06/20/2017   Procedure: ANTERIOR APPROACH HEMI HIP ARTHROPLASTY;  Surgeon: Rod Can, MD;  Location: [facility];  Service: Orthopedics;  Laterality: Right;  . FRACTURE SURGERY    . SHOULDER HARDWARE REMOVAL Left   . SHOULDER HEMI-ARTHROPLASTY Left   . TOTAL HIP ARTHROPLASTY Right      OB History   No obstetric history on file.     Family History  Problem Relation Age of Onset  . Hypertension Father   . Heart disease Father   . Hypertension Sister   . Heart disease Sister   . Stroke Mother   . Cancer Brother     Social History   Tobacco Use  . Smoking status: Never Smoker  . Smokeless tobacco: Never Used  Substance Use Topics  . Alcohol use: No  . Drug use: No    Home Medications Prior to Admission medications   Medication Sig Start Date End Date Taking? Authorizing Provider  amLODipine (NORVASC) 2.5 MG tablet Take 1 tablet (2.5 mg total) by mouth daily. 02/09/19  Yes Angiulli, Lavon Paganini, PA-C  acetaminophen (TYLENOL) 325 MG tablet Take 2 tablets (650 mg total) by mouth every 4 (four) hours as needed for mild pain (or temp > 37.5 C (99.5 F)). Patient  not taking: Reported on 02/28/2020 01/29/19   Elgergawy, Silver Huguenin, MD  anagrelide (AGRYLIN) 0.5 MG capsule Take 1 capsule (0.5 mg total) by mouth 2 (two) times daily. Patient not taking: Reported on 02/28/2020 02/14/19   Truitt Merle, MD  atorvastatin (LIPITOR) 40 MG tablet Take 1 tablet (40 mg total) by mouth daily at 6 PM. Patient not taking: Reported on 02/28/2020 02/08/19   Angiulli, Lavon Paganini, PA-C  pantoprazole (PROTONIX) 40 MG tablet Take 1 tablet (40 mg total) by mouth daily. Patient not taking: Reported on 02/28/2020 02/08/19   Angiulli, Lavon Paganini, PA-C     Allergies    Tamsulosin hcl  Review of Systems   Review of Systems  Respiratory: Positive for shortness of breath.   Musculoskeletal:       L leg swelling   All other systems reviewed and are negative.   Physical Exam Updated Vital Signs BP (!) 177/111   Pulse 60   Temp 98.6 F (37 C) (Oral)   Resp 16   Ht 4\' 8"  (1.422 m)   Wt 43.1 kg   SpO2 96%   BMI 21.30 kg/m   Physical Exam Vitals and nursing note reviewed.  Constitutional:      Comments: Chronically ill but not acutely ill   HENT:     Head: Normocephalic.     Nose: Nose normal.     Mouth/Throat:     Mouth: Mucous membranes are moist.  Eyes:     Extraocular Movements: Extraocular movements intact.     Pupils: Pupils are equal, round, and reactive to light.  Cardiovascular:     Rate and Rhythm: Normal rate and regular rhythm.     Pulses: Normal pulses.     Heart sounds: Normal heart sounds.  Pulmonary:     Comments: Diminished bilateral bases  Abdominal:     General: Abdomen is flat.     Palpations: Abdomen is soft.  Musculoskeletal:     Cervical back: Normal range of motion.     Comments: 1 + edema L leg, no obvious cellulitis, 2+ DP pulse   Skin:    General: Skin is warm.     Capillary Refill: Capillary refill takes less than 2 seconds.  Neurological:     General: No focal deficit present.     Mental Status: She is alert and oriented to person, place, and time.  Psychiatric:        Mood and Affect: Mood normal.     ED Results / Procedures / Treatments   Labs (all labs ordered are listed, but only abnormal results are displayed) Labs Reviewed  CBC WITH DIFFERENTIAL/PLATELET - Abnormal; Notable for the following components:      Result Value   RBC 7.08 (*)    Hemoglobin 16.6 (*)    HCT 55.9 (*)    MCV 79.0 (*)    MCH 23.4 (*)    MCHC 29.7 (*)    RDW 18.5 (*)    Platelets 1,126 (*)    Basophils Absolute 0.2 (*)    All other components within normal limits  COMPREHENSIVE METABOLIC  PANEL - Abnormal; Notable for the following components:   Glucose, Bld 120 (*)    Creatinine, Ser 1.23 (*)    Albumin 3.4 (*)    GFR calc non Af Amer 39 (*)    GFR calc Af Amer 45 (*)    All other components within normal limits  BRAIN NATRIURETIC PEPTIDE - Abnormal; Notable for the following components:  B Natriuretic Peptide 122.3 (*)    All other components within normal limits  POCT I-STAT EG7 - Abnormal; Notable for the following components:   pO2, Ven 71.0 (*)    HCT 55.0 (*)    Hemoglobin 18.7 (*)    All other components within normal limits  TROPONIN I (HIGH SENSITIVITY) - Abnormal; Notable for the following components:   Troponin I (High Sensitivity) 32 (*)    All other components within normal limits  TROPONIN I (HIGH SENSITIVITY) - Abnormal; Notable for the following components:   Troponin I (High Sensitivity) 27 (*)    All other components within normal limits  PROTIME-INR  PROTIME-INR  PATHOLOGIST SMEAR REVIEW  BLOOD GAS, VENOUS    EKG EKG Interpretation  Date/Time:  Thursday February 28 2020 13:54:36 EDT Ventricular Rate:  76 PR Interval:  178 QRS Duration: 76 QT Interval:  382 QTC Calculation: 429 R Axis:   -45 Text Interpretation: Atrial-paced rhythm Left anterior fascicular block Possible Inferior infarct , age undetermined Possible Anterolateral infarct , age undetermined Abnormal ECG No significant change since last tracing Confirmed by Wandra Arthurs [phone]) on 02/28/2020 6:07:22 PM   Radiology DG Chest 2 View  Result Date: 02/28/2020 CLINICAL DATA:  Hypoxia and lower extremity swelling. EXAM: CHEST - 2 VIEW COMPARISON:  01/27/2019 and 01/26/2019 FINDINGS: Patient is rotated to the left. Lungs are adequately inflated without lobar consolidation or effusion. Cardiomediastinal silhouette and remainder of the exam is unchanged. IMPRESSION: No active cardiopulmonary disease. Electronically Signed   By: Marin Olp M.D.   On: 02/28/2020 14:25   CT Angio Chest  PE W and/or Wo Contrast  Result Date: 02/28/2020 CLINICAL DATA:  Shortness of breath EXAM: CT ANGIOGRAPHY CHEST WITH CONTRAST TECHNIQUE: Multidetector CT imaging of the chest was performed using the standard protocol during bolus administration of intravenous contrast. Multiplanar CT image reconstructions and MIPs were obtained to evaluate the vascular anatomy. CONTRAST:  127mL OMNIPAQUE IOHEXOL 350 MG/ML SOLN COMPARISON:  CT 06/23/2017. X-ray 02/28/2020 FINDINGS: Cardiovascular: Pulmonary vasculature is adequately opacified to the segmental branch level. No filling defect to suggest pulmonary embolism. Heart size is upper limits of normal. No pericardial effusion. Ascending thoracic aorta measures 4.0 cm in diameter, stable from 06/23/2017. Proximal descending thoracic aorta measures 2.9 cm in diameter. Thoracic aorta is tortuous with scattered atherosclerotic calcification. Mediastinum/Nodes: No axillary, mediastinal, or hilar lymphadenopathy. 6 mm right thyroid lobe nodule. Not clinically significant; no follow-up imaging recommended (ref: J Am Coll Radiol. 2015 Feb;12(2): 143-50). Trachea and esophagus within normal limits. Lungs/Pleura: Bibasilar atelectasis. No focal airspace consolidation, pleural effusion, or pneumothorax. Upper Abdomen: There is reflux of contrast into the hepatic veins and IVC. There are multiple rounded hypodense and hyperdense lesions within the bilateral kidneys, likely representing cysts with internal proteinaceous or hemorrhagic content, better visualized on CT 02/06/2019. No acute findings within the visualized upper abdomen. Musculoskeletal: Healed left fourth rib fracture. Chronic L1 vertebral body compression fracture. No acute osseous finding. Review of the MIP images confirms the above findings. IMPRESSION: 1. No evidence of pulmonary embolism. 2. Bibasilar atelectasis.  Lungs otherwise clear. 3. Reflux of contrast into the hepatic veins and IVC, which can be seen with right  heart failure. 4. Stable ascending thoracic aortic aneurysm measuring 4.0 cm in diameter. Recommend annual imaging followup by CTA or MRA. This recommendation follows 2010 ACCF/AHA/AATS/ACR/ASA/SCA/SCAI/SIR/STS/SVM Guidelines for the Diagnosis and Management of Patients with Thoracic Aortic Disease. Circulation. 2010; [id number]. Aortic aneurysm NOS (ICD10-I71.9) 5. Multiple bilateral renal cysts,  some of which are hyperdense, better visualized on CT 02/06/2019. 6. Chronic L1 vertebral body compression fracture. Electronically Signed   By: Davina Poke D.O.   On: 02/28/2020 20:13   VAS [place] LOWER EXTREMITY VENOUS (DVT) (ONLY [facility] & [facility])  Result Date: 02/28/2020  Lower Venous DVTStudy Indications: Edema.  Comparison Study: 01/27/19 previous Performing Technologist: Abram Sander RVS  Examination Guidelines: A complete evaluation includes B-mode imaging, spectral Doppler, color Doppler, and power Doppler as needed of all accessible portions of each vessel. Bilateral testing is considered an integral part of a complete examination. Limited examinations for reoccurring indications may be performed as noted. The reflux portion of the exam is performed with the patient in reverse Trendelenburg.  +-----+---------------+---------+-----------+----------+--------------+ RIGHTCompressibilityPhasicitySpontaneityPropertiesThrombus Aging +-----+---------------+---------+-----------+----------+--------------+ CFV  Full           Yes      Yes                                 +-----+---------------+---------+-----------+----------+--------------+   +---------+---------------+---------+-----------+----------+--------------+ LEFT     CompressibilityPhasicitySpontaneityPropertiesThrombus Aging +---------+---------------+---------+-----------+----------+--------------+ CFV      Full           Yes      Yes                                  +---------+---------------+---------+-----------+----------+--------------+ SFJ      Full                                                        +---------+---------------+---------+-----------+----------+--------------+ FV Prox  Full                                                        +---------+---------------+---------+-----------+----------+--------------+ FV Mid   Full                                                        +---------+---------------+---------+-----------+----------+--------------+ FV DistalFull                                                        +---------+---------------+---------+-----------+----------+--------------+ PFV      Full                                                        +---------+---------------+---------+-----------+----------+--------------+ POP      Full           Yes      Yes                                 +---------+---------------+---------+-----------+----------+--------------+  PTV      Full                                                        +---------+---------------+---------+-----------+----------+--------------+ PERO                                                  Not visualized +---------+---------------+---------+-----------+----------+--------------+     Summary: RIGHT: - No evidence of common femoral vein obstruction.  LEFT: - There is no evidence of deep vein thrombosis in the lower extremity.  - No cystic structure found in the popliteal fossa.  *See table(s) above for measurements and observations.    Preliminary     Procedures Procedures (including critical care time)  Medications Ordered in ED Medications  iohexol (OMNIPAQUE) 350 MG/ML injection 100 mL (100 mLs Intravenous Contrast Given 02/28/20 1958)    ED Course  I have reviewed the triage vital signs and the nursing notes.  Pertinent labs & imaging results that were available during my care of the patient were reviewed by  me and considered in my medical decision making (see chart for details).    MDM Rules/Calculators/A&P                      Jill Castaneda is a 86 y.o. female here presenting with left leg swelling and shortness of breath.  Patient has a history of thrombocytosis and PE and is at risk for DVT and PEs again. Patient is not on anticoagulation.  Also consider ACS or heart failure given the shortness of breath.  Will get CBC, CMP, troponin, BNP, chest x-ray, CTA chest, DVT study of the left leg.   10:19 PM DVT study showed no DVT.  CTA showed no PE .  Initial troponin was 34 and went down to 27. I think likely demand ischemia from hypertension.  Of note, her CBC showed platelet count of 1100K which is stable from previous.  Also she has a thoracic aneurysm that is stable. She is hypertensive in the ED but did not take her blood pressure medicine.  At this point, I think she is stable for discharge .  I recommend that she keeps her leg elevated and use compression stockings.  Recommend repeat CBC with oncology outpatient and recheck blood pressure with PCP  Final Clinical Impression(s) / ED Diagnoses Final diagnoses:  None    Rx / DC Orders ED Discharge Orders    None       Drenda Freeze, MD 02/28/20 2220

## 2020-02-28 NOTE — Progress Notes (Signed)
Lower extremity venous has been completed.   Preliminary results in CV Proc.   Abram Sander 02/28/2020 6:13 PM

## 2020-02-28 NOTE — Discharge Instructions (Addendum)
You have left leg swelling the ultrasound today did not show a blood clot. please use compression stockings and keep leg elevated  Your blood pressure is elevated so please take your blood pressure medicines as prescribed.  You have a aneurysm in your aorta that has not changed in size since previous imaging.  See your doctor for follow-up to recheck your blood pressure and recheck your blood count.  Return to ER if you have worse chest pain, shortness of breath, leg swelling, abdominal pain.

## 2020-02-28 NOTE — ED Triage Notes (Signed)
Patient accompanied by granddaughter; here from urgent care with c/o left leg swelling

## 2020-02-29 LAB — PATHOLOGIST SMEAR REVIEW

## 2021-06-01 DEATH — deceased
